# Patient Record
Sex: Male | Born: 1935
Health system: Southern US, Community
[De-identification: ages and names within clinical notes are randomized; demographics above are authoritative.]

## PROBLEM LIST (undated history)

## (undated) DIAGNOSIS — I639 Cerebral infarction, unspecified: Secondary | ICD-10-CM

## (undated) DIAGNOSIS — C9110 Chronic lymphocytic leukemia of B-cell type not having achieved remission: Secondary | ICD-10-CM

## (undated) DIAGNOSIS — C61 Malignant neoplasm of prostate: Secondary | ICD-10-CM

## (undated) DIAGNOSIS — R0602 Shortness of breath: Secondary | ICD-10-CM

## (undated) DIAGNOSIS — M1990 Unspecified osteoarthritis, unspecified site: Secondary | ICD-10-CM

## (undated) DIAGNOSIS — C439 Malignant melanoma of skin, unspecified: Secondary | ICD-10-CM

## (undated) DIAGNOSIS — I10 Essential (primary) hypertension: Secondary | ICD-10-CM

## (undated) DIAGNOSIS — E119 Type 2 diabetes mellitus without complications: Secondary | ICD-10-CM

## (undated) DIAGNOSIS — E7800 Pure hypercholesterolemia, unspecified: Secondary | ICD-10-CM

## (undated) HISTORY — DX: Type 2 diabetes mellitus without complications: E11.9

## (undated) HISTORY — PX: PROSTATE SURGERY: SHX751

## (undated) HISTORY — DX: Malignant melanoma of skin, unspecified: C43.9

## (undated) HISTORY — DX: Pure hypercholesterolemia, unspecified: E78.00

## (undated) HISTORY — DX: Shortness of breath: R06.02

## (undated) HISTORY — PX: BYPASS GRAFT: SHX909

## (undated) HISTORY — DX: Unspecified osteoarthritis, unspecified site: M19.90

## (undated) HISTORY — DX: Essential (primary) hypertension: I10

## (undated) HISTORY — DX: Malignant neoplasm of prostate: C61

## (undated) HISTORY — PX: CATARACT EXTRACTION: SUR2

## (undated) HISTORY — DX: Cerebral infarction, unspecified: I63.9

---

## 1999-05-19 ENCOUNTER — Encounter (INDEPENDENT_AMBULATORY_CARE_PROVIDER_SITE_OTHER): Payer: Self-pay | Admitting: *Deleted

## 1999-05-19 ENCOUNTER — Ambulatory Visit (HOSPITAL_BASED_OUTPATIENT_CLINIC_OR_DEPARTMENT_OTHER): Admission: RE | Admit: 1999-05-19 | Discharge: 1999-05-19 | Payer: Self-pay | Admitting: *Deleted

## 1999-05-25 ENCOUNTER — Encounter: Payer: Self-pay | Admitting: *Deleted

## 1999-05-25 ENCOUNTER — Inpatient Hospital Stay (HOSPITAL_COMMUNITY): Admission: RE | Admit: 1999-05-25 | Discharge: 1999-05-28 | Payer: Self-pay | Admitting: *Deleted

## 1999-06-23 ENCOUNTER — Encounter (INDEPENDENT_AMBULATORY_CARE_PROVIDER_SITE_OTHER): Payer: Self-pay | Admitting: *Deleted

## 1999-06-23 ENCOUNTER — Ambulatory Visit (HOSPITAL_BASED_OUTPATIENT_CLINIC_OR_DEPARTMENT_OTHER): Admission: RE | Admit: 1999-06-23 | Discharge: 1999-06-24 | Payer: Self-pay | Admitting: *Deleted

## 2006-11-21 ENCOUNTER — Ambulatory Visit: Payer: Self-pay | Admitting: Cardiovascular Disease

## 2006-12-06 ENCOUNTER — Encounter: Payer: Self-pay | Admitting: Cardiovascular Disease

## 2006-12-06 ENCOUNTER — Ambulatory Visit: Payer: Self-pay

## 2006-12-06 ENCOUNTER — Encounter: Payer: Self-pay | Admitting: Cardiology

## 2008-01-16 ENCOUNTER — Ambulatory Visit: Payer: Self-pay | Admitting: Vascular Surgery

## 2008-01-21 ENCOUNTER — Ambulatory Visit: Payer: Self-pay | Admitting: Vascular Surgery

## 2008-01-21 ENCOUNTER — Ambulatory Visit (HOSPITAL_COMMUNITY): Admission: RE | Admit: 2008-01-21 | Discharge: 2008-01-21 | Payer: Self-pay | Admitting: Vascular Surgery

## 2008-01-22 ENCOUNTER — Inpatient Hospital Stay (HOSPITAL_COMMUNITY): Admission: RE | Admit: 2008-01-22 | Discharge: 2008-01-25 | Payer: Self-pay | Admitting: Vascular Surgery

## 2008-01-23 ENCOUNTER — Ambulatory Visit: Payer: Self-pay | Admitting: Thoracic Surgery

## 2008-01-23 ENCOUNTER — Encounter: Payer: Self-pay | Admitting: Vascular Surgery

## 2008-02-22 ENCOUNTER — Ambulatory Visit: Payer: Self-pay | Admitting: Vascular Surgery

## 2008-04-16 ENCOUNTER — Ambulatory Visit: Payer: Self-pay | Admitting: Thoracic Surgery

## 2008-04-16 ENCOUNTER — Encounter: Admission: RE | Admit: 2008-04-16 | Discharge: 2008-04-16 | Payer: Self-pay | Admitting: Thoracic Surgery

## 2008-04-25 ENCOUNTER — Ambulatory Visit: Payer: Self-pay | Admitting: Vascular Surgery

## 2009-11-20 IMAGING — CT CT CHEST W/ CM
2 of 4 series · 15 of 36 positions shown, 18 images · IV contrast (APPLIED)
Comparison: No priors CT.  01/22/2008.

CLINICAL DATA: Lung nodule on preop chest x-ray

CT CHEST WITH CONTRAST
TECHNIQUE: Multidetector CT imaging of the chest was performed
following the standard protocol during bolus administration of
intravenous contrast.
Contrast: 80 ml [id number]

[Series 2: routine chest 5.0 st · axial · 0.68mm/px · z∈[-282,-42]mm · 12 of 57 slices shown, 15 images]
[im 5/57  mediastinal]
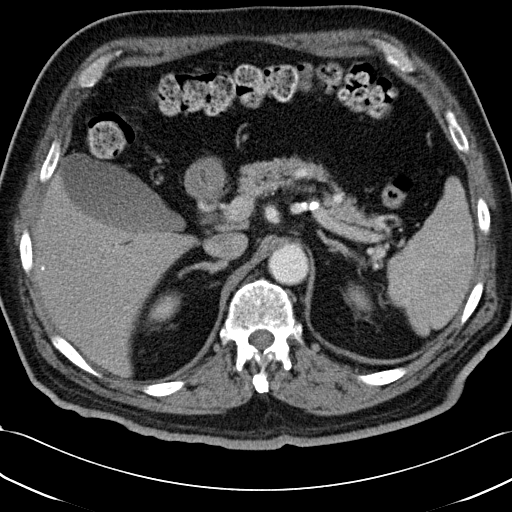
[im 5/57  lung]
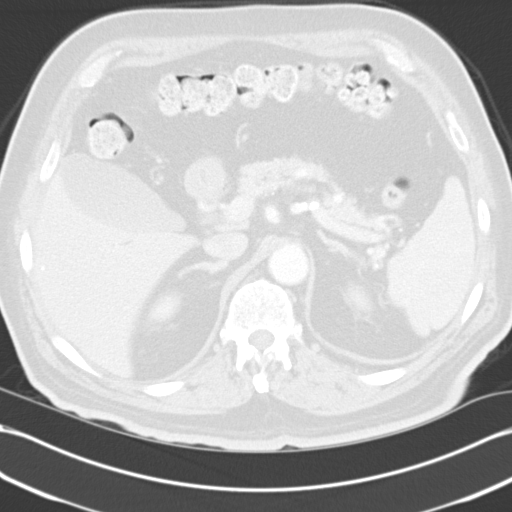
[im 9/57  lung]
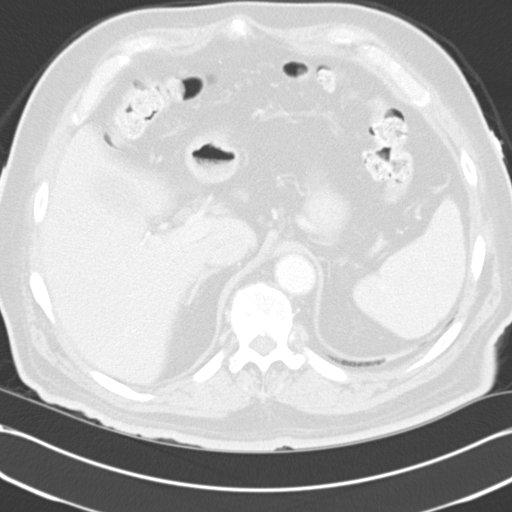
[im 13/57  lung]
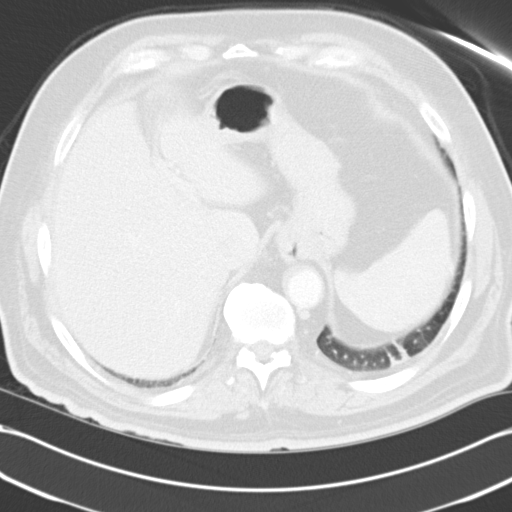
[im 17/57  lung]
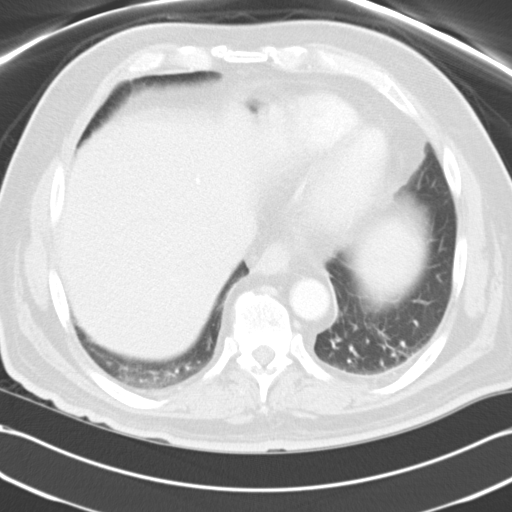
[im 21/57  mediastinal]
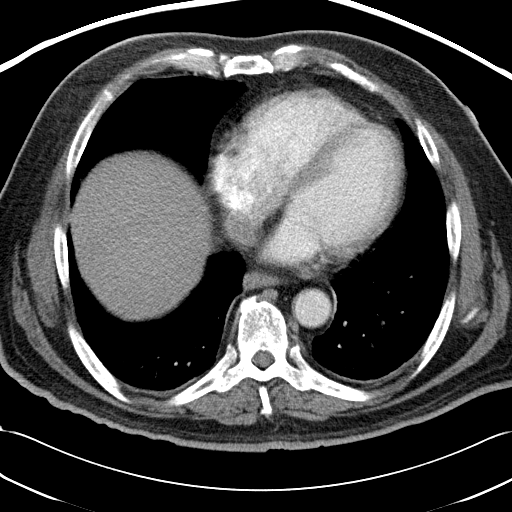
[im 21/57  lung]
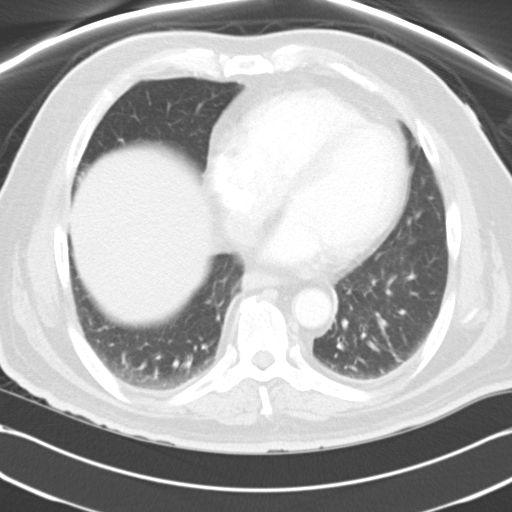
[im 25/57  lung]
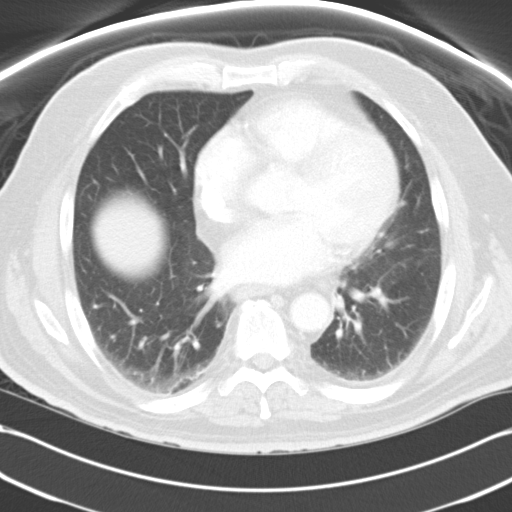
[im 33/57  lung]
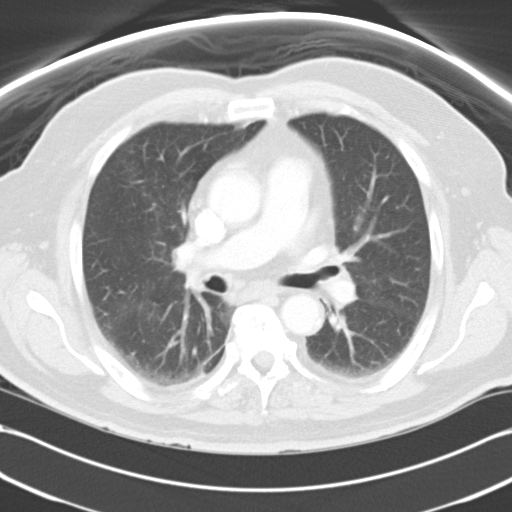
[im 37/57  lung]
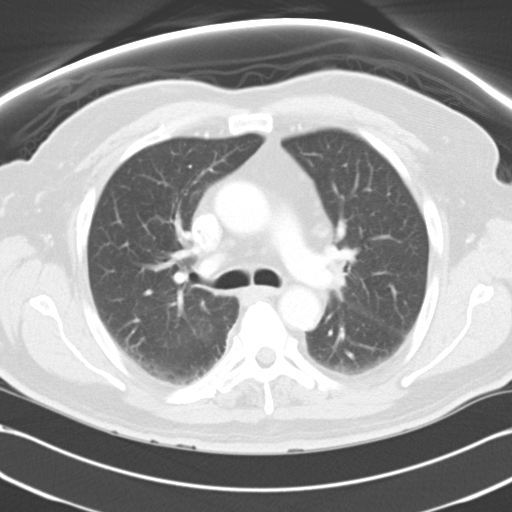
[im 41/57  mediastinal]
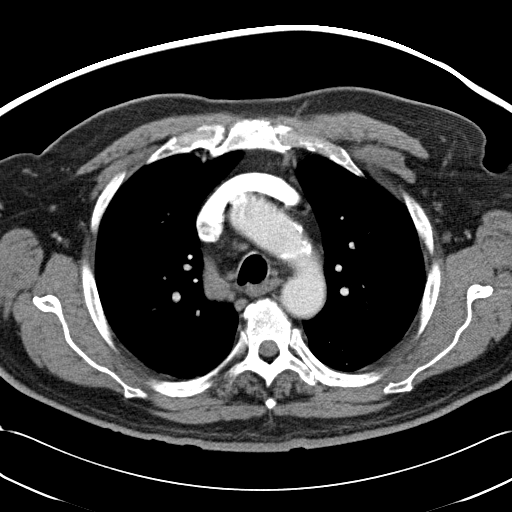
[im 41/57  lung]
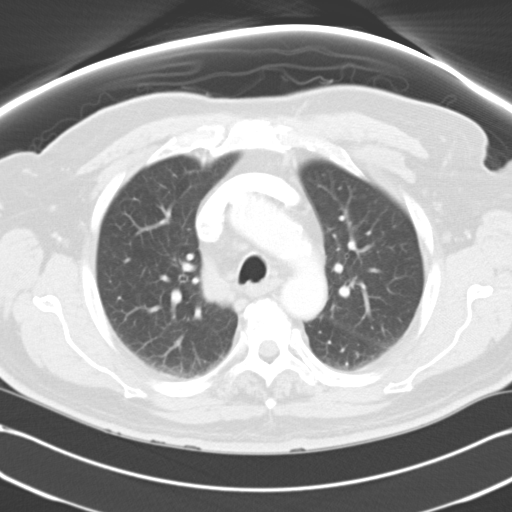
[im 45/57  lung]
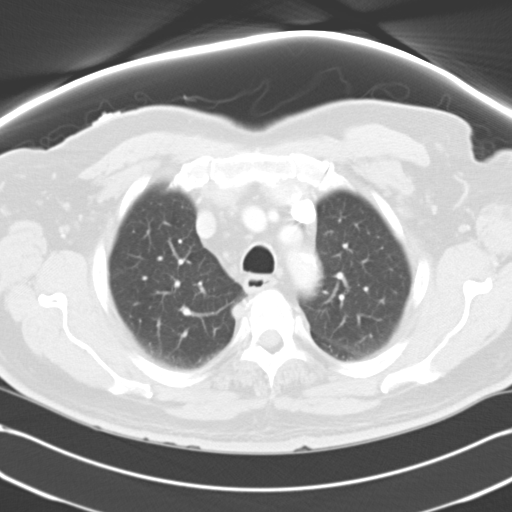
[im 49/57  lung]
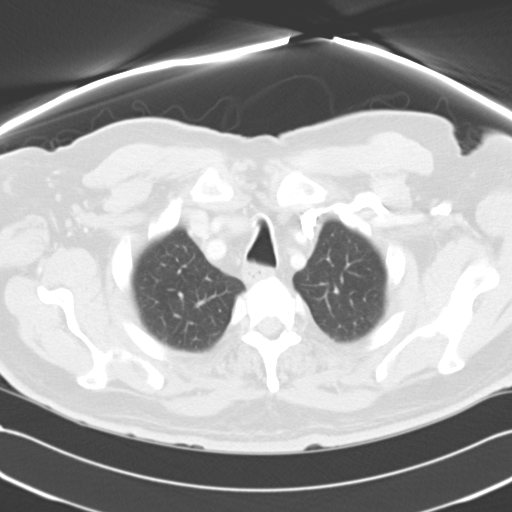
[im 53/57  lung]
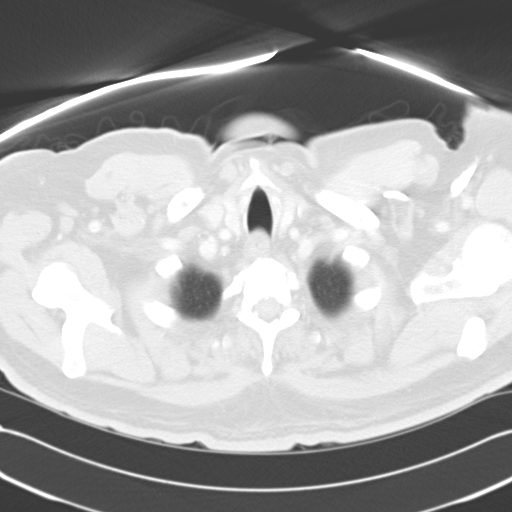

[Series 5: routine chest 2.0 st · coronal · 0.66mm/px · 3 of 106 slices shown]
[im 22/106  lung]
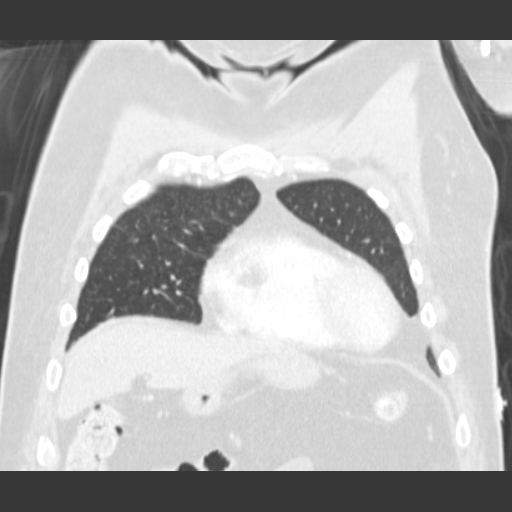
[im 43/106  lung]
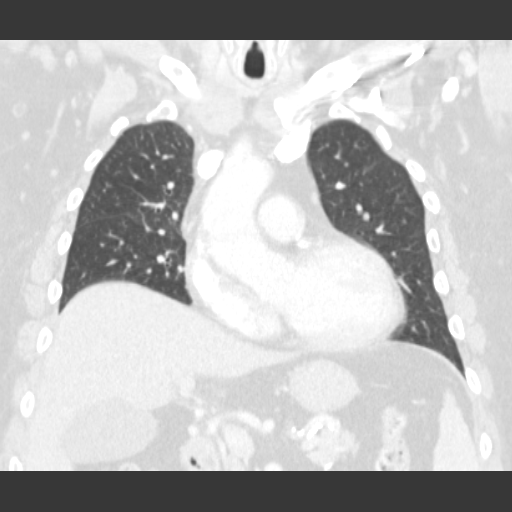
[im 64/106  lung]
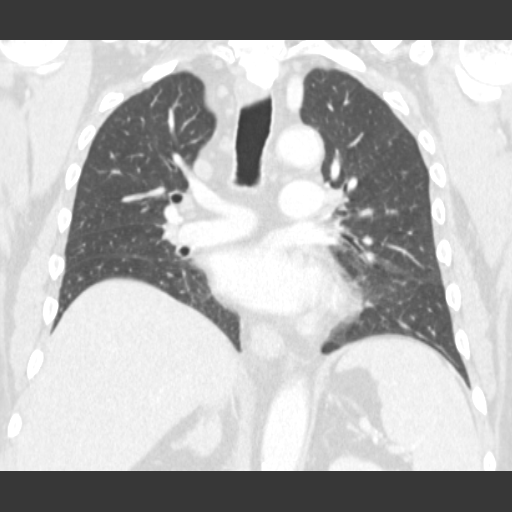

[15 of 36 positions shown; findings below may reference images not displayed]

FINDINGS: There is no 10 mm nodule correlating with the chest x-ray
findings.  There is, however, and 4 mm nodule in the superior
segment of the right lower lobe, seen on image number 27 of series
3.  In the sagittal plane, this is seen abutting the major fissure.
This is lower in position than the abnormality of the chest x-ray.
There are no other lung nodules.  There are some scattered sub
centimeter mediastinal nodes.  None are significantly enlarged.  No
pleural or pericardial fluid.  There are calcifications in the
coronary arteries and aorta.  There are degenerative changes of the
spine without skeletal lesions.

Presence of  the 4 mm lung nodule in the right lower lobe does
require follow-up.  If the patient is low risk for malignancy,
follow-up CT is recommended in 1 year.  If the patient is higher
risk for malignancy, follow-up is recommended at 6 months. The
follow-up study can be done without contrast.
IMPRESSION: 1.  There is no evidence for a 10 mm lung nodule as was questioned
on the recent chest x-ray.
2.  There is a 4 mm lung nodule in the superior segment of the
right lower lobe that does require follow-up, as outlined above.
3.  No other significant findings.

## 2010-03-08 ENCOUNTER — Encounter: Payer: Self-pay | Admitting: Thoracic Surgery

## 2010-06-29 NOTE — Assessment & Plan Note (Signed)
[facility]                            CARDIOLOGY OFFICE NOTE   MAGNUM, LUNDE                    MRN:          [id number]  DATE:11/21/2006                            DOB:          1928-05-10    [name] is a delightful 77 year old patient referred by Dr. Jacalyn Lefevre for an abnormal EKG and episode of chest pain or dyspnea and  hypercholesterolemia.   The patient is a longstanding diabetic for since at least 1993 or 1994.  He is a previous cigar smoker with hypercholesterolemia and  hypertension.  He has not had a previous cardiac workup.  The patient  had an episode about a month ago while he was sitting down watching TV  of pressure in his chest and shortness of breath.  The symptoms lasted  for approximately an hour. They resolved spontaneously.  He has not had  a recurrence.  The patient is fairly sedentary, but in general does not  get exertional dyspnea or chest pain.  He has not had any significant  palpitations or syncope.  He has also noted to have an abnormal EKG.  The patient is unaware of any previous cardiac problems, any previous  murmur or abnormal ECGs.   Despite being a previous cigar smoker, he does not carry a diagnosis of  COPD.  I do not have recent PFTs on him.  His diabetes has been under  good control.  His hemoglobin A1cs have been in the 6 range.  He has  been compliant with his blood pressure pills.  His primary symptom a  month ago was shortness of breath, although he did have a little  pressure in the center part of his chest.  There was no associated  diaphoresis or radiation.  It did not recur.  It has not been  progressive.  He does have some arthralgias that tend to occur in his  knee and ankle joints and this was distinctly different.   The patient's review of systems is otherwise negative.   His past medical history is remarkable for being on chronic Coumadin and  apparently he has had 2 previous  strokes and sees Dr. Sandria Manly on a regular  basis.  It is not clear to me why he is on long-term Coumadin; the  strokes do not necessarily sound ischemic or embolic.  His initial  episode 10-14 years ago involved an episode while he was drinking  alcohol, where he apparently lost his vision for a short period of time.  I told him I would follow up with Dr. Sandria Manly in regards to the followup of  this and need for chronic Coumadin.  He is also a diabetic, on insulin.  He has hypertension, has arthritis and takes Celebrex.   His past medical history is otherwise benign.   FAMILY HISTORY:  Remarkable for his father dying at age 75 of a blood  clot and mother dying at age 75 of a stroke.   SOCIAL HISTORY:  The patient is happily married.  He has 4 children that  are grown.  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  He is fairly sedentary.  He does not  smoke cigars since 1977.  He does not drink any more either.   CURRENT MEDICATIONS:  1. Lantus 10 units subcu every morning.  2. NovoLog 10 units in the morning as well.  3. Coumadin as directed, followed by Dr. Sandria Manly.  4. Glucotrol 10 mg a day.  5. Metformin 500 mg a day.  6. Lisinopril/hydrochlorothiazide 20/25.  7. Celebrex.   ALLERGIES:  CODEINE and TAPE.   PREVIOUS SURGICAL HISTORY:  1. Prostatectomy in 1994.  2. Finger removal for skin cancer on the right hand in 1998.   PHYSICAL EXAMINATION:  GENERAL:  His exam is remarkable for an elderly  white male in no distress.  His speech is somewhat halting.  Affect is  appropriate.  His blood pressure is 140/80.  His pulse is 68 and regular.  Respiratory  rate is 14.  He is afebrile.  His weight is 220.  HEENT:  Unremarkable.  NECK:  Supple.  There are no carotid bruits, no lymphadenopathy, no  thyromegaly and JVP elevation.  LUNGS:  Clear with diaphragmatic motion and no wheezing.  HEART:  Sounds are normal with an S1 and S2.  PMI is  normal.  ABDOMEN:  Benign.  Bowel sounds are positive.  No AAA.  No tenderness.  No hepatosplenomegaly.  No hepatojugular reflux.  Femorals are +3  bilaterally without bruit.  PTs are +2.  There is no lower extremity  edema.  He has had an amputation of the 4th digit on the right hand.  NEUROLOGIC:  Nonfocal.  There is no muscular weakness.  SKIN:  Warm and dry.   EKG:  Abnormal, sinus rhythm, left axis deviation, right bundle branch  block and a fairly pronounced first-degree block with a P-R interval of  324.   I have reviewed his records from [facility] including lab  work.  As indicated, his hemoglobin A1c has been in the 6 range.  He has  normal kidney function and no evidence of anemia.   IMPRESSION:  1. Episode of shortness of breath, unlikely to be anginal equivalent;      however, he has longstanding insulin-dependent diabetes and an      abnormal EKG.  He will be referred for stress Myoview.  I did      ambulate him in the halls today and I think he can walk, despite      his arthralgias.  2. Abnormal EKG showing trifascicular block.  He is not on any      atrioventricular nodal blocking drugs.  He has not had palpitations      or syncope.  All atrioventricular nodal blocking drugs including      calcium blockers, beta blockers and digoxin should be avoided.      This will have to be followed long-term.  I suspect it is a primary      electrical problem, but given his diabetes and episode of dyspnea,      we need to rule out an occult anterior wall myocardial infarction.      In light of this, he will also have a 2-D echocardiogram to assess      for previous silent myocardial infarction and to assess his      dyspnea.  3. Hypercholesterolemia.  Continue TriCor.  The patient initially      thought that the TriCor was related to  his shortness of breath.  I      do not have his full laboratory work, but being a diabetic, he      certainly probably is  hypertriglyceridemic and can continue TriCor;      it is not related to his breathing problem.  4. Hypertension, currently well controlled.  Given his diabetes, I      would continue his ACE inhibitor and continue low-salt diet.  5. Previous cigar smoking.  Continue abstinence.  No evidence of oral      cancer.  Follow up with primary care medical doctor.   Further recommendations will be based on the results of his Myoview and  echocardiogram.  As long as these are low risk, I will see him back in a  year.  Again, I would continue to be very vigilant about his conduction  system, since he does have trifascicular block.     Noralyn Pick. Eden Emms, MD, [facility]  Electronically Signed    PCN/MedQ  DD: 11/21/2006  DT: 11/22/2006  Job #: [id number]   cc:   Bertram Millard. Hyacinth Meeker, M.D.

## 2010-06-29 NOTE — Discharge Summary (Signed)
NAMEKIRKE, BREACH NO.:  [id number]   MEDICAL RECORD NO.:  [id number]          PATIENT TYPE:  INP   LOCATION:  2011                         FACILITY:  [facility]   PHYSICIAN:  Larina Earthly, M.D.    DATE OF BIRTH:  [birth year]   DATE OF ADMISSION:  01/22/2008  DATE OF DISCHARGE:  01/25/2008                               DISCHARGE SUMMARY   ADDENDUM   Mr. Bartoszek was found to have a solitary pulmonary nodule.  He has been  referred to Dr. Edwyna Shell, and Dr. Edwyna Shell is working to assess at present.  He is to have a followup CT scan in 3 months and Dr. Edwyna Shell will make  arrangements for his appointment at his office.      Wilmon Arms, PA      Larina Earthly, M.D.  Electronically Signed    KEL/MEDQ  D:  01/25/2008  T:  01/25/2008  Job:  [id number]

## 2010-06-29 NOTE — Op Note (Signed)
NAMEZIYAN, SCHOON             ACCOUNT NO.:  [id number]   MEDICAL RECORD NO.:  [id number]          PATIENT TYPE:  AMB   LOCATION:  SDS                          FACILITY:  [facility]   PHYSICIAN:  Di Kindle. Edilia Bo, M.D.DATE OF BIRTH:  [birth year]   DATE OF PROCEDURE:  DATE OF DISCHARGE:  01/21/2008                               OPERATIVE REPORT   PREOPERATIVE DIAGNOSIS:  Nonhealing wound on the left third toe with  infrainguinal arterial occlusive disease.   POSTOPERATIVE DIAGNOSIS:  Nonhealing wound on the left third toe with  infrainguinal arterial occlusive disease.   PROCEDURES:  1. Aortogram.  2. Bilateral iliac arteriogram.  3. Bilateral lower extremity runoff.  4. Ultrasound-guided access to the right common femoral artery.  5. Selective catheterization of the left external iliac artery.   SURGEON:  Di Kindle. Edilia Bo, MD   ANESTHESIA:  Local.   TECHNIQUE:  The patient was taken to the PV Lab and the groins were  prepped and draped in the usual sterile fashion.  After the skin was  infiltrated with 1% lidocaine and under ultrasound guidance, the right  common femoral artery was cannulated and a guidewire introduced into the  infrarenal aorta under fluoroscopic control.  A 5-French sheath was  introduced over the wire.  Pigtail catheter was positioned at the L1  vertebral body and flush aortogram obtained.  The catheter was then  repositioned above the aortic bifurcation and the catheter was exchanged  for an IMA catheter, which was positioned into the left common iliac  artery.  A Wholey wire was advanced down into the left common femoral  artery and then the IMA catheter exchanged for an end-hole catheter.  Selective left external iliac arteriogram was obtained with left lower  extremity runoff.  Additional spot films obtained of the left leg.  The  end-hole catheter was then removed and additional right lower extremity  films obtained to the right femoral  sheath.   FINDINGS:  There are single renal arteries bilaterally with no  significant renal artery stenosis identified.  The infrarenal aorta is  widely patent.  The common iliac, external iliac, and hypogastric  arteries are patent bilaterally.   On the left side, common femoral, proximal superficial femoral, and deep  femoral arteries are patent.  There is a short focal 80% stenosis in the  proximal left superficial femoral artery at the junction of the proximal  and middle third of the thigh.  The superficial femoral artery below  this is patent as is the popliteal artery.  The popliteal artery below  the knee is then occluded with some moderate disease of the below-knee  popliteal artery.  The anterior tibial, tibioperoneal trunk, and  proximal peroneal and posterior tibial arteries were occluded.  The  entire anterior tibial artery is occluded.  The peroneal artery  reconstitutes in the proximal third of the leg and it is patent to the  ankle.  There is reconstitution of the posterior tibial artery and the  distal third of the leg with some mild disease in the distal posterior  tibial artery and the foot.   On  the right side, the common femoral, superficial femoral, and deep  femoral arteries are patent.  The popliteal artery is occluded below the  knee.  All three tibials were occluded except the peroneal reconstitutes  proximally and then there is collateral filling of the dorsalis pedis  artery on the right.   CONCLUSIONS:  1. No significant aortoiliac occlusive disease.  2. An 80% left superficial femoral artery stenosis, which is local.  3. Severe tibial occlusive disease as described above.      Di Kindle. Edilia Bo, M.D.  Electronically Signed     CSD/MEDQ  D:  01/21/2008  T:  01/21/2008  Job:  [id number]   cc:   Fanny Bien. Tuchman, D.P.M.  Bertram Millard. Hyacinth Meeker, M.D.  Larina Earthly, M.D.

## 2010-06-29 NOTE — Consult Note (Signed)
Brandon Chambers, Brandon Chambers NO.:  [id number]   MEDICAL RECORD NO.:  [id number]          PATIENT TYPE:  INP   LOCATION:  2011                         FACILITY:  [facility]   PHYSICIAN:  Ines Bloomer, M.D. DATE OF BIRTH:  1928/05/23   DATE OF CONSULTATION:  DATE OF DISCHARGE:                                 CONSULTATION   CHIEF COMPLAINT:  Lung nodule.   This 77 year old diabetic was having a nonhealing ulcer of his left  third toe, was seen by Dr. Leeanne Deed, and was referred to Dr. Arbie Cookey and  had surgery of left popliteal tibial bypass with debridement of the left  third toe, was placed on antibiotics.  A preop chest x-ray showed a  right upper lobe nodule apparently back in 2001 when Dr. Metro Kung  amputated a right fourth finger for some kind of a malignant tumor.  A  chest x-ray showed a questionable nodule in the right upper lobe with  calcifications.  He quit smoking in 1977.  He has had no hemoptysis,  fever, chills, excessive sputum.   His past medical history is significant for diabetes.   He has allergies to CODEINE.   His medications include lisinopril.  He was on Coumadin but that has  been stopped since he has been in the hospital, insulin Lantus, NovoLog.  He also has taken Augmentin, Celebrex, hydrochlorothiazide, Lovenox.  His Coumadin is being restarted.   Family history is noncontributory.   SOCIAL HISTORY:  He is married.  He has four children.  Retired.  Quit  smoking in 1977.  Does not drink alcohol.   REVIEW OF SYSTEMS:  He is 214 pounds, he is 6 feet 4 inches.  CARDIAC:  No angina or atrial fibrillation.  PULMONARY:  See history of present  illness.  No hemoptysis.  GI:  No nausea, vomiting, constipation, or  diarrhea.  GU:  No dysuria or frequent urination.  VASCULAR:  See  history of present illness.  No DVT or TIAs.  MUSCULOSKELETAL:  He has  got degenerative arthritis.  HEMATOLOGIC:  No problems with bleeding,  clotting disorders, or  anemia, although he is on Coumadin.  EYE/ENT:  No  changes in eyesight or hearing.  PSYCHIATRIC:  No problems with  nerves,  depression or other psychiatric illnesses.   PHYSICAL EXAMINATION:  GENERAL:  He is a well-developed Caucasian male  in no acute distress.  VITAL SIGNS:  Blood pressure is 120/70, pulse 70, respirations 18, sats  were 94%.  HEAD, EYES, EARS, NOSE, and THROAT:  Unremarkable.  NECK:  Supple without thyromegaly.  There is no supraclavicular or  axillary adenopathy.  CHEST:  Clear to auscultation and percussion.  HEART:  Regular sinus rhythm, no murmurs.  ABDOMEN:  Soft.  There is no hepatosplenomegaly.  Bowel sounds are  normal.  EXTREMITIES:  Pulses are 2+.  There is no clubbing or edema.  His left  leg has surgical incisions, and there is __________.  Also on the right,  fourth finger is surgically removed as well healed amputation.  NEUROLOGIC:  He is oriented x3.  Sensory and motor intact.  Cranial  nerves intact.   IMPRESSION:  1. Right upper lobe nodule probably granuloma.  2. Diabetes mellitus, insulin-dependent.  3. Hypertension.  4. Ulceration, left foot, left third toe with popliteal tibial bypass      and debridement.   PLAN:  CT scan and possible PET scan.      Ines Bloomer, M.D.  Electronically Signed     DPB/MEDQ  D:  01/23/2008  T:  01/23/2008  Job:  [id number]

## 2010-06-29 NOTE — Assessment & Plan Note (Signed)
OFFICE VISIT   Brandon Chambers, Brandon Chambers  DOB:  May 03, 1928                                       02/22/2008  ZOXWR#:[id number]   The patient presents today for follow-up of his left below-knee  popliteal-to-peroneal bypass with reversed great saphenous vein.  This  was on January 22, 2008.  He had infection in his third toe.  He did  well in the hospital and was discharged to home.  He has healed all of  his surgical incisions and his third toe looks quite good as well.  He  has biphasic perineal and anterior tibial and posterior tibial signals.  His foot is well-perfused.  He does have the usual amount of  postoperative swelling.  I discussed continued recovery with the  patient.  He will see [place] again in our vascular lab protocol.  He does  have a known focal superficial femoral artery stenosis as well and we  will continue to follow this as well.  This would be amenable to  angioplasty, but did not appear to be flow-limiting since he has a  normal popliteal pulse.   Larina Earthly, M.D.  Electronically Signed   TFE/MEDQ  D:  02/22/2008  T:  02/25/2008  Job:  [id number]   cc:   Bertram Millard. Hyacinth Meeker, M.D.  Richard C. Tuchman, D.P.M.

## 2010-06-29 NOTE — Discharge Summary (Signed)
Brandon Chambers, Brandon Chambers NO.:  [id number]   MEDICAL RECORD NO.:  [id number]          PATIENT TYPE:  INP   LOCATION:  2011                         FACILITY:  [facility]   PHYSICIAN:  Larina Earthly, M.D.    DATE OF BIRTH:  October 01, 1928   DATE OF ADMISSION:  01/22/2008  DATE OF DISCHARGE:  01/25/2008                               DISCHARGE SUMMARY   DISCHARGE DIAGNOSES:  1. Left foot ischemia with poorly healing left third toe.  2. Peripheral vascular disease.  3. Diabetes mellitus.  4. High blood pressure.  5. Dyslipidemia.   PROCEDURE PERFORMED:  Left below-knee popliteal to peroneal bypass using  reversed greater saphenous vein by Dr. Arbie Cookey on January 22, 2008.   COMPLICATIONS:  None.   CONDITION ON DISCHARGE:  Stable and improving.   DISCHARGE MEDICATIONS:  1. NovoLog 12 units subcu q.a.m.  2. Lantus insulin 32 units subcu q.p.m.  3. Lisinopril and hydrochlorothiazide 20/25 p.o. daily.  4. Amoxicillin 875/125 mg p.o. b.i.d.  5. Coumadin 5 mg p.o. daily.  6. Celebrex 200 mg p.o. daily.  7. He is given a prescription for Percocet 5/325 one p.o. q.4 h.      p.r.n. pain.   DISPOSITION:  He is being discharged home in stable condition with his  wounds healing well.  He is given careful instructions regarding care of  his wounds and his activity level.  He is instructed to have his blood  checked on Monday and his Coumadin will be adjusted at that time.  He is  to see Dr. Arbie Cookey in 2 weeks with ABI's and for followup.   BRIEF IDENTIFYING STATEMENT:  For complete details, please refer to the  typed history and physical.  Briefly, this very pleasant 77 year old  gentleman was referred to Dr. Arbie Cookey with a nonhealing left-third toe  ulceration with slight erythema.  Dr. Arbie Cookey evaluated him and found him  to have high-grade stenosis throughout his iliac system and less at the  superficial femoral artery.  There was good occlusion of all tibial  vessels with distal  reconstitution of the peroneal artery at mid calf,  which rendered collateral flow to the posterior tibial artery.  Dr.  Arbie Cookey recommended a popliteal to below-knee peroneal bypass.  [name] was informed of the risks and benefits of the procedure and  after careful consideration, he elected to proceed with surgery.   HOSPITAL COURSE:  Preoperative workup was completed as an outpatient.  He was brought in through same-day surgery and underwent the  aforementioned revascularization procedure.  For complete details,  please refer the typed operative report.  The procedure was without  complications.  He was returned to the Post Anesthesia Care Unit  extubated.  Following stabilization, he was transferred to a bed on a  surgical convalescent floor.  His diet and activity level were advanced  as tolerated.  On January 25, 2008, he was evaluated and was desirous  of discharge.  He was found to be stable and was subsequently discharged  home.       Wilmon Arms, PA  Larina Earthly, M.D.  Electronically Signed    KEL/MEDQ  D:  01/25/2008  T:  01/25/2008  Job:  [id number]   cc:   Larina Earthly, M.D.

## 2010-06-29 NOTE — Letter (Signed)
April 16, 2008   Larina Earthly, MD  [address]  [place], [place] [address]   Re:  Brandon Chambers, Brandon Chambers             DOB:  08-17-1928   Dear Tawanna Cooler,   The patient came for followup today for this 4-mm right lower lobe  nodule.  We got a CT scan on him and it was unchanged.  He is doing well  from the standpoint of his leg.  His blood pressure was 146/83, pulse  62, respirations 18, and sats were 95%.  I will see him back again in 4  weeks with a chest x-ray.   Ines Bloomer, M.D.  Electronically Signed   DPB/MEDQ  D:  04/16/2008  T:  04/16/2008  Job:  [id number]

## 2010-06-29 NOTE — Procedures (Signed)
BYPASS GRAFT EVALUATION   INDICATION:  Left popliteal-to-peroneal artery bypass graft.   HISTORY:  Diabetes:  Yes.  Cardiac:  No.  Hypertension:  Yes.  Smoking:  Former smoker.  Previous Surgery:  Left pop-to-peroneal artery bypass graft with reverse  greater saphenous vein by Dr. Arbie Cookey on January 22, 2008.   SINGLE LEVEL ARTERIAL EXAM                               RIGHT              LEFT  Brachial:                    158                158  Anterior tibial:             >300               160  Posterior tibial:                               160  Peroneal:                    >300  Ankle/brachial index:        Non-compressible   >1.0   PREVIOUS ABI:  Date: Preop  RIGHT:  Non-compressible  LEFT:  >1.0   LOWER EXTREMITY BYPASS GRAFT DUPLEX EXAM:   DUPLEX:  1. Toe pressure right 76 mmHg, left 50 mmHg.  2. Doppler arterial waveforms are biphasic proximal to the bypass      graft and monophasic within and distal to the bypass graft.   IMPRESSION:  1. Right ABI was not calculated secondary to non-compressible vessels.  2. Left ABI was stable to previous study, however may be falsely      elevated secondary to medial calcification.  3. Patent left popliteal to peroneal artery bypass graft.   ___________________________________________  Larina Earthly, M.D.   MC/MEDQ  D:  04/25/2008  T:  04/25/2008  Job:  [id number]

## 2010-06-29 NOTE — Consult Note (Signed)
NEW PATIENT CONSULTATION   Brandon Chambers, Brandon Chambers  DOB:  1928-06-25                                       01/16/2008  HYQMV#:[id number]   The patient presents today for evaluation of left third toe ulceration.  He is a very pleasant 77 year old diabetic gentleman with a several week  history of nonhealing ulcer of his left third toe.  He has seen Dr.  Leeanne Deed who has had appropriate local care and I am seeing him for  further evaluation with concern regarding arterial insufficiency.  He  does not have any prior history of claudication or tissue loss.  He did  have some nonhealing ulcerations over the plantar aspect of his foot  several years ago which eventually recovered.  He recalls having  noninvasive studies at that time revealing mild to moderate  insufficiency.  He does not have any specific pain related to the  ulceration currently.  He has been on antibiotics.   PAST MEDICAL HISTORY:  Significant for diabetes for approximately 15  years, does not have any cardiac history and does not have any history  of renal insufficiency.   SOCIAL HISTORY:  He is married with four children.  He is retired.  He  quit smoking 32 years ago.  He does not drink alcohol.   REVIEW OF SYSTEMS:  His weight is reported at 214 pounds.  He is 6 feet  tall.  He has no history of cardiac, pulmonary, GU or GI difficulties.  He does have a history of arthritis.   ALLERGIES:  Codeine.   MEDICATION:  Lisinopril, Coumadin, insulin, Lantus, NovoLog.  He is also  on an oral antibiotic for the last several days.  His Coumadin is due to  prior posterior circulation stroke and this is regulated by Dr. Sandria Manly and  he reports it is typically between 2 and 3 INR.   PHYSICAL EXAMINATION:  General:  A well-developed, well-nourished white  male appearing stated age of 9.  Vital signs:  Blood pressure 127/67,  pulse 87, respirations 18, temperature is 98.4.  He is grossly intact  neurologically.   His radial pulses are 2+.  He has 2+ femoral, 2+  popliteal pulses.  On the right he has a 1+ dorsalis pedis pulse and no  posterior tibial pulse.  On the left he does not have a palpable  posterior tibial pulse or anterior tibial pulse.  He does have erythema  extending up onto the dorsum of his foot and has ulceration over the tip  of his third toe and also at the distal joints.   He underwent noninvasive vascular laboratory studies in our office today  and this reveals a monophasic flow in his left tibial vessels and  biphasic flow in his right tibial vessels.  He does have calcified  vessels making his ankle arm index unreliable.  I discussed this at  length with the patient.  I feel that he does have limb threatening  level of ischemia.  He is on Coumadin and therefore he will stop this  after today's dose and will undergo arteriography on Monday.  He  understands that he in all likelihood will require revascularization for  improvement of flow to be able to heel toe amputation.  We will make  further recommendations pending his arteriogram on Monday.   Larina Earthly, M.D.  Electronically Signed   TFE/MEDQ  D:  01/16/2008  T:  01/17/2008  Job:  [id number]   cc:   Fanny Bien. Tuchman, D.P.M.  Bertram Millard. Hyacinth Meeker, M.D.

## 2010-06-29 NOTE — Op Note (Signed)
NAMEDAVIAN, HANSHAW NO.:  [id number]   MEDICAL RECORD NO.:  [id number]          PATIENT TYPE:  INP   LOCATION:  2011                         FACILITY:  [facility]   PHYSICIAN:  Larina Earthly, M.D.    DATE OF BIRTH:  Apr 03, 1928   DATE OF PROCEDURE:  01/22/2008  DATE OF DISCHARGE:                               OPERATIVE REPORT   PREOPERATIVE DIAGNOSIS:  Left foot ischemia with poorly healing left  third toe.   POSTOPERATIVE DIAGNOSIS:  Left foot ischemia with poorly healing left  third toe.   PROCEDURE:  Left below-knee popliteal-to-peroneal bypass with reversed  great saphenous vein.   SURGEON:  Larina Earthly, MD   ASSISTANT:  Wilmon Arms, PA-C   ANESTHESIA:  General endotracheal.   COMPLICATIONS:  None.   DISPOSITION:  To recovery room, stable.   INDICATIONS FOR PROCEDURE:  The patient is a 77 year old gentleman who  presented last week to my office with nonhealing left third toe  ulcerations and erythema of his foot.  He had been placed on antibiotics  and Coumadin was discontinued at that time and he underwent  arteriography the day prior to this procedure.  He did have 2 to 3+  popliteal pulse on the left and no pedal pulses.  His arteriogram  revealed patency throughout his iliac system.  His left superficial  femoral artery and popliteal artery were patent.  He did have a high-  grade focal stenosis in the mid superficial femoral artery.  There was  complete occlusion of all tibial vessels with reconstitution of the  peroneal artery and mid calf, which then gave collateral flow into the  posterior tibial artery.  I discussed this at length with Mr. Sweeney  and recommended below-knee popliteal-to-peroneal bypass.  I did explain  that he did have a stenosis of the superficial femoral artery with a  palpable below-knee popliteal pulse, so that he had adequate inflow for  pop-peroneal bypass.  I did explain the option for angioplasty but  concerned about early recurrence threatening his bypass.  He understands  and wished to proceed.  Also of note, the preoperative arteriogram was  found to have a right mid lung 1-cm nodule.  I did discuss this with Mr.  Rolfson and his family in the holding area since his preoperative x-ray  was the morning of the procedure.  I explained that we would work this  up further after his limb-threatening ischemia was resolved.   PROCEDURE IN DETAIL:  The patient was taken to the operating room and  placed in supine position.  The area of the left groin and left leg  prepped and draped in the usual sterile fashion.  Using ultrasound, the  level of the saphenous vein was identified and was of good caliber.  Incision was made over the mid calf over the saphenous vein and the  incision was continued from mid-to-distal calf to the above-knee  position.  The vein was of excellent caliber.  Tributary branches were  ligated with 3-0 and 4-0 silk ties and divided.  The fascia was opened  at the  medial aspect of the calf through the same incision, carried down  to isolate the popliteal artery in the below-knee position.  Dissection  was carried up under the knee and the artery had mild-to-moderate  atherosclerotic changes but had normal pulse.  The gastrocnemius muscle  was reflected posteriorly to give exposure of the posterior tibial and  peroneal arteries.  The peroneal artery was very calcified but did have  a flow lumen.  The peroneal vein was dissected away from the peroneal  artery.  The saphenous vein was harvested by ligating it proximally,  distally, and dividing it.  The vein was gently dilated and was of  excellent caliber.  The patient was given 9000 units of intravenous  heparin.  After adequate circulation time, the popliteal artery was  occluded proximally, distally, and was opened with 11 blade extended  longitudinally using Potts scissors.  The vein was reversed, spatulated,  and sewn  end-to-side with popliteal artery with a running 6-0 Prolene  suture.  The anastomosis was tested and found to be adequate.  There was  excellent flow through the vein graft.  The vein was then brought into  approximation of the peroneal artery.  The Webril pneumatic tourniquet  were placed in the mid thigh on the left.  The leg was elevated and  exsanguinated with Esmarch tourniquet, and the pneumatic tourniquet was  inflated to 250 mmHg.  The peroneal artery was opened, and there  continued to be some flow in the peroneal artery despite the tourniquet.  For this reason, the peroneal artery was occluded proximally and  distally with serrefine clamps.  The peroneal artery was opened  longitudinally and did have a 2-mm flow channel that was moderately  atherosclerotic.  The saphenous vein graft was cut to the appropriate  length, was spatulated, sewn end-to-side with the peroneal artery with a  running 6-0 Prolene suture.  Prior to completion, a dilator passed  easily through the distal anastomosis.  The anastomosis was then  completed.  After the usual flushing maneuvers, the pneumatic tourniquet  was deflated and excellent graft-dependent flow was noted at the foot at  the level of the posterior tibial artery.  The patient was given 50 mg  of protamine to reverse heparin.  It was irrigated with saline,  electrocautery wounds were closed with 2-0 Vicryl and the fascia and the  skin were closed with 3-0 subcuticular Vicryl stitch.  Benzoin and Steri-  Strips were applied.  The patient was taken to the recovery room in  stable condition.      Larina Earthly, M.D.  Electronically Signed     TFE/MEDQ  D:  01/22/2008  T:  01/23/2008  Job:  [id number]   cc:   Fanny Bien. Tuchman, D.P.M.  Bertram Millard. Hyacinth Meeker, M.D.

## 2010-07-02 NOTE — Op Note (Signed)
[place]. [facility]  Patient:    Brandon Chambers, Brandon Chambers                    MRN: [id number] Proc. Date: 06/23/99 Adm. Date:  [id number] Disc. Date: [id number] Attending:  Kendell Bane CC:         Genene Churn. Love, M.D.                           Operative Report  PREOPERATIVE DIAGNOSIS:  Squamous cell carcinoma, right ring finger, with inadequate margins, status post distal interphalangeal disarticulation, complicated by flexor tendon sheath infection.  POSTOPERATIVE DIAGNOSIS:  Squamous cell carcinoma, right ring finger, with inadequate margins, status post distal interphalangeal disarticulation, complicated by flexor tendon sheath infection.  PROCEDURE:  Ray resection, right ring finger.  SURGEON:  Lowell Bouton, M.D.  ANESTHESIA:  General.  OPERATIVE FINDINGS:  The patient had very thick edematous soft tissues secondary to his previous flexor sheath infection.  The area of inadequate margin was not disturbed.  Cultures were obtained in the palm during the procedure.  DESCRIPTION OF PROCEDURE:  Under general anesthesia, with a tourniquet on the right arm, the right hand was prepped and draped with Betadine scrub and paint and after elevating the limb, the tourniquet was inflated to 250 mmHg.  A V-shaped incision was made longitudinally on the dorsum of the fourth metacarpal back to the base.  The skin incision was then carried distally around the side of the ring finger into the palm in a V-shaped fashion back to the A1 pulley area.  Blunt dissection was carried through the subcutaneous tissues dorsally and dorsal veins were tied off with 4-0 chromic suture.  The extensor tendon of the ring finger was transected and the periosteum was incised longitudinally.  A Freer elevator was passed radially and ulnarly around the base of the metacarpal and a saw was used to amputate the metacarpal.  The metacarpal was then elevated distally and  a Therapist, nutritional was used to release the muscle from the bone.  The skin incisions were then carried down to bone on the radial and ulnar side of the ring finger and also down through the volar plate.  The ray was completely excised.  The specimen was sent to the lab.  Blunt dissection was then carried palmarly to identify the neurovascular bundles and the digital arteries were tied off with 4-0 chromic suture.  The digital veins were buried in the muscle more proximally using a 4-0 Vicryl suture.  The wound was then irrigated copiously with antibiotic solution.  The remainder of the flexor tendons were pulled out distally and transected, allowing the proximal ends to retract.  Cultures were taken in the palm prior to the ray resection, after opening the soft tissues. The periosteum dorsally was then closed with 4-0 Vicryl suture and a Vesseloop drain was left in for drainage.  The muscles were reapproximated dorsally with 4-0 Vicryl and the intermetacarpal ligament was repaired with 4-0 Vicryl, drawing the small to the middle finger.  The skin was then closed with 4-0 nylon suture after controlling bleeding with electrocautery in the subcutaneous tissues.  Sterile dressings were applied, followed by a dorsal protective splint.  After releasing the tourniquet, there was good circulation to the adjacent digits.  The patient went to the recovery room, awake and stable and in good condition.DD:  06/23/99 TD:  06/25/99 Job: [id number]

## 2010-07-02 NOTE — Op Note (Signed)
[place]. [facility]  Patient:    Brandon Chambers, Brandon Chambers                    MRN: [id number] Proc. Date: 05/25/99 Adm. Date:  [id number] Attending:  Kendell Bane                           Operative Report  PREOPERATIVE DIAGNOSIS:  Flexor sheath infection, right ring finger.  POSTOPERATIVE DIAGNOSIS:  Flexor sheath infection, right ring finger.  PROCEDURE:  Incision and drainage of flexor tendon sheath, right ring finger.  SURGEON:  Lowell Bouton, M.D.  ANESTHESIA:  General.  OPERATIVE FINDINGS:  The patient had gross purulent material in the flexor sheath extending just proximal to the A1 pulley out to the A3 pulley area.  The previous amputation level had some purulent material exuding from the tip after removing the sutures.  PROCEDURE:  Under general anesthesia with a tourniquet on the right arm, the right hand was prepped and draped in usual fashion and after elevating the limb, the tourniquet was inflated to 250 mmHg.  A transverse incision was made in the palm in line with the ring finger in the proximal mid distal palmar crease over the A1 pulley.  Blunt dissection was carried down to the flexor sheath and gross purulent material was obtained.  Cultures were taken and sent to the laboratory.  A second transverse incision was made at the PIP joint volarly and blunt dissection carried down to the flexor sheath.  Again, gross purulent material was obtained.  An 18-gauge angiocath was then placed in the flexor sheath distally and proximally. The sheath was irrigated out with normal saline, followed by antibiotic solution. After completely irrigating the sheath, the distal angiocath was hooked up to an IV using half normal saline for a continuous indwelling catheter drip.  A 4-0 nylon suture was used to tie that into the sheath.  Some of the sutures were removed rom the amputation site and some purulent material was  obtained.  The wounds were left open and the catheter was left in distally and proximally for continuous irrigation.  Sterile dressings were then applied, followed by dorsal splint. The tourniquet was released prior to applying the dressings and there appeared to be good circulation to the digit.  There was no significant bleeding noted.  The patient went to the recovery room awake and stable in good condition. DD:  05/25/99 TD:  05/26/99 Job: [id number]

## 2010-07-02 NOTE — Op Note (Signed)
[place]. [facility]  Patient:    Brandon Chambers, Brandon Chambers                    MRN: [id number] Proc. Date: 05/19/99 Adm. Date:  [id number] Attending:  Kendell Bane CC:         Garrison Columbus. Yetta Barre, M.D.                           Operative Report  PREOPERATIVE DIAGNOSIS:  Squamous cell carcinoma in situ, nail, right ring finger.  POSTOPERATIVE DIAGNOSIS:  Squamous cell carcinoma in situ, nail, right ring finger.  PROCEDURE:  Amputation of distal phalanx, right ring finger.  SURGEON:  Lowell Bouton, M.D.  ANESTHESIA:  Marcaine 0.5% local with sedation.  OPERATIVE FINDINGS:  The patient had a fungating lesion that involved the entire nailbed of the right ring finger.  Previous biopsy by Dr. Garrison Columbus. Jones revealed a squamous cell carcinoma in situ.  DESCRIPTION OF PROCEDURE:  Under 0.5% Marcaine local anesthesia, with the tourniquet on the right arm, the right hand was prepped and draped in the usual  fashion and after exsanguinating the limb, the tourniquet was inflated to 250 mmHg. A volar flap was created over the pulp of the right ring finger and the amputation was carried just proximal to the nail growth area dorsally.  Sharp dissection was carried down to bone dorsally through the extensor tendon.  The collateral ligaments were then released and the flexor profundus tendon was pulled out distally and was transected; the amputation was completed.  Blunt dissection was then done volarly to identify the neurovascular bundle radially and ulnarly. These were dissected out and the nerves were transected back proximally.  The arteries were tied off with 4-0 chromic suture.  The end of the middle phalanx was then rongeured back of the condyles to round out the tip.  The volar flap was then brought out dorsally, the tourniquet was released and bleeding was controlled with electrocautery.  Suture of 4-0 nylon was used to repair the  volar flap to the dorsal area and the dog ears were resected with the scissors.  After completely  closing the wound, sterile dressings were applied and the patient went to the recovery room, awake and stable, in good condition.DD:  05/19/99 TD:  05/19/99 Job: [id number]

## 2010-07-02 NOTE — Discharge Summary (Signed)
[place]. [facility]  Patient:    Brandon Chambers, Brandon Chambers                    MRN: [id number] Adm. Date:  [id number] Disc. Date: [id number] Attending:  Kendell Bane                           Discharge Summary  FINAL DIAGNOSIS:  Flexor tendon sheath infection, right ring finger.  ADDITIONAL DIAGNOSES: 1. Diabetes. 2. Squamous cell carcinoma of the finger. 3. History of a cerebrovascular accident.  HISTORY OF PRESENT ILLNESS:  The patient is a 77 year old male who underwent amputation of his right ring finger on April 4 for a squamous cell carcinoma of the nail.  Four days later, he developed redness and pain in his ring finger with an elevated glucose.  The patient was seen in the office that day and was admitted and taken to the operating room on April 10 where he underwent incision and drainage of his flexor sheath.  White blood count was 17,000 preoperatively.  HOSPITAL COURSE:  The patient was placed on Ancef IV postoperatively along with a continuous flexor sheath irrigation.  Postoperatively, his elevated glucose returned to normal and his white count went back to 11,500.  The indwelling catheter was removed on April 11 and the patient was sent to whirlpool on April 12.  He was discharged in improved condition on April 13 with a prescription for Keflex and Darvocet-N 100.  He was instructed on saline soaks three times a day to his right hand.  DISCHARGE MEDICATIONS: 1. Prinzide 20/25 mg one a day. 2. Glipizide 20 mg b.i.d. 3. Glucophage 1000 mg b.i.d. 4. Coumadin alternating 5 mg with 7.5 mg.  DISCHARGE INSTRUCTIONS:  He was instructed on a diabetic diet and was instructed to return to the office in four days. DD:  06/11/99 TD:  06/12/99 Job: [id number]

## 2010-11-19 LAB — TYPE AND SCREEN
ABO/RH(D): B NEG
Antibody Screen: NEGATIVE

## 2010-11-19 LAB — CBC
Hemoglobin: 15.4 g/dL (ref 13.0–17.0)
MCHC: 34.4 g/dL (ref 30.0–36.0)
MCV: 88.9 fL (ref 78.0–100.0)
Platelets: 141 10*3/uL — ABNORMAL LOW (ref 150–400)
RBC: 5.04 MIL/uL (ref 4.22–5.81)
RDW: 14.2 % (ref 11.5–15.5)
WBC: 12.2 10*3/uL — ABNORMAL HIGH (ref 4.0–10.5)

## 2010-11-19 LAB — URINALYSIS, ROUTINE W REFLEX MICROSCOPIC
Bilirubin Urine: NEGATIVE
Glucose, UA: NEGATIVE mg/dL
Hgb urine dipstick: NEGATIVE
Ketones, ur: NEGATIVE mg/dL
Nitrite: NEGATIVE
Protein, ur: NEGATIVE mg/dL
Specific Gravity, Urine: 1.023 (ref 1.005–1.030)
Urobilinogen, UA: 0.2 mg/dL (ref 0.0–1.0)
pH: 5.5 (ref 5.0–8.0)

## 2010-11-19 LAB — COMPREHENSIVE METABOLIC PANEL WITH GFR
ALT: 15 U/L (ref 0–53)
AST: 20 U/L (ref 0–37)
Albumin: 3.4 g/dL — ABNORMAL LOW (ref 3.5–5.2)
Alkaline Phosphatase: 56 U/L (ref 39–117)
BUN: 12 mg/dL (ref 6–23)
CO2: 25 meq/L (ref 19–32)
Calcium: 9 mg/dL (ref 8.4–10.5)
Chloride: 106 meq/L (ref 96–112)
Creatinine, Ser: 0.81 mg/dL (ref 0.4–1.5)
GFR calc non Af Amer: >60 mL/min
Glucose, Bld: 135 mg/dL — ABNORMAL HIGH (ref 70–99)
Potassium: 3.2 meq/L — ABNORMAL LOW (ref 3.5–5.1)
Sodium: 137 meq/L (ref 135–145)
Total Bilirubin: 1.2 mg/dL (ref 0.3–1.2)
Total Protein: 5.6 g/dL — ABNORMAL LOW (ref 6.0–8.3)

## 2010-11-19 LAB — GLUCOSE, CAPILLARY
Glucose-Capillary: 112 mg/dL — ABNORMAL HIGH (ref 70–99)
Glucose-Capillary: 113 mg/dL — ABNORMAL HIGH (ref 70–99)
Glucose-Capillary: 118 mg/dL — ABNORMAL HIGH (ref 70–99)
Glucose-Capillary: 119 mg/dL — ABNORMAL HIGH (ref 70–99)
Glucose-Capillary: 121 mg/dL — ABNORMAL HIGH (ref 70–99)
Glucose-Capillary: 126 mg/dL — ABNORMAL HIGH (ref 70–99)
Glucose-Capillary: 128 mg/dL — ABNORMAL HIGH (ref 70–99)
Glucose-Capillary: 133 mg/dL — ABNORMAL HIGH (ref 70–99)
Glucose-Capillary: 144 mg/dL — ABNORMAL HIGH (ref 70–99)
Glucose-Capillary: 146 mg/dL — ABNORMAL HIGH (ref 70–99)
Glucose-Capillary: 153 mg/dL — ABNORMAL HIGH (ref 70–99)
Glucose-Capillary: 183 mg/dL — ABNORMAL HIGH (ref 70–99)
Glucose-Capillary: 190 mg/dL — ABNORMAL HIGH (ref 70–99)
Glucose-Capillary: 96 mg/dL (ref 70–99)

## 2010-11-19 LAB — POCT I-STAT, CHEM 8
Calcium, Ion: 1.2 mmol/L (ref 1.12–1.32)
Glucose, Bld: 129 mg/dL — ABNORMAL HIGH (ref 70–99)
HCT: 43 % (ref 39.0–52.0)
Hemoglobin: 14.6 g/dL (ref 13.0–17.0)
TCO2: 25 mmol/L (ref 0–100)

## 2010-11-19 LAB — BASIC METABOLIC PANEL WITH GFR
BUN: 9 mg/dL (ref 6–23)
CO2: 25 meq/L (ref 19–32)
Calcium: 8.6 mg/dL (ref 8.4–10.5)
Chloride: 105 meq/L (ref 96–112)
Creatinine, Ser: 0.73 mg/dL (ref 0.4–1.5)
GFR calc non Af Amer: >60 mL/min
Glucose, Bld: 111 mg/dL — ABNORMAL HIGH (ref 70–99)
Potassium: 3.3 meq/L — ABNORMAL LOW (ref 3.5–5.1)
Sodium: 139 meq/L (ref 135–145)

## 2010-11-19 LAB — PROTIME-INR
INR: 1.2 (ref 0.00–1.49)
INR: 1.4 (ref 0.00–1.49)
INR: 1.5 (ref 0.00–1.49)
Prothrombin Time: 14.4 seconds (ref 11.6–15.2)
Prothrombin Time: 14.7 seconds (ref 11.6–15.2)
Prothrombin Time: 15.9 seconds — ABNORMAL HIGH (ref 11.6–15.2)
Prothrombin Time: 17.3 s — ABNORMAL HIGH (ref 11.6–15.2)
Prothrombin Time: 18.8 s — ABNORMAL HIGH (ref 11.6–15.2)

## 2010-11-19 LAB — APTT
aPTT: 26 s (ref 24–37)
aPTT: 27 seconds (ref 24–37)

## 2010-11-19 LAB — ABO/RH: ABO/RH(D): B NEG

## 2011-09-19 ENCOUNTER — Ambulatory Visit (INDEPENDENT_AMBULATORY_CARE_PROVIDER_SITE_OTHER): Payer: Medicare Other | Admitting: Emergency Medicine

## 2011-09-19 VITALS — BP 152/82 | HR 58 | Temp 97.6°F | Resp 18 | Ht 71.0 in | Wt 218.0 lb

## 2011-09-19 DIAGNOSIS — E119 Type 2 diabetes mellitus without complications: Secondary | ICD-10-CM

## 2011-09-19 DIAGNOSIS — M1990 Unspecified osteoarthritis, unspecified site: Secondary | ICD-10-CM

## 2011-09-19 DIAGNOSIS — D729 Disorder of white blood cells, unspecified: Secondary | ICD-10-CM

## 2011-09-19 DIAGNOSIS — I639 Cerebral infarction, unspecified: Secondary | ICD-10-CM

## 2011-09-19 DIAGNOSIS — M129 Arthropathy, unspecified: Secondary | ICD-10-CM

## 2011-09-19 DIAGNOSIS — I459 Conduction disorder, unspecified: Secondary | ICD-10-CM

## 2011-09-19 DIAGNOSIS — I10 Essential (primary) hypertension: Secondary | ICD-10-CM

## 2011-09-19 DIAGNOSIS — I6350 Cerebral infarction due to unspecified occlusion or stenosis of unspecified cerebral artery: Secondary | ICD-10-CM

## 2011-09-19 DIAGNOSIS — E785 Hyperlipidemia, unspecified: Secondary | ICD-10-CM

## 2011-09-19 LAB — LIPID PANEL
Cholesterol: 197 mg/dL (ref 0–200)
HDL: 34 mg/dL — ABNORMAL LOW (ref >39)
Total CHOL/HDL Ratio: 5.8 Ratio
Triglycerides: 208 mg/dL — ABNORMAL HIGH (ref <150)

## 2011-09-19 LAB — COMPREHENSIVE METABOLIC PANEL
AST: 15 U/L (ref 0–37)
Albumin: 4.4 g/dL (ref 3.5–5.2)
BUN: 18 mg/dL (ref 6–23)
CO2: 28 mEq/L (ref 19–32)
Calcium: 10 mg/dL (ref 8.4–10.5)
Chloride: 102 mEq/L (ref 96–112)
Creat: 0.9 mg/dL (ref 0.50–1.35)
Glucose, Bld: 106 mg/dL — ABNORMAL HIGH (ref 70–99)
Potassium: 3.9 mEq/L (ref 3.5–5.3)

## 2011-09-19 LAB — POCT CBC
HCT, POC: 53.6 % (ref 43.5–53.7)
Hemoglobin: 17.6 g/dL (ref 14.1–18.1)
Lymph, poc: 19.8 — AB (ref 0.6–3.4)
MCH, POC: 31 pg (ref 27–31.2)
MCHC: 32.8 g/dL (ref 31.8–35.4)
POC Granulocyte: 6.8 (ref 2–6.9)
POC LYMPH PERCENT: 7.8 %L — AB (ref 10–50)
RDW, POC: 13.4 %
WBC: 28 10*3/uL — AB (ref 4.6–10.2)

## 2011-09-19 LAB — GLUCOSE, POCT (MANUAL RESULT ENTRY): POC Glucose: 96 mg/dl (ref 70–99)

## 2011-09-19 MED ORDER — INSULIN GLARGINE 100 UNIT/ML ~~LOC~~ SOLN: 42.0000 [IU] | Freq: Every day | SUBCUTANEOUS | Status: DC

## 2011-09-19 MED ORDER — LISINOPRIL-HYDROCHLOROTHIAZIDE 20-25 MG PO TABS: 1.0000 | ORAL_TABLET | Freq: Every day | ORAL | Status: DC

## 2011-09-19 MED ORDER — INSULIN ASPART PROT & ASPART (70-30 MIX) 100 UNIT/ML ~~LOC~~ SUSP: 15.0000 [IU] | Freq: Every day | SUBCUTANEOUS | Status: DC

## 2011-09-19 MED ORDER — TRAMADOL HCL 50 MG PO TABS: 50.0000 mg | ORAL_TABLET | Freq: Three times a day (TID) | ORAL | Status: AC | PRN

## 2011-09-19 NOTE — Progress Notes (Signed)
  Subjective:    Patient ID: Brandon Chambers, male    DOB: 11-Mar-1928, 78 y.o.   MRN: [id number]  HPINo complaints. Here because his meds are running out.He is a hpertensive diabetic with no followup.He is on insulin and BP meds    Review of Systems  Constitutional: Negative.   Respiratory: Negative.   Cardiovascular: Negative.   Gastrointestinal: Negative.        Objective:   Physical Exam  Constitutional: He appears well-developed and well-nourished.  HENT:  Head: Normocephalic.  Eyes: Pupils are equal, round, and reactive to light.  Neck: No thyromegaly present.  Cardiovascular: Normal rate, regular rhythm and normal heart sounds.  Exam reveals no gallop and no friction rub.   No murmur heard. Pulmonary/Chest: Effort normal and breath sounds normal. No respiratory distress. He has no rales.  Abdominal: He exhibits no distension. There is no tenderness. There is no rebound.   EKG LAH, First degree block, RBBB   Results for orders placed in visit on 09/19/11  POCT CBC      Component Value Range   WBC 28.0 (*) 4.6 - 10.2 K/uL   Lymph, poc 19.8 (*) 0.6 - 3.4   POC LYMPH PERCENT 7.8 (*) 10 - 50 %L   MID (cbc) 1.4 (*) 0 - 0.9   POC MID % 5.0  0 - 12 %M   POC Granulocyte 6.8  2 - 6.9   Granulocyte percent 24.2 (*) 37 - 80 %G   RBC 5.68  4.69 - 6.13 M/uL   Hemoglobin 17.6  14.1 - 18.1 g/dL   HCT, POC 91.4  78.2 - 53.7 %   MCV 94.3  80 - 97 fL   MCH, POC 31.0  27 - 31.2 pg   MCHC 32.8  31.8 - 35.4 g/dL   RDW, POC 95.6     Platelet Count, POC 172  142 - 424 K/uL   MPV 13.9  0 - 99.8 fL  GLUCOSE, POCT (MANUAL RESULT ENTRY)      Component Value Range   POC Glucose 96  70 - 99 mg/dl  POCT GLYCOSYLATED HEMOGLOBIN (HGB A1C)      Component Value Range   Hemoglobin A1C 7.7        Assessment & Plan:  Meds refilled. Referral to hematology and cardiology.Recheck one month

## 2011-09-21 ENCOUNTER — Telehealth: Payer: Self-pay | Admitting: Hematology & Oncology

## 2011-09-21 NOTE — Telephone Encounter (Signed)
Confirmed 10/31/11 appointment time and date with patient.

## 2011-10-12 ENCOUNTER — Other Ambulatory Visit: Payer: Self-pay | Admitting: Lab

## 2011-10-12 ENCOUNTER — Ambulatory Visit: Payer: Self-pay

## 2011-10-12 ENCOUNTER — Ambulatory Visit: Payer: Self-pay | Admitting: Hematology & Oncology

## 2011-10-20 ENCOUNTER — Encounter: Payer: Self-pay | Admitting: *Deleted

## 2011-10-20 ENCOUNTER — Telehealth: Payer: Self-pay

## 2011-10-20 ENCOUNTER — Encounter: Payer: Self-pay | Admitting: Cardiovascular Disease

## 2011-10-21 ENCOUNTER — Ambulatory Visit (INDEPENDENT_AMBULATORY_CARE_PROVIDER_SITE_OTHER): Payer: Self-pay | Admitting: Cardiovascular Disease

## 2011-10-21 ENCOUNTER — Encounter: Payer: Self-pay | Admitting: Cardiovascular Disease

## 2011-10-21 VITALS — BP 157/89 | HR 80 | Wt 214.0 lb

## 2011-10-21 DIAGNOSIS — I10 Essential (primary) hypertension: Secondary | ICD-10-CM

## 2011-10-21 DIAGNOSIS — I739 Peripheral vascular disease, unspecified: Secondary | ICD-10-CM

## 2011-10-21 DIAGNOSIS — I4430 Unspecified atrioventricular block: Secondary | ICD-10-CM | POA: Insufficient documentation

## 2011-10-21 DIAGNOSIS — E119 Type 2 diabetes mellitus without complications: Secondary | ICD-10-CM | POA: Insufficient documentation

## 2011-10-21 DIAGNOSIS — I442 Atrioventricular block, complete: Secondary | ICD-10-CM

## 2011-10-21 DIAGNOSIS — R0602 Shortness of breath: Secondary | ICD-10-CM

## 2011-10-21 DIAGNOSIS — E7800 Pure hypercholesterolemia, unspecified: Secondary | ICD-10-CM

## 2011-10-21 NOTE — Assessment & Plan Note (Signed)
Discussed low carb diet.  Target hemoglobin A1c is 6.5 or less.  Continue current medications.  

## 2011-10-21 NOTE — Assessment & Plan Note (Signed)
Well controlled.  Continue current medications and low sodium Dash type diet.   Avoid AV nodal blocking drugs 

## 2011-10-21 NOTE — Assessment & Plan Note (Signed)
Cholesterol is at goal.  Continue current dose of statin and diet Rx.  No myalgias or side effects.  F/U  LFT's in 6 months. Lab Results  Component Value Date   LDLCALC 121* 09/19/2011             

## 2011-10-21 NOTE — Progress Notes (Signed)
Patient ID: Brandon Chambers, male   DOB: 08-08-1928, 78 y.o.   MRN: [id number] 78 yo with PVD S/O left perineal bypass.  Last seen here in 2008 with normal myovue.  Known trifasicular block. Poor medical F/U  Wife has dementia and he cares for her Seen at urgent care and referred for heart block.  Review of ECG from 8/5 shows trifasicular block with wenkebach.  He denies syncope, chest pain.  Does have some exertional dyspnea.  Compliant with meds.  Labs reviewed and HDL 34 with LDL 121  A1C 7.7 K 3.9 Cr .9 normal LFTls.     ROS: Denies fever, malais, weight loss, blurry vision, decreased visual acuity, cough, sputum, SOB, hemoptysis, pleuritic pain, palpitaitons, heartburn, abdominal pain, melena, lower extremity edema, claudication, or rash.  All other systems reviewed and negative   General: Affect appropriate Healthy:  appears stated age HEENT: normal Neck supple with no adenopathy JVP normal no bruits no thyromegaly Lungs clear with no wheezing and good diaphragmatic motion Heart:  S1/S2 no murmur,rub, gallop or click PMI normal Abdomen: benighn, BS positve, no tenderness, no AAA no bruit.  No HSM or HJR Distal pulses intact with no bruits left pop to perineal bypass  No edema Neuro non-focal Skin warm and dry No muscular weakness  Medications Current Outpatient Prescriptions  Medication Sig Dispense Refill  . insulin aspart protamine-insulin aspart (NOVOLOG 70/30) (70-30) 100 UNIT/ML injection Inject 15-20 Units into the skin daily with breakfast. 15-20 units depending on what glucose level is  10 mL  3  . insulin glargine (LANTUS) 100 UNIT/ML injection Inject 42 Units into the skin at bedtime.  10 mL  3  . lisinopril-hydrochlorothiazide (PRINZIDE,ZESTORETIC) 20-25 MG per tablet Take 1 tablet by mouth daily.  30 tablet  3  . traMADol (ULTRAM) 50 MG tablet Take 50 mg by mouth as needed.      . warfarin (COUMADIN) 5 MG tablet Take 5 mg by mouth daily. Taking 5 mg every day except  Friday and Monday taking 2.5 mg on Friday and Monday followed by Dr Sandria Manly        Allergies Adhesive and Codeine  Family History: No family history on file.  Social History: History   Social History  . Marital Status: Married    Spouse Name: N/A    Number of Children: N/A  . Years of Education: N/A   Occupational History  . Not on file.   Social History Main Topics  . Smoking status: Former Smoker -- 0.5 packs/day for 20 years    Types: Cigarettes    Quit date: 02/15/1975  . Smokeless tobacco: Not on file  . Alcohol Use: Not on file  . Drug Use: Not on file  . Sexually Active: Not on file   Other Topics Concern  . Not on file   Social History Narrative   He is married with four children.  He is retired.  Hequit smoking 32 years ago.  He does not drink alcohol.    Electrocardiogram:  8/5 Trifasicular block with Wenkebach Compared to 2008 wenkebach is new  Assessment and Plan

## 2011-10-21 NOTE — Patient Instructions (Addendum)
Your physician wants you to follow-up in:  6 MONTHS WITH DR Haywood Filler will receive a reminder letter in the mail two months in advance. If you don't receive a letter, please call our office to schedule the follow-up appointment. Your physician recommends that you continue on your current medications as directed. Please refer to the Current Medication list given to you today. Your physician has requested that you have en exercise stress myoview. For further information please visit [email or web address] Please follow instruction sheet, as given. DX HEART BLOCK DIABETES Your physician has requested that you have a lower or upper extremity arterial duplex. This test is an ultrasound of the arteries in the legs or arms. It looks at arterial blood flow in the legs and arms. Allow one hour for Lower and Upper Arterial scans. There are no restrictions or special instructions LOWER  DX PVD

## 2011-10-21 NOTE — Assessment & Plan Note (Signed)
No indication for pacer.  ETT to assess HR response  R/O ischemia.  Avoid AV nodal drugs.  Known trifasicular block as far back as 2008.  May need pacer in future but asymptomatic now

## 2011-10-21 NOTE — Assessment & Plan Note (Signed)
Given DM and conduction disease F/U exercise stress myovue.  See if AV block worsens with walking and R/O ischemia check EF

## 2011-10-25 ENCOUNTER — Other Ambulatory Visit: Payer: Self-pay | Admitting: Cardiology

## 2011-10-25 ENCOUNTER — Encounter (INDEPENDENT_AMBULATORY_CARE_PROVIDER_SITE_OTHER): Payer: Medicare Other

## 2011-10-25 DIAGNOSIS — E1159 Type 2 diabetes mellitus with other circulatory complications: Secondary | ICD-10-CM

## 2011-10-25 DIAGNOSIS — I739 Peripheral vascular disease, unspecified: Secondary | ICD-10-CM

## 2011-10-27 ENCOUNTER — Ambulatory Visit (HOSPITAL_COMMUNITY): Payer: Medicare Other | Attending: Internal Medicine | Admitting: Radiology

## 2011-10-27 VITALS — BP 164/67 | Ht 72.0 in | Wt 208.0 lb

## 2011-10-27 DIAGNOSIS — R0609 Other forms of dyspnea: Secondary | ICD-10-CM | POA: Insufficient documentation

## 2011-10-27 DIAGNOSIS — E119 Type 2 diabetes mellitus without complications: Secondary | ICD-10-CM | POA: Insufficient documentation

## 2011-10-27 DIAGNOSIS — R002 Palpitations: Secondary | ICD-10-CM | POA: Insufficient documentation

## 2011-10-27 DIAGNOSIS — Z87891 Personal history of nicotine dependence: Secondary | ICD-10-CM | POA: Insufficient documentation

## 2011-10-27 DIAGNOSIS — I739 Peripheral vascular disease, unspecified: Secondary | ICD-10-CM | POA: Insufficient documentation

## 2011-10-27 DIAGNOSIS — I10 Essential (primary) hypertension: Secondary | ICD-10-CM | POA: Insufficient documentation

## 2011-10-27 DIAGNOSIS — I442 Atrioventricular block, complete: Secondary | ICD-10-CM

## 2011-10-27 DIAGNOSIS — R0989 Other specified symptoms and signs involving the circulatory and respiratory systems: Secondary | ICD-10-CM | POA: Insufficient documentation

## 2011-10-27 DIAGNOSIS — R9431 Abnormal electrocardiogram [ECG] [EKG]: Secondary | ICD-10-CM

## 2011-10-27 MED ORDER — TECHNETIUM TC 99M SESTAMIBI GENERIC - CARDIOLITE
10.0000 | Freq: Once | INTRAVENOUS | Status: AC | PRN
  Administered 2011-10-27: 10 via INTRAVENOUS

## 2011-10-27 MED ORDER — TECHNETIUM TC 99M SESTAMIBI GENERIC - CARDIOLITE
30.0000 | Freq: Once | INTRAVENOUS | Status: AC | PRN
  Administered 2011-10-27: 30 via INTRAVENOUS

## 2011-10-27 NOTE — Progress Notes (Signed)
[facility] SITE 3 NUCLEAR MED [address] [place] [address] [phone]  Cardiology Nuclear Med Study  DAMAN STEFFENHAGEN is a 78 y.o. male     MRN : [id number]     DOB: [birth year]  Procedure Date: 10/27/2011  Nuclear Med Background Indication for Stress Test:  Evaluation for Ischemia History:  2008 MPS: NL EF: 57%, ECHO: EF: 55% known AVB  Cardiac Risk Factors: CVA, History of Smoking, Hypertension, IDDM Type 2, Lipids and PVD  Symptoms:  DOE and Palpitations   Nuclear Pre-Procedure Caffeine/Decaff Intake:  None NPO After: 6:00am   Lungs:  clear O2 Sat: 95% on room air. IV 0.9% NS with Angio Cath:  20g  IV Site: R Hand  IV Started by:  Bonnita Levan, RN  Chest Size (in):  46 Cup Size: n/a  Height: 6' (1.829 m)  Weight:  208 lb (94.348 kg)  BMI:  Body mass index is 28.21 kg/(m^2). Tech Comments:  BS 170 @ 530AM. This patient went into 2nd degree AVB in recovery.    Nuclear Med Study 1 or 2 day study: 1 day  Stress Test Type:  Stress  Reading MD: Willa Rough, MD  Order Authorizing Provider: Charlton Haws, MD  Resting Radionuclide: Technetium 4m Sestamibi  Resting Radionuclide Dose: 11.0 mCi   Stress Radionuclide:  Technetium 7m Sestamibi  Stress Radionuclide Dose: 33.0 mCi           Stress Protocol Rest HR: 54 Stress HR: 137  Rest BP: 164/67 Stress BP: 196/91  Exercise Time (min): 3:45 METS: 5.50   Predicted Max HR: 140 bpm % Max HR: 97.86 bpm Rate Pressure Product: 28413   Dose of Adenosine (mg):  n/a Dose of Lexiscan: n/a mg  Dose of Atropine (mg): n/a Dose of Dobutamine: n/a mcg/kg/min (at max HR)  Stress Test Technologist: Milana Na, EMT-P  Nuclear Technologist:  Domenic Polite, CNMT     Rest Procedure:  Myocardial perfusion imaging was performed at rest 45 minutes following the intravenous administration of Technetium 108m Tetrofosmin. Rest ECG: Sinus Bradycardia with 1st degree AVB RBBB.  Stress Procedure:  The patient  performed treadmill exercise using a Bruce  Protocol for 3:45 minutes. The patient stopped due to sob, fatigue and denied any chest pain.  There were no significant ST-T wave changes and occ pvcs.  Technetium 46m Tetrofosmin was injected at peak exercise and myocardial perfusion imaging was performed after a brief delay. Stress ECG: No significant change from baseline ECG  QPS Raw Data Images:  Images were motion corrected.  Soft tissue (diaphragm, bowel) underlie heart. Stress Images:  Thinning with decreased tracer activity in the inferior wall (base, mid, distal) and apex.  Otherwise normal perfusion. Rest Images:  No significant changed from the stress images. Subtraction (SDS):  No significant ischemia Transient Ischemic Dilatation (Normal <1.22):  0.87 Lung/Heart Ratio (Normal <0.45):  0.38  Quantitative Gated Spect Images QGS EDV:  104 ml QGS ESV:  41 ml  Impression Exercise Capacity:  Poor exercise capacity. BP Response:  Normal blood pressure response. Clinical Symptoms:  No chest pain. ECG Impression:  No significant ST segment change suggestive of ischemia. Comparison with Prior Nuclear Study: Previous scan with normal perfusion.  Overall Impression:  Probable normal perfusion with soft tissue attenuation (diaphragm,bowel); cannot exclude subendocardial scar.  No evidence of ischemia.    LV Ejection Fraction: 61%.  LV Wall Motion:  Normal thickening.  Dietrich Pates

## 2011-10-31 ENCOUNTER — Ambulatory Visit (HOSPITAL_BASED_OUTPATIENT_CLINIC_OR_DEPARTMENT_OTHER): Payer: Medicare Other | Admitting: Hematology & Oncology

## 2011-10-31 ENCOUNTER — Other Ambulatory Visit (HOSPITAL_COMMUNITY)
Admission: RE | Admit: 2011-10-31 | Discharge: 2011-10-31 | Disposition: A | Discharge status: Home / Self Care | Payer: Medicare Other | Source: Ambulatory Visit | Attending: Hematology & Oncology | Admitting: Hematology & Oncology

## 2011-10-31 ENCOUNTER — Ambulatory Visit: Payer: Medicare Other

## 2011-10-31 ENCOUNTER — Other Ambulatory Visit (HOSPITAL_BASED_OUTPATIENT_CLINIC_OR_DEPARTMENT_OTHER): Payer: Medicare Other | Admitting: Lab

## 2011-10-31 VITALS — BP 157/66 | HR 60 | Temp 97.7°F | Resp 20 | Ht 72.0 in | Wt 212.0 lb

## 2011-10-31 DIAGNOSIS — D72829 Elevated white blood cell count, unspecified: Secondary | ICD-10-CM

## 2011-10-31 DIAGNOSIS — D509 Iron deficiency anemia, unspecified: Secondary | ICD-10-CM

## 2011-10-31 DIAGNOSIS — C9110 Chronic lymphocytic leukemia of B-cell type not having achieved remission: Secondary | ICD-10-CM

## 2011-10-31 DIAGNOSIS — D72820 Lymphocytosis (symptomatic): Secondary | ICD-10-CM | POA: Insufficient documentation

## 2011-10-31 DIAGNOSIS — D45 Polycythemia vera: Secondary | ICD-10-CM

## 2011-10-31 LAB — CBC WITH DIFFERENTIAL (CANCER CENTER ONLY)
BASO%: 0.3 % (ref 0.0–2.0)
HCT: 46.2 % (ref 38.7–49.9)
LYMPH%: 72 % — ABNORMAL HIGH (ref 14.0–48.0)
MCH: 31 pg (ref 28.0–33.4)
MCHC: 35.7 g/dL (ref 32.0–35.9)
MCV: 87 fL (ref 82–98)
MONO#: 0.9 10*3/uL (ref 0.1–0.9)
MONO%: 3.9 % (ref 0.0–13.0)
NEUT%: 23.5 % — ABNORMAL LOW (ref 40.0–80.0)
Platelets: 177 10*3/uL (ref 145–400)
RDW: 13.2 % (ref 11.1–15.7)

## 2011-10-31 LAB — CHCC SATELLITE - SMEAR

## 2011-10-31 NOTE — Patient Instructions (Signed)
Call if swollen nodes, bleeding, weakness

## 2011-10-31 NOTE — Progress Notes (Signed)
This office note has been dictated.

## 2011-11-01 LAB — FERRITIN: Ferritin: 154 ng/mL (ref 22–322)

## 2011-11-01 NOTE — Progress Notes (Signed)
CC:   Stan Head. Cleta Alberts, M.D. Noralyn Pick. Eden Emms, MD, [facility] Bertram Millard. Dahlstedt, M.D.  DIAGNOSIS:  Lymphocytosis, likely chronic lymphocytic leukemia.  HISTORY OF PRESENT ILLNESS:  Mr. Bloodgood is a really nice 78 year old white gentleman.  He is followed by Dr. Cleta Alberts. He has had a history of prostate cancer.  This was removed, I think, 17 years ago.  He does have diabetes. He has hypertension.  He does have cardiac issues with heart block.  Dr. Cleta Alberts noted that he has been having some change in his blood counts.  Back in 2009, his white count was 11.9.  In August of 2013, his white count was 28,000.  His hemoglobin was 17.6, hematocrit 53.6 and platelet count 172.  There was really no white cell differential that was done, although it appears that most of the white cells were lymphocytes.  This was noted by Dr. Cleta Alberts.  He then went ahead and referred Mr. Tilmon to the [employer] for evaluation.  Mr. Kakar has not noted any new issues, health-wise.  He has had no problem with infections.  He has had no recurrent fevers.  He has had no bleeding or bruising.  He has not noticed any palpable lymph glands.  He has had no weight loss or weight gain.  He has had no rashes.  There has been no change in bowel or bladder habits.  He does have the conduction abnormalities with his heart.  He is seeing Dr. Eden Emms.  Dr. Eden Emms has not felt that he has needed any pacemaker.  PAST MEDICAL HISTORY: 1. Insulin-dependent diabetes. 2. Hypertension. 3. Hyperlipidemia. 4. Peripheral vascular disease. 5. Prostate cancer. 6. Heart block.  ALLERGIES:  To adhesive tape and codeine.  MEDICATIONS:  Lantus insulin 42 units subcu q.h.s., Zestoretic (20/25) 1 p.o. daily, Coumadin 5 mg p.o. daily except Monday and Friday, when he 2.5 mg, Ultram 50 mg p.o. q.8 hours p.r.n.,  NovoLog insulin as needed.  SOCIAL HISTORY:  Remarkable for remote tobacco use.  He stopped in 1977. He has  about a 10 pack year history of tobacco use.  There is no alcohol use.  He has no obvious occupational exposures.  He was in the [employer].  He is in [place] for 3 years.  He then got out.  He has been in the [place] area I think since 1979.  FAMILY HISTORY:  Relatively noncontributory.  REVIEW OF SYSTEMS:  As stated in history present illness.  No additional findings are noted on a 12-system review.  PHYSICAL EXAMINATION:  This is a elderly but well-nourished white gentleman in no obvious distress.  Vital signs:  Temperature of 97.7, pulse 60, respiratory rate 20, blood pressure 157/66.  Weight is 212. Head and neck:  Normocephalic, atraumatic skull.  He has no ocular or oral lesions.  There is no scleral icterus.  There is no adenopathy in the cervical or supraclavicular nodes.  Lungs: Clear bilaterally. Cardiac:  Regular rate and rhythm with an occasional extra beat.  He does occasional skip a beat. He has a 1/6 systolic ejection murmur. Abdomen:  Soft with good bowel sounds.  There is no fluid wave.  There is no palpable abdominal mass.  There is no palpable hepatosplenomegaly. Axillary exam shows no bilateral axillary adenopathy.  Inguinal exam shows no inguinal adenopathy bilaterally.  Extremities:  No clubbing, cyanosis or edema.  He has good range motion of his joints.  Skin:  Some scattered ecchymoses.  Neurological:  No focal neurological  deficits.  LABORATORY STUDIES:  White cell count is 24, hemoglobin 16.5, hematocrit 46.2, platelet count 177.  White cell differential shows 24 segs, 70 lymphocytes, 4 monos.  Peripheral smear shows a normochromic, normocytic population of red blood cells.  There are no nucleated red blood cells.  I see no teardrop cells.  He has no schistocytes.  There are no spherocytes.  White cells appear with an increase in lymphocytes.  He has numerous smudge cells. He has some atypical lymphocytes.  I see no blasts.  I see no immature myeloid  cells.  Platelets are adequate in number and size.  He has several large platelets.  Platelets are well-granulated.  IMPRESSION:  Mr. Osment is a nice 78 year old gentleman with leukocytosis.  He has a lymphocytosis.  I have to believe that he has a chronic lymphocytic leukemia (CLL).  His blood smear is very consistent with this.  I see smudge cells. The lymphocytes do appear to be somewhat immature and abnormal in morphology.  We did send off his blood for flow cytometry.  I do not see that we have to do a bone marrow test on him.  I think even if he does have CLL, he does not need any therapy for this. I would just follow him along for right now as he is asymptomatic.  Hopefully, if we do find that Mr. Luckett has CLL, that we will not have to treat him.  I spent a good hour or so with Mr. Fini. I showed him his lab work. I reviewed my recommendations to him.  I do want to see him back in 2 months and then we will reassess the situation and see how his blood counts are.  Mr. Illescas is a real nice guy.  I certainly enjoyed seeing him and having good fellowship with him.    ______________________________ Josph Macho, M.D. PRE/MEDQ  D:  10/31/2011  T:  11/01/2011  Job:  [id number]

## 2011-11-02 ENCOUNTER — Institutional Professional Consult (permissible substitution): Payer: Medicare Other | Admitting: Cardiovascular Disease

## 2011-11-02 LAB — FLOW CYTOMETRY - CHCC SATELLITE

## 2011-12-30 ENCOUNTER — Ambulatory Visit (HOSPITAL_BASED_OUTPATIENT_CLINIC_OR_DEPARTMENT_OTHER): Payer: Medicare Other | Admitting: Hematology & Oncology

## 2011-12-30 ENCOUNTER — Other Ambulatory Visit (HOSPITAL_BASED_OUTPATIENT_CLINIC_OR_DEPARTMENT_OTHER): Payer: Medicare Other | Admitting: Lab

## 2011-12-30 VITALS — BP 125/76 | HR 76 | Temp 97.3°F | Resp 20 | Ht 70.0 in | Wt 210.0 lb

## 2011-12-30 DIAGNOSIS — E119 Type 2 diabetes mellitus without complications: Secondary | ICD-10-CM

## 2011-12-30 DIAGNOSIS — C9110 Chronic lymphocytic leukemia of B-cell type not having achieved remission: Secondary | ICD-10-CM

## 2011-12-30 LAB — CBC WITH DIFFERENTIAL (CANCER CENTER ONLY)
BASO#: 0.1 10*3/uL (ref 0.0–0.2)
EOS%: 0.3 % (ref 0.0–7.0)
Eosinophils Absolute: 0.1 10*3/uL (ref 0.0–0.5)
HGB: 16.6 g/dL (ref 13.0–17.1)
LYMPH#: 16 10*3/uL — ABNORMAL HIGH (ref 0.9–3.3)
MCHC: 35.2 g/dL (ref 32.0–35.9)
MONO#: 1.2 10*3/uL — ABNORMAL HIGH (ref 0.1–0.9)
NEUT#: 6 10*3/uL (ref 1.5–6.5)
RBC: 5.39 10*6/uL (ref 4.20–5.70)
WBC: 23.4 10*3/uL — ABNORMAL HIGH (ref 4.0–10.0)

## 2011-12-30 LAB — CHCC SATELLITE - SMEAR

## 2011-12-30 NOTE — Progress Notes (Signed)
This office note has been dictated.

## 2011-12-31 NOTE — Progress Notes (Signed)
CC:   Brandon Brandon Chambers. Brandon Brandon Chambers, M.D. Brandon Millard. Brandon Chambers, M.D. Brandon Pick. Eden Emms, MD, [facility]  DIAGNOSIS:  Stage A chronic lymphocytic leukemia.  CURRENT THERAPY:  Observation.  INTERIM HISTORY:  Brandon Brandon Chambers comes in for followup.  He does have CLL. We did do flow cytometry on his blood back in September.  Flow cytology was positive for CLL.  He has been asymptomatic with this.  He has had no problems with fevers, sweats, or chills.  There have been no palpable lymph glands.  He has had no nausea or vomiting.  He has had no cough or shortness of breath.  He has had no change in his medications.  He does have diabetes, but this appears to be under fairly good control.  He is on Coumadin.  PHYSICAL EXAMINATION:  General:  This is an elderly white gentleman in no obvious distress.  Vital signs:  Temperature of 97.3, pulse 76, respiratory rate 18, blood pressure 125/76.  Weight is 210 pounds.  Brandon Chambers and Neck:  Normocephalic, atraumatic skull.  There are no ocular or oral lesions.  There are no palpable cervical or supraclavicular lymph nodes. Lungs:  Clear bilaterally.  Cardiac:  Regular rate and rhythm with a normal S1 and S2.  He does have an occasional extra beat.  He has a 1/6 systolic ejection murmur.  Abdomen:  No fluid wave.  There is no abdominal mass.  There is no palpable hepatosplenomegaly.  Extremities: No clubbing, cyanosis, or edema.  Axillae:  No bilateral axillary adenopathy.  LABORATORY STUDIES:  White cell count 23.4, hemoglobin 16.6, hematocrit 47.1, platelet count 170.  White cell differential shows 26 segs and 68 lymphs.  IMPRESSION:  Brandon Brandon Chambers is an 78 year old gentleman with stage A chronic lymphocytic leukemia.  He is doing well with this.  He does not need any intervention for this.  For now, we will plan to get him back in March.  I think we can get him through the wintertime without having to come back to see [place].  Of note, the FDA just approved the new targeted  therapy for CLL.  This is ibrutinib.  This will be a revolution in the treatment of CLL.   ______________________________ Josph Macho, M.D. PRE/MEDQ  D:  12/30/2011  T:  12/31/2011  Job:  [id number]

## 2012-01-03 LAB — IGG, IGA, IGM
IgA: 184 mg/dL (ref 68–379)
IgM, Serum: 27 mg/dL — ABNORMAL LOW (ref 41–251)

## 2012-01-03 LAB — PROTEIN ELECTROPHORESIS, SERUM, WITH REFLEX
Alpha-1-Globulin: 4.4 % (ref 2.9–4.9)
Gamma Globulin: 8.2 % — ABNORMAL LOW (ref 11.1–18.8)

## 2012-01-06 ENCOUNTER — Other Ambulatory Visit: Payer: Self-pay | Admitting: Emergency Medicine

## 2012-02-16 ENCOUNTER — Other Ambulatory Visit: Payer: Self-pay | Admitting: Emergency Medicine

## 2012-02-16 NOTE — Telephone Encounter (Signed)
Chart pulled at nurses station pa pool [phone]

## 2012-02-28 ENCOUNTER — Other Ambulatory Visit: Payer: Self-pay | Admitting: *Deleted

## 2012-02-28 MED ORDER — INSULIN GLARGINE 100 UNIT/ML ~~LOC~~ SOLN: 42.0000 [IU] | Freq: Every day | SUBCUTANEOUS | Status: DC

## 2012-02-28 NOTE — Telephone Encounter (Signed)
Advised pt that he does need an office visit. Pt stated he will try and come in

## 2012-03-05 ENCOUNTER — Encounter: Payer: Self-pay | Admitting: Emergency Medicine

## 2012-03-05 ENCOUNTER — Ambulatory Visit (INDEPENDENT_AMBULATORY_CARE_PROVIDER_SITE_OTHER): Payer: Medicare Other | Admitting: Emergency Medicine

## 2012-03-05 VITALS — BP 124/76 | HR 69 | Temp 98.0°F | Resp 16 | Ht 72.0 in | Wt 209.2 lb

## 2012-03-05 DIAGNOSIS — Z7901 Long term (current) use of anticoagulants: Secondary | ICD-10-CM

## 2012-03-05 DIAGNOSIS — I10 Essential (primary) hypertension: Secondary | ICD-10-CM

## 2012-03-05 DIAGNOSIS — I639 Cerebral infarction, unspecified: Secondary | ICD-10-CM | POA: Insufficient documentation

## 2012-03-05 DIAGNOSIS — C9110 Chronic lymphocytic leukemia of B-cell type not having achieved remission: Secondary | ICD-10-CM | POA: Insufficient documentation

## 2012-03-05 DIAGNOSIS — E119 Type 2 diabetes mellitus without complications: Secondary | ICD-10-CM

## 2012-03-05 LAB — BASIC METABOLIC PANEL
Chloride: 101 mEq/L (ref 96–112)
Potassium: 4 mEq/L (ref 3.5–5.3)

## 2012-03-05 NOTE — Progress Notes (Signed)
  Subjective:    Patient ID: Brandon Chambers, male    DOB: November 30, 1928, 79 y.o.   MRN: [id number]  HPI patient states he has been doing well. He is seen today because he needs monitoring of his Coumadin therapy. He is a long-time patient of Dr. love. On Coumadin since the mid 1990s for his CVA he suffered at that time. Complicating features reveal he also has an AV block. He also has diabetes controlled with insulin. He has a history of chronic lymphocytic leukemia not currently under treatment and followed by Dr. Myna Hidalgo    Review of Systems     Objective:   Physical Exam H. EENT exam is unremarkable. His neck is supple. His chest is clear to auscultation and percussion. His cardiac exam reveals a regular rate without murmurs rubs or gallops. Examination of the feet reveal pulses 2+ sensation is normal. There are no ulcerations or areas of skin breakdown Results for orders placed in visit on 03/05/12  POCT GLYCOSYLATED HEMOGLOBIN (HGB A1C)      Component Value Range   Hemoglobin A1C 7.5           Assessment & Plan:  His diabetes is acceptable but not perfect. I've referred him to the Coumadin clinic at [facility]. Patient has absolutely no interest in having colonoscopy done. He states if  he started to have abdominal pain or bleeding he would consider it but not at the present time.

## 2012-03-06 LAB — PROTIME-INR
INR: 1.43 (ref <1.50)
Prothrombin Time: 17.2 seconds — ABNORMAL HIGH (ref 11.6–15.2)

## 2012-03-16 ENCOUNTER — Encounter: Payer: Self-pay | Admitting: Cardiovascular Disease

## 2012-03-16 ENCOUNTER — Ambulatory Visit (INDEPENDENT_AMBULATORY_CARE_PROVIDER_SITE_OTHER): Payer: Medicare Other

## 2012-03-16 ENCOUNTER — Ambulatory Visit (INDEPENDENT_AMBULATORY_CARE_PROVIDER_SITE_OTHER): Payer: Medicare Other | Admitting: Cardiovascular Disease

## 2012-03-16 VITALS — BP 163/73 | HR 55 | Ht 71.0 in | Wt 210.0 lb

## 2012-03-16 DIAGNOSIS — Z5181 Encounter for therapeutic drug level monitoring: Secondary | ICD-10-CM

## 2012-03-16 DIAGNOSIS — I639 Cerebral infarction, unspecified: Secondary | ICD-10-CM

## 2012-03-16 DIAGNOSIS — I10 Essential (primary) hypertension: Secondary | ICD-10-CM

## 2012-03-16 DIAGNOSIS — Z7901 Long term (current) use of anticoagulants: Secondary | ICD-10-CM

## 2012-03-16 DIAGNOSIS — E7800 Pure hypercholesterolemia, unspecified: Secondary | ICD-10-CM

## 2012-03-16 DIAGNOSIS — I4430 Unspecified atrioventricular block: Secondary | ICD-10-CM

## 2012-03-16 DIAGNOSIS — I6350 Cerebral infarction due to unspecified occlusion or stenosis of unspecified cerebral artery: Secondary | ICD-10-CM

## 2012-03-16 DIAGNOSIS — Z79899 Other long term (current) drug therapy: Secondary | ICD-10-CM

## 2012-03-16 LAB — POCT INR: INR: 2.6

## 2012-03-16 NOTE — Assessment & Plan Note (Signed)
Well controlled.  Continue current medications and low sodium Dash type diet.    

## 2012-03-16 NOTE — Patient Instructions (Addendum)
Your physician wants you to follow-up in:  6 MONTHS WITH DR NISHAN  You will receive a reminder letter in the mail two months in advance. If you don't receive a letter, please call our office to schedule the follow-up appointment. Your physician recommends that you continue on your current medications as directed. Please refer to the Current Medication list given to you today. 

## 2012-03-16 NOTE — Assessment & Plan Note (Signed)
Cholesterol is at goal.  Continue current dose of statin and diet Rx.  No myalgias or side effects.  F/U  LFT's in 6 months. Lab Results  Component Value Date   LDLCALC 121* 09/19/2011

## 2012-03-16 NOTE — Assessment & Plan Note (Signed)
Will discuss need for ongoing coumadin with Dr Sandria Manly  Distant CVA 17 years ago.  No afib.  Check INR today

## 2012-03-16 NOTE — Progress Notes (Signed)
Patient ID: Brandon Chambers, male   DOB: [birth year], 79 y.o.   MRN: [id number] 79 yo with PVD S/O left perineal bypass. Last seen here in 2008 with normal myovue. Known trifasicular block. Poor medical F/U Wife has dementia and he cares for her  Seen at urgent care and referred for heart block. Review of ECG from 8/5 shows trifasicular block with wenkebach. He denies syncope, chest pain. Does have some exertional dyspnea. Compliant with meds. Labs reviewed and HDL 34 with LDL 121 A1C 7.7 K 3.9 Cr .9 normal LFTls.   myovue done 9/13 normal   Wants his coumadin checked  He has been on it for 17 years since he had a CVA Normally followed by Dr Sandria Manly It is not clear to me why he is on coumadin for this long.  Or what etiology of original stroke was.  Will discuss with Dr Sandria Manly but check INR today  ROS: Denies fever, malais, weight loss, blurry vision, decreased visual acuity, cough, sputum, SOB, hemoptysis, pleuritic pain, palpitaitons, heartburn, abdominal pain, melena, lower extremity edema, claudication, or rash.  All other systems reviewed and negative  General: Affect appropriate Healthy:  appears stated age HEENT: normal Neck supple with no adenopathy JVP normal no bruits no thyromegaly Lungs clear with no wheezing and good diaphragmatic motion Heart:  S1/S2 no murmur, no rub, gallop or click PMI normal Abdomen: benighn, BS positve, no tenderness, no AAA no bruit.  No HSM or HJR Distal pulses intact with no bruits No edema Neuro non-focal Skin warm and dry No muscular weakness   Current Outpatient Prescriptions  Medication Sig Dispense Refill  . insulin glargine (LANTUS) 100 UNIT/ML injection Inject 42 Units into the skin at bedtime. Needs office visit  20 mL  0  . lisinopril-hydrochlorothiazide (PRINZIDE,ZESTORETIC) 20-25 MG per tablet Take 1 tablet by mouth daily. Needs office visit  30 tablet  0  . warfarin (COUMADIN) 5 MG tablet Take 5 mg by mouth daily. Taking 5 mg every day  except Friday and Monday taking 2.5 mg on Friday and Monday followed by Dr Sandria Manly        Allergies  Adhesive and Codeine  Electrocardiogram:  Assessment and Plan

## 2012-03-16 NOTE — Assessment & Plan Note (Signed)
Stable no symptoms f/u ECG in 6 months

## 2012-03-19 ENCOUNTER — Telehealth: Payer: Self-pay | Admitting: Family Medicine

## 2012-03-19 NOTE — Telephone Encounter (Signed)
Patient called saying his Lisinopril got denied because he needs OV. He said he came in 03/05/12 so he doesn't understand. Spoke with Rhoderick Moody PA-C and she gave verbal to refill with 6 refills. Called in refill to [facility]. Pt notified and voiced understanding.

## 2012-04-13 ENCOUNTER — Ambulatory Visit (INDEPENDENT_AMBULATORY_CARE_PROVIDER_SITE_OTHER): Payer: Medicare Other | Admitting: *Deleted

## 2012-04-13 DIAGNOSIS — I639 Cerebral infarction, unspecified: Secondary | ICD-10-CM

## 2012-04-13 DIAGNOSIS — Z5181 Encounter for therapeutic drug level monitoring: Secondary | ICD-10-CM

## 2012-04-13 DIAGNOSIS — I6350 Cerebral infarction due to unspecified occlusion or stenosis of unspecified cerebral artery: Secondary | ICD-10-CM

## 2012-04-13 DIAGNOSIS — Z7901 Long term (current) use of anticoagulants: Secondary | ICD-10-CM

## 2012-04-13 LAB — POCT INR: INR: 2.8

## 2012-04-16 ENCOUNTER — Telehealth: Payer: Self-pay

## 2012-04-16 MED ORDER — INSULIN GLARGINE 100 UNIT/ML ~~LOC~~ SOLN: 42.0000 [IU] | Freq: Every day | SUBCUTANEOUS | Status: DC

## 2012-04-16 NOTE — Telephone Encounter (Signed)
Called him he was here in Jan, he has plans to come in July again uses lantus 42 units at bedtime. Please advise. Pended.

## 2012-04-16 NOTE — Telephone Encounter (Signed)
PT STATES HE HAD TRIED TO GET HIS LANTUS REFILLED AND WAS TOLD BY THE PHARMACY HE NO LONGER COMES HERE, BUT HE DOES HE HAVE AN APPT WITH DR Cleta Alberts IN July. PLEASE CALL [phone]    [facility] ON [address]

## 2012-04-16 NOTE — Telephone Encounter (Signed)
Sent to pharmacy 

## 2012-04-18 ENCOUNTER — Telehealth: Payer: Self-pay

## 2012-04-18 NOTE — Telephone Encounter (Signed)
PT STATES HE NEED TO HAVE SOME TEST SCRIPTS CALLED IN FOR HIM. PLEASE CALL PT AT [phone]    [facility] ON [address]

## 2012-04-19 NOTE — Telephone Encounter (Signed)
Spoke with pt, he has reli-on ultima meter. Can we send in test strips for him?

## 2012-04-20 ENCOUNTER — Ambulatory Visit: Payer: Medicare Other | Admitting: Hematology & Oncology

## 2012-04-20 ENCOUNTER — Other Ambulatory Visit: Payer: Medicare Other | Admitting: Lab

## 2012-04-21 MED ORDER — GLUCOSE BLOOD VI STRP: ORAL_STRIP | Status: DC

## 2012-04-21 NOTE — Telephone Encounter (Signed)
Please send in test strips for whichever glucometer he has.

## 2012-04-21 NOTE — Telephone Encounter (Signed)
Printed, will fax to [facility]

## 2012-04-24 ENCOUNTER — Other Ambulatory Visit: Payer: Self-pay | Admitting: *Deleted

## 2012-04-24 MED ORDER — GLUCOSE BLOOD VI STRP: ORAL_STRIP | Status: DC

## 2012-05-17 ENCOUNTER — Other Ambulatory Visit (HOSPITAL_BASED_OUTPATIENT_CLINIC_OR_DEPARTMENT_OTHER): Payer: Medicare Other | Admitting: Lab

## 2012-05-17 ENCOUNTER — Ambulatory Visit (HOSPITAL_BASED_OUTPATIENT_CLINIC_OR_DEPARTMENT_OTHER): Payer: Medicare Other | Admitting: Medical

## 2012-05-17 VITALS — BP 130/55 | HR 56 | Temp 97.5°F | Resp 16 | Ht 71.0 in | Wt 205.0 lb

## 2012-05-17 DIAGNOSIS — C9110 Chronic lymphocytic leukemia of B-cell type not having achieved remission: Secondary | ICD-10-CM

## 2012-05-17 DIAGNOSIS — R21 Rash and other nonspecific skin eruption: Secondary | ICD-10-CM

## 2012-05-17 LAB — CBC WITH DIFFERENTIAL (CANCER CENTER ONLY)
BASO#: 0.1 10*3/uL (ref 0.0–0.2)
Eosinophils Absolute: 0.1 10*3/uL (ref 0.0–0.5)
HGB: 16.1 g/dL (ref 13.0–17.1)
LYMPH%: 74.6 % — ABNORMAL HIGH (ref 14.0–48.0)
MCH: 30.8 pg (ref 28.0–33.4)
MCV: 87 fL (ref 82–98)
MONO#: 1.2 10*3/uL — ABNORMAL HIGH (ref 0.1–0.9)
MONO%: 4.7 % (ref 0.0–13.0)
NEUT#: 5.2 10*3/uL (ref 1.5–6.5)
RBC: 5.23 10*6/uL (ref 4.20–5.70)

## 2012-05-17 LAB — TECHNOLOGIST REVIEW CHCC SATELLITE

## 2012-05-17 NOTE — Progress Notes (Signed)
DIAGNOSIS:  Stage A chronic lymphocytic leukemia.  CURRENT THERAPY:  Observation.  INTERIM HISTORY: [name] presents today for an office followup visit.  Overall, he, reports, that he's doing relatively well.  He states he still continues to have a rash on the top of his scalp.  He states, that it has been there for at least 6 months.  He, reports, that he was out, in the yard, and was dealing with some tree limbs, and such.  He states, that one of the limbs, brushed against the top of his head.  He does report some scabbing.  He does not report any open lesions.  He does report some pruritis with it. I did advise him to followup with the dermatologist.  We can go ahead and make that referral.  His white count is holding steady at 26.0.  He still remains asymptomatic.  In regards to the, CLL.  He's not had any problems with fevers, sweats or chills.  There's been no palpable lymph glands.  He's not reported any recurrent infections.  He's has a good, appetite.  He's not having any nausea, vomiting, diarrhea, constipation, chest pain, shortness of breath, or cough.  He denies any lower leg swelling, any obvious, or abnormal bleeding he denies any headaches or visual changes.  He does have diabetes, however, this seems to be under fairly good control.  He remains on Coumadin.   Review of Systems: Constitutional:Negative for malaise/fatigue, fever, chills, weight loss, diaphoresis, activity change, appetite change, and unexpected weight change.  HEENT: Negative for double vision, blurred vision, visual loss, ear pain, tinnitus, congestion, rhinorrhea, epistaxis sore throat or sinus disease, oral pain/lesion, tongue soreness Respiratory: Negative for cough, chest tightness, shortness of breath, wheezing and stridor.  Cardiovascular: Negative for chest pain, palpitations, leg swelling, orthopnea, PND, DOE or claudication Gastrointestinal: Negative for nausea, vomiting, abdominal pain, diarrhea,  constipation, blood in stool, melena, hematochezia, abdominal distention, anal bleeding, rectal pain, anorexia and hematemesis.  Genitourinary: Negative for dysuria, frequency, hematuria,  Musculoskeletal: Negative for myalgias, back pain, joint swelling, arthralgias and gait problem.  Skin: Negative for rash, color change, pallor and wound.  Neurological:. Negative for dizziness/light-headedness, tremors, seizures, syncope, facial asymmetry, speech difficulty, weakness, numbness, headaches and paresthesias.  Hematological: Negative for adenopathy. Does not bruise/bleed easily.  Psychiatric/Behavioral:  Negative for depression, no loss of interest in normal activity or change in sleep pattern.   Physical Exam: This is an elderly, 79 year old, white gentleman, in no obvious distress Vitals: Temperature 97.5 degrees, pulse 56, respirations 18, blood pressure 130/55.  Weight 205 pounds HEENT reveals a normocephalic, atraumatic skull, no scleral icterus, no oral lesions  Neck is supple without any cervical or supraclavicular adenopathy.  Lungs are clear to auscultation bilaterally. There are no wheezes, rales or rhonci Cardiac is regular rate and rhythm with a normal S1 and S2. There are no murmurs, rubs, or bruits.  Abdomen is soft with good bowel sounds, there is no palpable mass. There is no palpable hepatosplenomegaly. There is no palpable fluid wave.  Musculoskeletal no tenderness of the spine, ribs, or hips.  Extremities there are no clubbing, cyanosis, or edema.  Skin no petechia, purpura or ecchymosis.  He does have a diffuse erythematous rash on the top of his scalp.  There are no open lesions. Neurologic is nonfocal.  Laboratory Data: White count 26.0, platelets 16.1, hematocrit 45.5.  Platelets 158,000  Current Outpatient Prescriptions on File Prior to Visit  Medication Sig Dispense Refill  . glucose blood (  RELION GLUCOSE TEST STRIPS) test strip Test blood glucose 3 times a day. Dx  Code 250.00  300 each  4  . insulin glargine (LANTUS) 100 UNIT/ML injection Inject 42 Units into the skin at bedtime. Needs office visit  20 mL  0  . lisinopril-hydrochlorothiazide (PRINZIDE,ZESTORETIC) 20-25 MG per tablet Take 1 tablet by mouth daily. Needs office visit  30 tablet  0  . warfarin (COUMADIN) 5 MG tablet Take 5 mg by mouth daily. Taking 5 mg every day except Friday and Monday taking 2.5 mg on Friday and Monday followed by Dr Sandria Manly       No current facility-administered medications on file prior to visit.   Assessment/Plan: This is a pleasant, 79 year old, white gentleman, with the following issues:  #1.  Stage a chronic lymphocytic leukemia.  Overall, he is doing quite well with this.  He is not symptomatic.  At this time, he does not need any intervention for this.  #2.  Rash.  This has been ongoing for 6 months. This is on the top of his scalp.  We will go ahead and make a referral to dermatology.  #3.  Followup.  [name] will follow back up with [place] in 4 months, but before then should there be questions or concerns.

## 2012-05-18 ENCOUNTER — Telehealth: Payer: Self-pay | Admitting: Hematology & Oncology

## 2012-05-18 NOTE — Telephone Encounter (Signed)
Pt does not want to see dermatologist, appointment cancelled PA aware

## 2012-06-08 ENCOUNTER — Other Ambulatory Visit: Payer: Self-pay | Admitting: Physician Assistant

## 2012-09-04 ENCOUNTER — Encounter: Payer: Self-pay | Admitting: Emergency Medicine

## 2012-09-04 ENCOUNTER — Ambulatory Visit (INDEPENDENT_AMBULATORY_CARE_PROVIDER_SITE_OTHER): Payer: Medicare Other | Admitting: Emergency Medicine

## 2012-09-04 VITALS — BP 140/62 | HR 49 | Temp 97.4°F | Resp 16 | Ht 70.5 in | Wt 198.0 lb

## 2012-09-04 DIAGNOSIS — C9590 Leukemia, unspecified not having achieved remission: Secondary | ICD-10-CM

## 2012-09-04 DIAGNOSIS — E119 Type 2 diabetes mellitus without complications: Secondary | ICD-10-CM

## 2012-09-04 DIAGNOSIS — C61 Malignant neoplasm of prostate: Secondary | ICD-10-CM | POA: Insufficient documentation

## 2012-09-04 LAB — GLUCOSE, POCT (MANUAL RESULT ENTRY): POC Glucose: 57 mg/dl — AB (ref 70–99)

## 2012-09-04 LAB — POCT GLYCOSYLATED HEMOGLOBIN (HGB A1C): Hemoglobin A1C: 6.3

## 2012-09-04 NOTE — Progress Notes (Signed)
  Subjective:    Patient ID: Brandon Chambers, male    DOB: Jul 03, 1928, 79 y.o.   MRN: [id number]  HPI patient in for followup. He has diabetes and chronic leukocytic leukemia and is status post cerebrovascular accident. He initially was on Coumadin but is currently on Plavix for this. His sugar ran low in the morning and he decreased his Lantus at night. He continues on NovoLog meds sliding scale in the morning to take anywhere from 10-20 units. He has numbness in both feet and is known to have peripheral arterial disease for which he sees CVTS.Marland Kitchen    Review of Systems     Objective:   Physical Exam patient has extremely poor dentition. His neck is supple without carotid bruits. His chest is clear to auscultation and percussion heart is regular rate no murmurs abdomen soft nontender extremities reveal diminished pulses bilaterally with decreased sensation in both feet consistent with neuropathy. Results for orders placed in visit on 09/04/12  GLUCOSE, POCT (MANUAL RESULT ENTRY)      Result Value Range   POC Glucose 57 (*) 70 - 99 mg/dl  POCT GLYCOSYLATED HEMOGLOBIN (HGB A1C)      Result Value Range   Hemoglobin A1C 6.3           Assessment & Plan:  No change in occasions at present he R. he decreased his Lantus to 30 units at night. I told him if he continued to have low sugars in the morning he could decrease this even more. I did give him some crackers and juice prior to him leaving. Patient refuses referral for colonoscopy. He will follow up with Dr. Myna Hidalgo. he will followup with his urologist and he will followup with his cardiologist.

## 2012-09-05 LAB — COMPREHENSIVE METABOLIC PANEL
ALT: 9 U/L (ref 0–53)
AST: 14 U/L (ref 0–37)
Albumin: 4.3 g/dL (ref 3.5–5.2)
Alkaline Phosphatase: 59 U/L (ref 39–117)
Glucose, Bld: 78 mg/dL (ref 70–99)
Potassium: 4.1 mEq/L (ref 3.5–5.3)
Sodium: 141 mEq/L (ref 135–145)
Total Protein: 6.3 g/dL (ref 6.0–8.3)

## 2012-09-17 ENCOUNTER — Other Ambulatory Visit (HOSPITAL_BASED_OUTPATIENT_CLINIC_OR_DEPARTMENT_OTHER): Payer: Medicare Other | Admitting: Lab

## 2012-09-17 ENCOUNTER — Ambulatory Visit (HOSPITAL_BASED_OUTPATIENT_CLINIC_OR_DEPARTMENT_OTHER): Payer: Medicare Other | Admitting: Hematology & Oncology

## 2012-09-17 VITALS — BP 124/51 | HR 57 | Temp 97.7°F | Resp 20 | Wt 200.0 lb

## 2012-09-17 DIAGNOSIS — C9110 Chronic lymphocytic leukemia of B-cell type not having achieved remission: Secondary | ICD-10-CM

## 2012-09-17 LAB — CBC WITH DIFFERENTIAL (CANCER CENTER ONLY)
EOS%: 0.5 % (ref 0.0–7.0)
LYMPH%: 69.6 % — ABNORMAL HIGH (ref 14.0–48.0)
MCH: 31.4 pg (ref 28.0–33.4)
MCHC: 34.5 g/dL (ref 32.0–35.9)
MCV: 91 fL (ref 82–98)
MONO%: 4.1 % (ref 0.0–13.0)
NEUT#: 5.4 10*3/uL (ref 1.5–6.5)
Platelets: 155 10*3/uL (ref 145–400)
RDW: 13.2 % (ref 11.1–15.7)

## 2012-09-17 LAB — CHCC SATELLITE - SMEAR

## 2012-09-17 NOTE — Progress Notes (Signed)
This office note has been dictated.

## 2012-09-18 NOTE — Progress Notes (Signed)
CC:   Stan Head. Cleta Alberts, M.D. Bertram Millard. Dahlstedt, M.D. Noralyn Pick. Eden Emms, MD, [facility]  DIAGNOSIS:  Stage A chronic lymphocytic leukemia.  CURRENT THERAPY:  Observation.  INTERIM HISTORY:  Mr. Cocuzza comes in for followup.  He is doing quite well.  He has had no problems with bleeding or bruising.  He has had no fevers, sweats, or chills.  He has had no weight loss or weight gain. He is taking care of his wife who has bad dementia.  As such, they really cannot go anywhere.  PHYSICAL EXAMINATION:  General:  This is a well-developed, well- nourished white gentleman in no obvious distress.  Vital signs: Temperature of 97.7, pulse 57, respiratory 20, blood pressure 124/51. Weight is 200 pounds.  Head and neck:  Normocephalic, atraumatic skull. There are no ocular or oral lesions.  There are no palpable cervical or supraclavicular lymph nodes.  Lungs:  Clear bilaterally.  Cardiac: Regular rate and rhythm with a normal S1 and S2.  There are no murmurs, rubs or bruits.  Abdomen:  Soft.  He has good bowel sounds.  There is no fluid wave.  There is no palpable hepatosplenomegaly:  No tenderness over the spine, ribs, or hips.  Extremities:  Show missing 4th finger on the right hand.  He has bad arthritis in his extremities.  Skin:  No rash, ecchymosis, or petechia.  LABORATORY STUDIES:  White cell count is 20.9, hemoglobin 15.8, hematocrit 45.8, platelet count 155.  White cell differential shows 26 segs, 70 lymphocytes.  IMPRESSION:  Mr. Bonner is an 79 year old gentleman with stage A chronic lymphocytic leukemia.  So far, there has been no evidence of progression of disease.  Given the limitations that he has with coming here, particularly with his wife, I think we can probably get him back in 6 months' time.  He certainly can come back sooner if we do find any issues.    ______________________________ Josph Macho, M.D. PRE/MEDQ  D:  09/17/2012  T:  09/18/2012  Job:  [id number]

## 2012-10-11 ENCOUNTER — Ambulatory Visit: Payer: Self-pay | Admitting: Internal Medicine

## 2012-10-11 DIAGNOSIS — Z7901 Long term (current) use of anticoagulants: Secondary | ICD-10-CM

## 2012-10-11 DIAGNOSIS — I639 Cerebral infarction, unspecified: Secondary | ICD-10-CM

## 2012-10-16 ENCOUNTER — Other Ambulatory Visit: Payer: Self-pay | Admitting: Emergency Medicine

## 2012-11-04 ENCOUNTER — Other Ambulatory Visit: Payer: Self-pay | Admitting: Emergency Medicine

## 2012-12-07 ENCOUNTER — Other Ambulatory Visit: Payer: Self-pay

## 2012-12-07 MED ORDER — INSULIN GLARGINE 100 UNIT/ML ~~LOC~~ SOLN: SUBCUTANEOUS | Status: DC

## 2013-02-15 ENCOUNTER — Other Ambulatory Visit: Payer: Self-pay

## 2013-02-15 NOTE — Telephone Encounter (Signed)
Dr Everlene Farrier, received req from [place] mail order for 90 day RFs of Lantus, Novolog 70/30 and lisin-HCTZ. Pt is due for f/up, but has appt sch for 03/12/13. Can we get him a 3 mos supply? I have pended them

## 2013-02-16 MED ORDER — INSULIN ASPART PROT & ASPART (70-30 MIX) 100 UNIT/ML ~~LOC~~ SUSP: SUBCUTANEOUS | Status: DC

## 2013-02-16 MED ORDER — INSULIN GLARGINE 100 UNIT/ML ~~LOC~~ SOLN: SUBCUTANEOUS | Status: DC

## 2013-02-16 MED ORDER — LISINOPRIL-HYDROCHLOROTHIAZIDE 20-25 MG PO TABS: ORAL_TABLET | ORAL | Status: DC

## 2013-02-19 ENCOUNTER — Telehealth: Payer: Self-pay

## 2013-02-19 NOTE — Telephone Encounter (Signed)
[employer] req'd clarification whether pt uses vials or pens of Lantus and Novolog. Called pt to verify and faxed [employer] that vials are used.

## 2013-02-21 ENCOUNTER — Telehealth: Payer: Self-pay

## 2013-02-21 MED ORDER — INSULIN GLARGINE 100 UNIT/ML ~~LOC~~ SOLN: SUBCUTANEOUS | Status: DC

## 2013-02-21 MED ORDER — INSULIN ASPART PROT & ASPART (70-30 MIX) 100 UNIT/ML ~~LOC~~ SUSP: SUBCUTANEOUS | Status: DC

## 2013-02-21 NOTE — Telephone Encounter (Signed)
Pt reported [employer] could not read the Rxs for lantus and novolog that were sent and asked that we resend them. Done.

## 2013-03-12 ENCOUNTER — Ambulatory Visit (INDEPENDENT_AMBULATORY_CARE_PROVIDER_SITE_OTHER): Payer: Medicare HMO | Admitting: Emergency Medicine

## 2013-03-12 ENCOUNTER — Ambulatory Visit: Payer: Medicare Other | Admitting: Emergency Medicine

## 2013-03-12 ENCOUNTER — Encounter: Payer: Self-pay | Admitting: Emergency Medicine

## 2013-03-12 VITALS — BP 138/82 | HR 46 | Temp 97.5°F | Resp 18 | Ht 70.0 in | Wt 185.0 lb

## 2013-03-12 DIAGNOSIS — E119 Type 2 diabetes mellitus without complications: Secondary | ICD-10-CM

## 2013-03-12 DIAGNOSIS — L989 Disorder of the skin and subcutaneous tissue, unspecified: Secondary | ICD-10-CM

## 2013-03-12 DIAGNOSIS — I498 Other specified cardiac arrhythmias: Secondary | ICD-10-CM

## 2013-03-12 DIAGNOSIS — C9110 Chronic lymphocytic leukemia of B-cell type not having achieved remission: Secondary | ICD-10-CM

## 2013-03-12 DIAGNOSIS — R001 Bradycardia, unspecified: Secondary | ICD-10-CM

## 2013-03-12 LAB — GLUCOSE, POCT (MANUAL RESULT ENTRY): POC Glucose: 123 mg/dl — AB (ref 70–99)

## 2013-03-12 LAB — CBC
HEMATOCRIT: 46.6 % (ref 39.0–52.0)
Hemoglobin: 16.3 g/dL (ref 13.0–17.0)
MCH: 31.7 pg (ref 26.0–34.0)
MCHC: 35 g/dL (ref 30.0–36.0)
MCV: 90.5 fL (ref 78.0–100.0)
Platelets: 148 10*3/uL — ABNORMAL LOW (ref 150–400)
RBC: 5.15 MIL/uL (ref 4.22–5.81)
RDW: 13.6 % (ref 11.5–15.5)
WBC: 23.2 10*3/uL — AB (ref 4.0–10.5)

## 2013-03-12 LAB — POCT GLYCOSYLATED HEMOGLOBIN (HGB A1C): Hemoglobin A1C: 6.3

## 2013-03-12 NOTE — Progress Notes (Addendum)
Subjective:  This chart was scribed for Arlyss Queen, MD by Roxan Diesel, ED scribe.  This patient was seen in [place] [facility] and the patient's care was started at 11:42 AM.   Patient ID: Brandon Chambers, male    DOB: June 01, 1928, 80 y.o.   MRN: [id number]  Chief Complaint  Patient presents with  . six month check up    HPI  HPI Comments: Brandon Chambers is a 80 y.o. male who presents to [facility] for a 12-month checkup.    Pt was last seen here on 09/04/12.  He states he has been feeling well.  He has no specific complaints at this time.  He states he has some pain in his legs occasionally but he attributes this to normal pains associated with his age.  He denies CP, abdominal pain, weakness, or dizzeness.  He does have spots on his head which he has never seen a dermatologist for.  He has been applying topical ointment to the spots.  He did receive a his flu shot this year.  Oncologist is Dr. Marin Olp Urologist is Dr. Diona Fanti Cardiologist is at [facility]   Patient Active Problem List   Diagnosis Date Noted  . Prostate cancer 09/04/2012  . CLL (chronic lymphocytic leukemia) 03/05/2012  . Anticoagulated on Coumadin 03/05/2012  . CVA (cerebral vascular accident) 03/05/2012  . AV block 10/21/2011  . Diabetes mellitus   . Hypercholesteremia   . Hypertension   . SOB (shortness of breath)     Past Medical History  Diagnosis Date  . Diabetes mellitus   . Hypercholesteremia   . Hypertension   . SOB (shortness of breath)     No past surgical history on file.  Prior to Admission medications   Medication Sig Start Date End Date Taking? Authorizing Provider  clopidogrel (PLAVIX) 75 MG tablet Take 75 mg by mouth daily.  04/17/12  Yes Historical Provider, MD  glucose blood (RELION GLUCOSE TEST STRIPS) test strip Test blood glucose 3 times a day. Dx Code 250.00 04/24/12  Yes Heather Elnora Morrison, PA-C  insulin aspart protamine- aspart (NOVOLOG MIX 70/30) (70-30) 100 UNIT/ML injection  INJECT 15-20 UNITS SUBCUTANEOUSLY WITH BREAKFAST DEPENDING ON WHAT GLUCOSE LEVEL IS 02/21/13  Yes Mancel Bale, PA-C  insulin glargine (LANTUS) 100 UNIT/ML injection Inject 42 units subcutaneously at bedtime. 02/21/13  Yes Sarah Alleen Borne, PA-C  lisinopril-hydrochlorothiazide (PRINZIDE,ZESTORETIC) 20-25 MG per tablet TAKE ONE TABLET BY MOUTH EVERY DAY 02/15/13  Yes Darlyne Russian, MD     Review of Systems  Cardiovascular: Negative for chest pain.  Gastrointestinal: Negative for abdominal pain.  Musculoskeletal: Positive for arthralgias.  Skin:       Spots on head  Neurological: Negative for dizziness and weakness.  All other systems reviewed and are negative.        Objective:   Physical Exam CONSTITUTIONAL: Well developed/well nourished HEAD: Normocephalic/atraumatic EYES: EOMI/PERRL ENMT: Mucous membranes moist NECK: supple no meningeal signs SPINE:entire spine nontender CV: S1/S2 noted, no murmurs/rubs/gallops noted LUNGS: Lungs are clear to auscultation bilaterally, no apparent distress ABDOMEN: soft, nontender, no rebound or guarding GU:no cva tenderness NEURO: Pt is awake/alert, moves all extremitiesx4 he does have some mild disarticulation with speech EXTREMITIES: pulses normal, full ROM SKIN: warm, color normal. There are multiple areas on the scalp of skin damage with 1 lesion on the frontal area about a centimeter and a half very suspicious for skin cancer. PSYCH: no abnormalities of mood noted   BP 138/82  Pulse 46  Temp(Src) 97.5 F (36.4 C) (Oral)  Resp 18  Ht 5\' 10"  (1.778 m)  Wt 185 lb (83.915 kg)  BMI 26.54 kg/m2  SpO2 96%    Results for orders placed in visit on 03/12/13  POCT GLYCOSYLATED HEMOGLOBIN (HGB A1C)      Result Value Range   Hemoglobin A1C 6.3    GLUCOSE, POCT (MANUAL RESULT ENTRY)      Result Value Range   POC Glucose 123 (*) 70 - 99 mg/dl      Assessment & Plan:  Patient doing well no changes in medication. EKG is stable. He does have a  trifascicular block. His A1c is excellent. His biggest problem much personal timeis caring for his wife with Alzheimer's which does not allow him much personal time.     I personally performed the services described in this documentation, which was scribed in my presence. The recorded information has been reviewed and is accurate.

## 2013-03-13 LAB — COMPLETE METABOLIC PANEL WITH GFR
ALT: 10 U/L (ref 0–53)
AST: 13 U/L (ref 0–37)
Albumin: 4.3 g/dL (ref 3.5–5.2)
Alkaline Phosphatase: 61 U/L (ref 39–117)
BILIRUBIN TOTAL: 1 mg/dL (ref 0.3–1.2)
BUN: 18 mg/dL (ref 6–23)
CHLORIDE: 103 meq/L (ref 96–112)
CO2: 31 mEq/L (ref 19–32)
CREATININE: 1.06 mg/dL (ref 0.50–1.35)
Calcium: 10 mg/dL (ref 8.4–10.5)
GFR, Est African American: 75 mL/min
GFR, Est Non African American: 65 mL/min
Glucose, Bld: 132 mg/dL — ABNORMAL HIGH (ref 70–99)
Potassium: 4.2 mEq/L (ref 3.5–5.3)
Sodium: 140 mEq/L (ref 135–145)
Total Protein: 6.1 g/dL (ref 6.0–8.3)

## 2013-03-13 LAB — TSH: TSH: 2.984 u[IU]/mL (ref 0.350–4.500)

## 2013-03-18 ENCOUNTER — Other Ambulatory Visit: Payer: Medicare Other | Admitting: Lab

## 2013-03-18 ENCOUNTER — Ambulatory Visit: Payer: Medicare Other | Admitting: Hematology & Oncology

## 2013-04-08 ENCOUNTER — Telehealth: Payer: Self-pay | Admitting: Hematology & Oncology

## 2013-04-08 NOTE — Telephone Encounter (Signed)
Pt called made 3-16 appointment. He cx 2-2 he said because he didn't want to see the PA

## 2013-04-15 ENCOUNTER — Other Ambulatory Visit: Payer: Self-pay | Admitting: Physician Assistant

## 2013-04-16 ENCOUNTER — Other Ambulatory Visit: Payer: Self-pay | Admitting: Emergency Medicine

## 2013-04-29 ENCOUNTER — Other Ambulatory Visit (HOSPITAL_BASED_OUTPATIENT_CLINIC_OR_DEPARTMENT_OTHER): Payer: Commercial Managed Care - HMO | Admitting: Lab

## 2013-04-29 ENCOUNTER — Ambulatory Visit (HOSPITAL_BASED_OUTPATIENT_CLINIC_OR_DEPARTMENT_OTHER): Payer: Commercial Managed Care - HMO | Admitting: Hematology & Oncology

## 2013-04-29 VITALS — BP 96/76 | HR 79 | Temp 97.4°F | Resp 18 | Wt 183.0 lb

## 2013-04-29 DIAGNOSIS — C9110 Chronic lymphocytic leukemia of B-cell type not having achieved remission: Secondary | ICD-10-CM

## 2013-04-29 LAB — CBC WITH DIFFERENTIAL (CANCER CENTER ONLY)
BASO#: 0.1 10*3/uL (ref 0.0–0.2)
BASO%: 0.6 % (ref 0.0–2.0)
EOS%: 0.3 % (ref 0.0–7.0)
Eosinophils Absolute: 0.1 10*3/uL (ref 0.0–0.5)
HEMATOCRIT: 44.9 % (ref 38.7–49.9)
HGB: 15.2 g/dL (ref 13.0–17.1)
LYMPH#: 14.4 10*3/uL — AB (ref 0.9–3.3)
LYMPH%: 69.2 % — ABNORMAL HIGH (ref 14.0–48.0)
MCH: 30.8 pg (ref 28.0–33.4)
MCHC: 33.9 g/dL (ref 32.0–35.9)
MCV: 91 fL (ref 82–98)
MONO#: 0.8 10*3/uL (ref 0.1–0.9)
MONO%: 3.6 % (ref 0.0–13.0)
NEUT#: 5.5 10*3/uL (ref 1.5–6.5)
NEUT%: 26.3 % — ABNORMAL LOW (ref 40.0–80.0)
PLATELETS: 150 10*3/uL (ref 145–400)
RBC: 4.93 10*6/uL (ref 4.20–5.70)
RDW: 13.4 % (ref 11.1–15.7)
WBC: 20.8 10*3/uL — ABNORMAL HIGH (ref 4.0–10.0)

## 2013-04-29 NOTE — Progress Notes (Signed)
Hematology and Oncology Follow Up Visit  Brandon Chambers [id number] 1928/07/05 80 y.o. 04/29/2013   Principle Diagnosis:   Stage A CLL  Current Therapy:    Observation     Interim History:  Mr.  Chambers is him back for followup. Last him back in August. Since then, he has found to have a squamous cell carcinoma of the scalp. Is going to be seen by dermatologist in a month I did have this removed. It sounds like they may do Mohs surgery.  Otherwise he's done well. He's not having any issues with infections. His not noted any swollen lymph glands. He's had no weight loss or weight gain. He's had no bleeding or bruising. There's been no change in bowel or bladder habits.  He is a diabetic. This, will be his most likely source of long-term issues.  Medications: Current outpatient prescriptions:clopidogrel (PLAVIX) 75 MG tablet, Take 75 mg by mouth daily. , Disp: , Rfl: ;  glucose blood (RELION GLUCOSE TEST STRIPS) test strip, Test blood glucose 3 times a day. Dx Code 250.00, Disp: 300 each, Rfl: 4;  insulin aspart protamine- aspart (NOVOLOG MIX 70/30) (70-30) 100 UNIT/ML injection, INJECT 15-20 UNITS SUBCUTANEOUSLY WITH BREAKFAST DEPENDING ON WHAT GLUCOSE LEVEL IS, Disp: 20 mL, Rfl: 0 insulin glargine (LANTUS) 100 UNIT/ML injection, Inject 42 units subcutaneously at bedtime., Disp: 40 mL, Rfl: 0;  lisinopril-hydrochlorothiazide (PRINZIDE,ZESTORETIC) 20-25 MG per tablet, TAKE 1 TABLET EVERY DAY, Disp: 90 tablet, Rfl: 1  Allergies:  Allergies  Allergen Reactions  . Adhesive [Tape] Rash  . Codeine Rash    Past Medical History, Surgical history, Social history, and Family History were reviewed and updated.  Review of Systems: As above  Physical Exam:  weight is 183 lb (83.008 kg). His temperature is 97.4 F (36.3 C). His blood pressure is 96/76 and his pulse is 79. His respiration is 18.   We'll go well-nourished gentleman. There is no lymphadenopathy. Lungs are clear. Cardiac exam  regular rate and rhythm with an occasional extra beat. He is a 1/6 systolic ejection murmur. Abdomen is soft. There is no palpable liver or spleen tip. Axillary exam shows no bilateral axillary adenopathy. Back exam no tenderness over the spine ribs or hips. Extremities shows some diabetic changes in the lower legs. He has a little bit of a rash in the lower legs. He has some slight muscle atrophy and upper and lower extremities. Skin exam shows no rashes, outside physician with his lower legs. Neurological exam no focal neurological deficits.  Lab Results  Component Value Date   WBC 20.8* 04/29/2013   HGB 15.2 04/29/2013   HCT 44.9 04/29/2013   MCV 91 04/29/2013   PLT 150 04/29/2013     Chemistry      Component Value Date/Time   NA 140 03/12/2013 1206   K 4.2 03/12/2013 1206   CL 103 03/12/2013 1206   CO2 31 03/12/2013 1206   BUN 18 03/12/2013 1206   CREATININE 1.06 03/12/2013 1206   CREATININE 0.73 01/23/2008 0400      Component Value Date/Time   CALCIUM 10.0 03/12/2013 1206   ALKPHOS 61 03/12/2013 1206   AST 13 03/12/2013 1206   ALT 10 03/12/2013 1206   BILITOT 1.0 03/12/2013 1206         Impression and Plan: Brandon Chambers is an 80 year-old gentleman. He has CLL. Review. We've been seeing him for about one year.Marland Kitchen His blood counts clearly have not progressed.  His wife is not doing well.  She has Alzheimer's. He has a tough time coming out..  I think we can probably get him back in one year.   Volanda Napoleon, MD 3/16/20153:39 PM

## 2013-05-02 ENCOUNTER — Telehealth: Payer: Self-pay | Admitting: Neurology

## 2013-05-02 NOTE — Telephone Encounter (Signed)
I reassigned to Dr. Leonie Man.  Hx stroke.  Last ofv note 02-13-12, Dr. Erling Cruz spoke to Dr. Leonie Man about med rec 04-13-12.  Pt taking plavix.

## 2013-05-02 NOTE — Telephone Encounter (Signed)
Pt requesting refill. Please advise °

## 2013-05-02 NOTE — Telephone Encounter (Signed)
Former [facility] patient that needs new MD assigned, will forward to [address] for assignment.

## 2013-05-02 NOTE — Telephone Encounter (Signed)
Patient is former [facility] patient, needs to be reassigned. Patient also requesting refill of Clopidogrel.

## 2013-05-03 ENCOUNTER — Telehealth: Payer: Self-pay | Admitting: Neurology

## 2013-05-03 MED ORDER — CLOPIDOGREL BISULFATE 75 MG PO TABS: 75.0000 mg | ORAL_TABLET | Freq: Every day | ORAL | Status: DC

## 2013-05-03 NOTE — Telephone Encounter (Signed)
Rx has been sent. Patient is aware. 

## 2013-05-03 NOTE — Telephone Encounter (Signed)
Pt called to set up an apt and see if he can get his medication clopidogrel (PLAVIX) 75 MG tablet. Please call pt when this has been called in. Thanks

## 2013-07-19 ENCOUNTER — Telehealth: Payer: Self-pay | Admitting: Neurology

## 2013-07-19 ENCOUNTER — Encounter: Payer: Self-pay | Admitting: Neurology

## 2013-07-19 NOTE — Telephone Encounter (Signed)
Spoke to patient about rescheduling 08/14/13 appointment per Dr. Clydene Fake schedule. Printed and mailed letter with new appointment time.

## 2013-07-30 ENCOUNTER — Telehealth: Payer: Self-pay

## 2013-07-30 NOTE — Telephone Encounter (Signed)
[place] from Dr. Theressa Stamps office called requesting twice that we do a [employer] referral on patient. There is no recent referral except for January 2015. Need to call there office to clarify what referral. Patient has an upcoming appointment. Just wanted to document to do.

## 2013-07-31 NOTE — Telephone Encounter (Signed)
Referrals have reqeusted a [employer] referral and information sent to brittany at dr stonesypher's office

## 2013-08-08 ENCOUNTER — Telehealth: Payer: Self-pay

## 2013-08-08 NOTE — Telephone Encounter (Signed)
[facility] is requesting a referral for patient to be seen for a three month follow-up - [employer]

## 2013-08-09 NOTE — Telephone Encounter (Signed)
Brandon Chambers states that they did not receive the prior auth information from the referral sent in January. Pt needs appt for 3  Month follow up. I looked in the referral and do not see the auth number. Please advise.

## 2013-08-09 NOTE — Telephone Encounter (Signed)
Brandon Herrlich857 [phone]

## 2013-08-09 NOTE — Telephone Encounter (Signed)
Contacted [facility] and gave them authoruzation that they needed for [facility]

## 2013-08-14 ENCOUNTER — Ambulatory Visit: Payer: Medicare HMO | Admitting: Neurology

## 2013-08-27 ENCOUNTER — Ambulatory Visit (INDEPENDENT_AMBULATORY_CARE_PROVIDER_SITE_OTHER): Payer: Medicare HMO | Admitting: Neurology

## 2013-08-27 ENCOUNTER — Encounter: Payer: Self-pay | Admitting: Neurology

## 2013-08-27 VITALS — BP 162/63 | HR 60 | Ht 69.5 in | Wt 192.0 lb

## 2013-08-27 DIAGNOSIS — I639 Cerebral infarction, unspecified: Secondary | ICD-10-CM

## 2013-08-27 DIAGNOSIS — I6350 Cerebral infarction due to unspecified occlusion or stenosis of unspecified cerebral artery: Secondary | ICD-10-CM

## 2013-08-27 MED ORDER — ATORVASTATIN CALCIUM 40 MG PO TABS: 40.0000 mg | ORAL_TABLET | Freq: Every day | ORAL | Status: DC

## 2013-08-27 NOTE — Patient Instructions (Addendum)
1. Follow up with PCP Dr. Everlene Farrier in about one month 2. Control BP and glucose  3. Continue current medication but measure BP and glucose closely at home 4. Continue the plavix 5. Start lipitor 40mg  daily 6. Test A1C, lipid panel, TSH, B12 7. Carotid doppler to be scheduled 8. Follow up in 3 months.

## 2013-08-27 NOTE — Progress Notes (Signed)
STROKE NEUROLOGY FOLLOW UP NOTE  NAME: Brandon Chambers DOB: [birth year]  REASON FOR VISIT: stroke follow up  HISTORY FROM: patient and chart  Today we had the pleasure of seeing Brandon Chambers in follow-up at our Neurology Clinic. Pt was accompanied by no one.   History Summary Pt was last seen on 02/13/12 by Dr. Erling Cruz. As per the last note, "80 yo M first seen 09/20/95 for a hx of dizziness, associated with nausea but no vomiting, blurry vision and slurry speech. He had hx of HTN, DM and left brain stroke in 1992. MRi brain showed small bilateral chronic appearing thalamic lacunar strokes, a tiny left pontine lacunar infarction, and basilar artery with stenosis which was not flow limiting. Carotid doppler normal. He was put on Ticlid but continued to have symptoms and low dose coumadin started. He was [facility] to [facility] 3[facility] with MRI showing new pontine medullary infarct left to midline and MRA showed severe focal distal right VA or proximal BA stenosis. Therefore, full dose coumadin was instituted. He had no symptoms since 1998. He was last seen 01/2012 by Dr. Erling Cruz and at that time, he still on coumadin with therapeutic INR.  Interval History During the interval time, the patient has been doing well, did not have any recurrent stroke like symptoms. His coumadin has been switched to plavix for a year. He was diagnosed with CLL due to high WBC counts and high percentage of lymphatic cells. He follows with Dr. Jonette Eva for CLL and so far no chemo or radiation due to stable cell counts. He was also recently had squamous cell carcinoma removed on the top of forehead. Remotely, he had right 4th finger skin cancer removed with amputation of 4th finger. He has no complains. His wife had AD and pt has been taking care of her. She passed away about 2 weeks ago. Currently pt was living by her own but daughters and sons are frequently checking on him. Next month, he is going to have both eye  cataract surgery done. He stated that his DM and HTN was in good control at home.  The following represents the patient's updated allergies and side effects list: Allergies  Allergen Reactions  . Adhesive [Tape] Rash  . Codeine Rash    Labs since last visit of relevance include the following: Results for orders placed in visit on 04/29/13  CBC WITH DIFFERENTIAL ([facility])      Result Value Ref Range   WBC 20.8 (*) 4.0 - 10.0 10e3/uL   RBC 4.93  4.20 - 5.70 10e6/uL   HGB 15.2  13.0 - 17.1 g/dL   HCT 44.9  38.7 - 49.9 %   MCV 91  82 - 98 fL   MCH 30.8  28.0 - 33.4 pg   MCHC 33.9  32.0 - 35.9 g/dL   RDW 13.4  11.1 - 15.7 %   Platelets 150  145 - 400 10e3/uL   NEUT# 5.5  1.5 - 6.5 10e3/uL   LYMPH# 14.4 (*) 0.9 - 3.3 10e3/uL   MONO# 0.8  0.1 - 0.9 10e3/uL   Eosinophils Absolute 0.1  0.0 - 0.5 10e3/uL   BASO# 0.1  0.0 - 0.2 10e3/uL   NEUT% 26.3 (*) 40.0 - 80.0 %   LYMPH% 69.2 (*) 14.0 - 48.0 %   MONO% 3.6  0.0 - 13.0 %   EOS% 0.3  0.0 - 7.0 %   BASO% 0.6  0.0 - 2.0 %  The neurologically relevant items on the patient's problem list were reviewed on today's visit.  Neurologic Examination  A problem focused neurological exam (12 or more points of the single system neurologic examination, vital signs counts as 1 point, cranial nerves count for 8 points) was performed.  Blood pressure 162/63, pulse 60, height 5' 9.5" (1.765 m), weight 192 lb (87.091 kg).  General - Well nourished, well developed, in no apparent distress.  Ophthalmologic - not able to see through.  Cardiovascular - Regular rate and rhythm with no murmur.  Mental Status -  Level of arousal and orientation to time, place, and person were intact. Language including expression, naming, repetition, comprehension was assessed and found intact.  Cranial Nerves II - XII - II - Vision intact OU. III, IV, VI - Extraocular movements intact. V - Facial sensation intact bilaterally. VII - Facial movement intact  bilaterally. VIII - Hearing & vestibular intact bilaterally. X - Palate elevates symmetrically. XI - Chin turning & shoulder shrug intact bilaterally XII - Tongue protrusion intact.  Motor Strength - The patient's strength was normal in all extremities and pronator drift was absent.  Bulk was normal and fasciculations were absent.   Motor Tone - Muscle tone was assessed at the neck and appendages and was normal.  Reflexes - The patient's reflexes were decreased in all extremities and he had no pathological reflexes.  Sensory - Light touch, temperature/pinprick, vibration and proprioception, and Romberg testing were assessed and were normal.    Coordination - The patient had normal movements in the hands and feet with no ataxia or dysmetria.  Tremor was absent.  Gait and Station - small stride, but steady, en bloc turns.  Data reviewed: I personally reviewed the images and agree with the radiology interpretations.  LDL 2.98 (09/2011), A1C 6.3 (02/2013), TSH 2.98 (02/2013)  Assessment: As you may recall, he is a 80 y.o.  Caucasian male with PMH of HTN, HLD, and DM followed up with neurology for a diagnosis of prior stroke and BA stenosis. Was on coumadin but now on plavix. Last seen was 01/2012. No neurological deficit seen on exam and he stated that BP and glucose were under control at home. Will check stroke labs this time as well as carotid doppler. Also add statin for stroke prevention, continue plavix.   Diagnoses from this visit: CVA (cerebral vascular accident) - Plan: US Carotid Bilateral, atorvastatin (LIPITOR) 40 MG tablet, Hemoglobin A1C, Lipid panel, Hepatic function panel, TSH + free T4, Vitamin B12  Plan:  - stroke labs today - carotid doppler  - continue plavix and add lipitor for HDL - follow up with PCP for glucose and BP control - risk factor modification - follow up in clinic in 3 months.  Orders Placed This Encounter  Procedures  . US Carotid Bilateral  .  Hemoglobin A1C  . Lipid panel  . Hepatic function panel  . TSH + free T4  . Vitamin B12   Patient Instructions  1. Follow up with PCP Dr. Everlene Farrier in about one month 2. Control BP and glucose  3. Continue current medication but measure BP and glucose closely at home 4. Continue the plavix 5. Start lipitor 40mg  daily 6. Test A1C, lipid panel, TSH, B12 7. Carotid doppler to be scheduled 8. Follow up in 3 months.   Rosalin Hawking, MD PhD [facility] [address] [place],  [address] [phone]

## 2013-08-29 ENCOUNTER — Other Ambulatory Visit (INDEPENDENT_AMBULATORY_CARE_PROVIDER_SITE_OTHER): Payer: Self-pay

## 2013-08-29 DIAGNOSIS — Z0289 Encounter for other administrative examinations: Secondary | ICD-10-CM

## 2013-08-30 ENCOUNTER — Other Ambulatory Visit: Payer: Self-pay | Admitting: Neurology

## 2013-08-30 LAB — LIPID PANEL
CHOL/HDL RATIO: 3.8 ratio (ref 0.0–5.0)
Cholesterol, Total: 180 mg/dL (ref 100–199)
HDL: 47 mg/dL (ref >39)
LDL Calculated: 119 mg/dL — ABNORMAL HIGH (ref 0–99)
TRIGLYCERIDES: 69 mg/dL (ref 0–149)
VLDL Cholesterol Cal: 14 mg/dL (ref 5–40)

## 2013-08-30 LAB — HEPATIC FUNCTION PANEL
ALT: 8 IU/L (ref 0–44)
AST: 13 IU/L (ref 0–40)
Albumin: 4.3 g/dL (ref 3.5–4.7)
Alkaline Phosphatase: 69 IU/L (ref 39–117)
BILIRUBIN DIRECT: 0.18 mg/dL (ref 0.00–0.40)
BILIRUBIN TOTAL: 0.6 mg/dL (ref 0.0–1.2)
Total Protein: 5.8 g/dL — ABNORMAL LOW (ref 6.0–8.5)

## 2013-08-30 LAB — HEMOGLOBIN A1C
ESTIMATED AVERAGE GLUCOSE: 137 mg/dL
HEMOGLOBIN A1C: 6.4 % — AB (ref 4.8–5.6)

## 2013-08-30 LAB — TSH+FREE T4
Free T4: 1.25 ng/dL (ref 0.82–1.77)
TSH: 5.27 u[IU]/mL — ABNORMAL HIGH (ref 0.450–4.500)

## 2013-08-30 LAB — VITAMIN B12: Vitamin B-12: 240 pg/mL (ref 211–946)

## 2013-08-30 MED ORDER — VITAMIN B-12 1000 MCG PO TABS: 1000.0000 ug | ORAL_TABLET | Freq: Every day | ORAL | Status: AC

## 2013-08-30 NOTE — Progress Notes (Signed)
Quick Note:  I called pt and relayed the lab results. He understands about the B12 level and will start supplement (wlamart at [employer]) on Monday. ______

## 2013-08-30 NOTE — Progress Notes (Signed)
Quick Note:  [name] B12 level was low. Please tell him that I already ordered B12 supplement for him and he can pick up from pharmacy. Thanks. ______

## 2013-09-06 ENCOUNTER — Other Ambulatory Visit: Payer: Self-pay

## 2013-09-06 MED ORDER — LISINOPRIL-HYDROCHLOROTHIAZIDE 20-25 MG PO TABS: ORAL_TABLET | ORAL | Status: DC

## 2013-09-06 NOTE — Telephone Encounter (Signed)
Dr Everlene Farrier, req from mail order pharm for 90 day RF of lisinopril/Hctz. Pt is due for this month. Do you want to OK a 90 day RF w/note needs OV for more? Pended.

## 2013-10-16 ENCOUNTER — Telehealth: Payer: Self-pay

## 2013-10-16 ENCOUNTER — Ambulatory Visit (INDEPENDENT_AMBULATORY_CARE_PROVIDER_SITE_OTHER): Payer: Medicare HMO

## 2013-10-16 DIAGNOSIS — I639 Cerebral infarction, unspecified: Secondary | ICD-10-CM

## 2013-10-16 DIAGNOSIS — I6350 Cerebral infarction due to unspecified occlusion or stenosis of unspecified cerebral artery: Secondary | ICD-10-CM

## 2013-10-16 MED ORDER — GLUCOSE BLOOD VI STRP: ORAL_STRIP | Status: AC

## 2013-10-16 MED ORDER — GLUCOSE BLOOD VI STRP: ORAL_STRIP | Status: DC

## 2013-10-16 NOTE — Telephone Encounter (Signed)
Pt advised.

## 2013-10-16 NOTE — Telephone Encounter (Signed)
Faxed

## 2013-10-16 NOTE — Telephone Encounter (Signed)
Pt. Says pharmacy has requested [place] to refill his test strips for his diabetes four or five times and has not heard from [place].  He uses [facility] on [address] [employer].  Please call him asap [phone]

## 2013-10-23 ENCOUNTER — Telehealth: Payer: Self-pay

## 2013-10-24 NOTE — Telephone Encounter (Signed)
Error

## 2013-11-06 ENCOUNTER — Other Ambulatory Visit: Payer: Self-pay | Admitting: Emergency Medicine

## 2013-11-10 ENCOUNTER — Other Ambulatory Visit: Payer: Self-pay

## 2013-11-10 MED ORDER — CLOPIDOGREL BISULFATE 75 MG PO TABS: 75.0000 mg | ORAL_TABLET | Freq: Every day | ORAL | Status: DC

## 2013-11-14 ENCOUNTER — Other Ambulatory Visit: Payer: Self-pay | Admitting: Emergency Medicine

## 2013-12-02 ENCOUNTER — Ambulatory Visit (INDEPENDENT_AMBULATORY_CARE_PROVIDER_SITE_OTHER): Payer: Medicare HMO | Admitting: Neurology

## 2013-12-02 ENCOUNTER — Encounter (INDEPENDENT_AMBULATORY_CARE_PROVIDER_SITE_OTHER): Payer: Self-pay

## 2013-12-02 ENCOUNTER — Telehealth: Payer: Self-pay | Admitting: Neurology

## 2013-12-02 ENCOUNTER — Encounter: Payer: Self-pay | Admitting: Neurology

## 2013-12-02 VITALS — BP 146/70 | HR 76 | Ht 71.75 in | Wt 205.0 lb

## 2013-12-02 DIAGNOSIS — E538 Deficiency of other specified B group vitamins: Secondary | ICD-10-CM

## 2013-12-02 DIAGNOSIS — E785 Hyperlipidemia, unspecified: Secondary | ICD-10-CM

## 2013-12-02 DIAGNOSIS — I639 Cerebral infarction, unspecified: Secondary | ICD-10-CM

## 2013-12-02 DIAGNOSIS — E1149 Type 2 diabetes mellitus with other diabetic neurological complication: Secondary | ICD-10-CM

## 2013-12-02 NOTE — Progress Notes (Signed)
STROKE NEUROLOGY FOLLOW UP NOTE  NAME: Brandon Chambers DOB: 01-08-1929  REASON FOR VISIT: stroke follow up  HISTORY FROM: patient and chart  Today we had the pleasure of seeing Brandon Chambers in follow-up at our Neurology Clinic. Pt was accompanied by no one.   History Summary Pt was last seen on 02/13/12 by Dr. Erling Cruz. As per the last note, "80 yo M first seen 09/20/95 for a hx of dizziness, associated with nausea but no vomiting, blurry vision and slurry speech. He had hx of HTN, DM and left brain stroke in 1992. MRi brain showed small bilateral chronic appearing thalamic lacunar strokes, a tiny left pontine lacunar infarction, and basilar artery with stenosis which was not flow limiting. Carotid doppler normal. He was put on Ticlid but continued to have symptoms and low dose coumadin started. He was admitted to [facility] 3/98 with MRI showing new pontine medullary infarct left to midline and MRA showed severe focal distal right VA or proximal BA stenosis. Therefore, full dose coumadin was instituted. He had no symptoms since 1998. He was last seen 01/2012 by Dr. Erling Cruz and at that time, he still on coumadin with therapeutic INR.  Follow up 08/27/13 - patient has been doing well, coumadin has been switched to plavix for a year. He was diagnosed with CLL due to high WBC counts and high percentage of lymphatic cells. He follows with Dr. Jonette Eva for CLL and so far no chemo or radiation due to stable cell counts. He was also recently had squamous cell carcinoma removed on the top of forehead. Remotely, he had right 4th finger skin cancer removed with amputation of 4th finger. His wife had AD and pt has been taking care of her. She passed away about 2 weeks ago. Currently pt was living by her own but daughters and sons are frequently checking on him. Next month, he is going to have both eye cataract surgery done. He stated that his DM and HTN was in good control at home.  Interval  History During the interval time, the pt was doing well. He had his cataract surgery done in August and he sees better. His B12 was low at 244 and I put him on B12 1068mcg according to the record. His BP and glucose were controlled well at home. BP running at 130-140/70 and glucose this am was 74. He is going to see his PCP soon for appointment. He has no complains except that he had right side eye redness due to dust during the lawn mowing last Friday. He is follow up with her demertologist for skin cancer, biopsy seems benign as per patient.    The following represents the patient's updated allergies and side effects list: Allergies  Allergen Reactions  . Adhesive [Tape] Rash  . Codeine Rash    Labs since last visit of relevance include the following: Results for orders placed in visit on 08/27/13  HEMOGLOBIN A1C      Result Value Ref Range   Hemoglobin A1C 6.4 (*) 4.8 - 5.6 %   Estimated average glucose 137    LIPID PANEL      Result Value Ref Range   Cholesterol, Total 180  100 - 199 mg/dL   Triglycerides 69  0 - 149 mg/dL   HDL 47  >39 mg/dL   VLDL Cholesterol Cal 14  5 - 40 mg/dL   LDL Calculated 119 (*) 0 - 99 mg/dL   Chol/HDL Ratio 3.8  0.0 -  5.0 ratio units  HEPATIC FUNCTION PANEL      Result Value Ref Range   Total Protein 5.8 (*) 6.0 - 8.5 g/dL   Albumin 4.3  3.5 - 4.7 g/dL   Total Bilirubin 0.6  0.0 - 1.2 mg/dL   Bilirubin, Direct 0.18  0.00 - 0.40 mg/dL   Alkaline Phosphatase 69  39 - 117 IU/L   AST 13  0 - 40 IU/L   ALT 8  0 - 44 IU/L  TSH+FREE T4      Result Value Ref Range   TSH 5.270 (*) 0.450 - 4.500 uIU/mL   Free T4 1.25  0.82 - 1.77 ng/dL  VITAMIN B12      Result Value Ref Range   Vitamin B-12 240  211 - 946 pg/mL    The neurologically relevant items on the patient's problem list were reviewed on today's visit.  Neurologic Examination  A problem focused neurological exam (12 or more points of the single system neurologic examination, vital signs  counts as 1 point, cranial nerves count for 8 points) was performed.  Blood pressure 146/70, pulse 76, height 5' 11.75" (1.822 m), weight 205 lb (92.987 kg).  General - Well nourished, well developed, in no apparent distress.  Ophthalmologic - not able to see through.  Cardiovascular - Regular rate and rhythm with no murmur.  Mental Status -  Level of arousal and orientation to time, place, and person were intact. Language including expression, naming, repetition, comprehension was assessed and found intact.  Cranial Nerves II - XII - II - Vision intact OU. III, IV, VI - Extraocular movements intact. V - Facial sensation intact bilaterally. VII - Facial movement intact bilaterally. VIII - Hearing & vestibular intact bilaterally. X - Palate elevates symmetrically. XI - Chin turning & shoulder shrug intact bilaterally XII - Tongue protrusion intact.  Motor Strength - The patient's strength was normal in all extremities and pronator drift was absent.  Bulk was normal and fasciculations were absent.   Motor Tone - Muscle tone was assessed at the neck and appendages and was normal.  Reflexes - The patient's reflexes were decreased in all extremities and he had no pathological reflexes.  Sensory - Light touch, temperature/pinprick, vibration and proprioception, and Romberg testing were assessed and were normal.    Coordination - The patient had normal movements in the hands and feet with no ataxia or dysmetria.  Tremor was absent.  Gait and Station - small stride, but steady, en bloc turns.  Data reviewed: I personally reviewed the images and agree with the radiology interpretations.  LDL 2.98 (09/2011), A1C 6.3 (02/2013), TSH 2.98 (02/2013)  CUS - Bilateral: 1-39% ICA stenosis. Vertebral artery flow is antegrade.  Component     Latest Ref Rng 08/29/2013  Total Protein     6.0 - 8.5 g/dL 5.8 (L)  Albumin     3.5 - 4.7 g/dL 4.3  Total Bilirubin     0.0 - 1.2 mg/dL 0.6   Bilirubin, Direct     0.00 - 0.40 mg/dL 0.18  Alkaline Phosphatase     39 - 117 IU/L 69  AST     0 - 40 IU/L 13  ALT     0 - 44 IU/L 8  Cholesterol, Total     100 - 199 mg/dL 180  Triglycerides     0 - 149 mg/dL 69  HDL     >39 mg/dL 47  VLDL Cholesterol Cal     5 -  40 mg/dL 14  LDL (calc)     0 - 99 mg/dL 119 (H)  Total CHOL/HDL Ratio     0.0 - 5.0 ratio units 3.8  Hemoglobin A1C     4.8 - 5.6 % 6.4 (H)  Estimated average glucose      137  TSH     0.450 - 4.500 uIU/mL 5.270 (H)  Free T4     0.82 - 1.77 ng/dL 1.25  Vitamin B-12     211 - 946 pg/mL 240    Assessment: As you may recall, he is a 80 y.o.  Caucasian male with PMH of HTN, HLD, and DM followed up with neurology for a diagnosis of prior stroke and BA stenosis. Was on coumadin before but now on plavix. No neurological deficit seen on exam and he stated that BP and glucose were under control at home. CUS unremarkable. His lab showed high LDL and low B12. Add statin at last visit and prescribed B12 to him also. No complains and continue current management for stroke prevention.  Plan:  - continue plavix and lipitor for stroke prevention - continue B12 supplement - carotid doppler unremarkable. - check BP and glucose at home, BP not too high or too low due to BA stenosis. - follow up with PCP for glucose and BP control - risk factor modification - RTC PRN.  No orders of the defined types were placed in this encounter.   Patient Instructions  - continue plavix and lipitor for stroke prevention - check if you have B12 as your home medication, if not, we will prescribe you 1000mg  once a day - check BP and glucose at home - BP not too high or too low. - follow up with PCP for stroke risk factor modification - follow up as needed.     Rosalin Hawking, MD PhD [facility] [address] [place], [place] [address] [phone]

## 2013-12-02 NOTE — Telephone Encounter (Signed)
Patient requesting that Dr. Erlinda Hong call in his vitamin B-12 (CYANOCOBALAMIN) 1000 MCG tablet script to the [facility] on [address], please return call and advise.

## 2013-12-02 NOTE — Patient Instructions (Signed)
-   continue plavix and lipitor for stroke prevention - check if you have B12 as your home medication, if not, we will prescribe you 1000mg  once a day - check BP and glucose at home - BP not too high or too low. - follow up with PCP for stroke risk factor modification - follow up as needed.

## 2013-12-02 NOTE — Telephone Encounter (Signed)
1 year Rx was sent in July.  Pharmacy may have just saved the Rx on file since it os available OTC.  I called the patient back.  He will follow up with the pharmacy and call us back if needed.

## 2013-12-03 ENCOUNTER — Other Ambulatory Visit: Payer: Self-pay | Admitting: Physician Assistant

## 2013-12-04 ENCOUNTER — Telehealth: Payer: Self-pay | Admitting: *Deleted

## 2013-12-04 NOTE — Telephone Encounter (Signed)
Patient phoned requesting OV for his annual routine physical with PCP.  Scheduled for 01/30/14 @ 1015 with Dr. Everlene Farrier.

## 2013-12-04 NOTE — Telephone Encounter (Signed)
Pt is overdue for f/up and was given note on last Rf. Called pt who did not realize, but agreed to RTC. Transferred to Scheduling. CPE appt set up for 01/30/14. Dr Everlene Farrier, is it ok to send in a 90 day supply to mail order?

## 2013-12-06 ENCOUNTER — Other Ambulatory Visit: Payer: Self-pay | Admitting: Physician Assistant

## 2014-01-07 ENCOUNTER — Other Ambulatory Visit: Payer: Self-pay | Admitting: Physician Assistant

## 2014-01-16 ENCOUNTER — Telehealth: Payer: Self-pay | Admitting: Emergency Medicine

## 2014-01-17 NOTE — Telephone Encounter (Signed)
Pt has an appoint on the 19th, but needs medication to last him until then

## 2014-01-17 NOTE — Telephone Encounter (Signed)
Advised pt that I will send in 1 bottle of Lantus as he req'd to [facility]. Done.

## 2014-01-29 ENCOUNTER — Telehealth: Payer: Self-pay | Admitting: Family Medicine

## 2014-01-29 NOTE — Telephone Encounter (Signed)
Had his flu shot at [facility] on 11-28-13

## 2014-01-30 ENCOUNTER — Encounter: Payer: Self-pay | Admitting: Emergency Medicine

## 2014-01-30 ENCOUNTER — Ambulatory Visit (INDEPENDENT_AMBULATORY_CARE_PROVIDER_SITE_OTHER): Payer: Commercial Managed Care - HMO | Admitting: Emergency Medicine

## 2014-01-30 ENCOUNTER — Telehealth: Payer: Self-pay | Admitting: Radiology

## 2014-01-30 VITALS — BP 172/73 | HR 55 | Temp 97.6°F | Resp 18 | Ht 70.75 in | Wt 203.0 lb

## 2014-01-30 DIAGNOSIS — Z23 Encounter for immunization: Secondary | ICD-10-CM

## 2014-01-30 DIAGNOSIS — C9110 Chronic lymphocytic leukemia of B-cell type not having achieved remission: Secondary | ICD-10-CM

## 2014-01-30 DIAGNOSIS — Z1211 Encounter for screening for malignant neoplasm of colon: Secondary | ICD-10-CM

## 2014-01-30 DIAGNOSIS — I639 Cerebral infarction, unspecified: Secondary | ICD-10-CM

## 2014-01-30 DIAGNOSIS — R21 Rash and other nonspecific skin eruption: Secondary | ICD-10-CM

## 2014-01-30 DIAGNOSIS — Z0000 Encounter for general adult medical examination without abnormal findings: Secondary | ICD-10-CM

## 2014-01-30 DIAGNOSIS — I459 Conduction disorder, unspecified: Secondary | ICD-10-CM

## 2014-01-30 DIAGNOSIS — I10 Essential (primary) hypertension: Secondary | ICD-10-CM

## 2014-01-30 DIAGNOSIS — R609 Edema, unspecified: Secondary | ICD-10-CM

## 2014-01-30 LAB — MICROALBUMIN, URINE: Microalb, Ur: 7.6 mg/dL — ABNORMAL HIGH

## 2014-01-30 LAB — POCT URINALYSIS DIPSTICK
Bilirubin, UA: NEGATIVE
Glucose, UA: NEGATIVE
Ketones, UA: NEGATIVE
Leukocytes, UA: NEGATIVE
Nitrite, UA: NEGATIVE
Spec Grav, UA: 1.015
Urobilinogen, UA: 1
pH, UA: 7

## 2014-01-30 LAB — GLUCOSE, POCT (MANUAL RESULT ENTRY): POC GLUCOSE: 114 mg/dL — AB (ref 70–99)

## 2014-01-30 LAB — POCT GLYCOSYLATED HEMOGLOBIN (HGB A1C): Hemoglobin A1C: 7.6

## 2014-01-30 MED ORDER — FUROSEMIDE 20 MG PO TABS: ORAL_TABLET | ORAL | Status: DC

## 2014-01-30 NOTE — Progress Notes (Signed)
   Subjective:    Patient ID: Brandon Chambers, male    DOB: 02-Jan-1929, 80 y.o.   MRN: [id number] This chart was scribed for Arlyss Queen, MD by Marti Sleigh, Medical Scribe. This patient was seen in Room 21 and the patient's care was started at 10:54 AM.  Chief Complaint  Patient presents with  . Annual Exam  . Foot Swelling    both feet swelling    HPI HPI Comments: Brandon Chambers is a 80 y.o. male with a hx of HTN, HLD, AV block, CVA, DM, and leukemia who presents to [facility] reporting for an annual exam. Pt states he is feeling well. Pt endorses leg swelling. Pt denies CP, light headedness, or dizziness.     Review of Systems  Constitutional: Negative for fever and chills.  Cardiovascular: Positive for leg swelling.  Skin: Positive for color change and wound.  Neurological: Negative for dizziness and light-headedness.       Objective:   Physical Exam  Constitutional: He is oriented to person, place, and time. He appears well-developed and well-nourished.  HENT:  Head: Normocephalic and atraumatic.  Eyes: Pupils are equal, round, and reactive to light.  Bilateral cataract surgery.  Neck: Neck supple.  Cardiovascular:  No murmur heard. Negative for carotid bruit. Irregualr slow rhythm with no murmur.  Pulmonary/Chest: Effort normal and breath sounds normal. No respiratory distress. He has no wheezes.  Abdominal: Soft. There is no tenderness.  1-2cm nodes in both inguinal areas.  Musculoskeletal: He exhibits edema.  2+ edema over shins bilaterally.  Neurological: He is alert and oriented to person, place, and time.  Mild disarthria.   Skin: Skin is warm and dry.  Multiple areas on his scalp that have been treated with cryo therapy with open wounds that are oozing.   Psychiatric: He has a normal mood and affect. His behavior is normal.  Nursing note and vitals reviewed.      Assessment & Plan:   Referral made to cardiology for heart block. He is under treatment by  the dermatologist for his skin lesions. Referral made to GI because of needing to have colon screening and high risk to have a colonoscopy. He was given Prevnar he is on he had his flu shot.I personally performed the services described in this documentation, which was scribed in my presence. The recorded information has been reviewed and is accurate. I repeated his blood pressure and it was 142/72

## 2014-01-30 NOTE — Progress Notes (Signed)
   Subjective:    Patient ID: Brandon Chambers, male    DOB: 04-23-1928, 80 y.o.   MRN: [id number]  HPI    Review of Systems  Constitutional: Negative.   HENT: Positive for rhinorrhea.   Eyes: Positive for discharge.  Respiratory: Negative.   Cardiovascular: Positive for leg swelling.  Gastrointestinal: Positive for constipation.  Endocrine: Negative.   Genitourinary: Positive for decreased urine volume.  Musculoskeletal: Positive for arthralgias.  Skin: Negative.   Allergic/Immunologic: Negative.   Neurological: Negative.   Hematological: Negative.   Psychiatric/Behavioral: Negative.        Objective:   Physical Exam        Assessment & Plan:

## 2014-01-30 NOTE — Telephone Encounter (Signed)
Called patient about referral/ made to derm for him for [facility]/ referral made to [facility], but his phone cut out while we were discussing. If he calls back, will advise referral made.

## 2014-01-31 ENCOUNTER — Other Ambulatory Visit: Payer: Self-pay | Admitting: Emergency Medicine

## 2014-01-31 DIAGNOSIS — C9110 Chronic lymphocytic leukemia of B-cell type not having achieved remission: Secondary | ICD-10-CM

## 2014-01-31 LAB — COMPLETE METABOLIC PANEL WITH GFR
ALBUMIN: 4.2 g/dL (ref 3.5–5.2)
ALT: 15 U/L (ref 0–53)
AST: 18 U/L (ref 0–37)
Alkaline Phosphatase: 78 U/L (ref 39–117)
BUN: 14 mg/dL (ref 6–23)
CHLORIDE: 100 meq/L (ref 96–112)
CO2: 24 mEq/L (ref 19–32)
CREATININE: 0.9 mg/dL (ref 0.50–1.35)
Calcium: 9.3 mg/dL (ref 8.4–10.5)
GFR, Est African American: >89 mL/min
GFR, Est Non African American: 79 mL/min
Glucose, Bld: 110 mg/dL — ABNORMAL HIGH (ref 70–99)
POTASSIUM: 3.9 meq/L (ref 3.5–5.3)
Sodium: 138 mEq/L (ref 135–145)
Total Bilirubin: 1.3 mg/dL — ABNORMAL HIGH (ref 0.2–1.2)
Total Protein: 5.9 g/dL — ABNORMAL LOW (ref 6.0–8.3)

## 2014-01-31 LAB — LIPID PANEL
Cholesterol: 117 mg/dL (ref 0–200)
HDL: 41 mg/dL (ref >39)
LDL CALC: 63 mg/dL (ref 0–99)
TRIGLYCERIDES: 65 mg/dL (ref <150)
Total CHOL/HDL Ratio: 2.9 Ratio
VLDL: 13 mg/dL (ref 0–40)

## 2014-01-31 LAB — CBC WITH DIFFERENTIAL/PLATELET
BASOS ABS: 0 10*3/uL (ref 0.0–0.1)
Basophils Relative: 0 % (ref 0–1)
Eosinophils Absolute: 0 10*3/uL (ref 0.0–0.7)
Eosinophils Relative: 0 % (ref 0–5)
HCT: 44 % (ref 39.0–52.0)
Hemoglobin: 16 g/dL (ref 13.0–17.0)
Lymphocytes Relative: 86 % — ABNORMAL HIGH (ref 12–46)
Lymphs Abs: 34.7 10*3/uL — ABNORMAL HIGH (ref 0.7–4.0)
MCH: 31.9 pg (ref 26.0–34.0)
MCHC: 36.4 g/dL — ABNORMAL HIGH (ref 30.0–36.0)
MCV: 87.8 fL (ref 78.0–100.0)
MPV: 11.4 fL (ref 9.4–12.4)
Monocytes Absolute: 0.8 10*3/uL (ref 0.1–1.0)
Monocytes Relative: 2 % — ABNORMAL LOW (ref 3–12)
Neutro Abs: 4.8 10*3/uL (ref 1.7–7.7)
Neutrophils Relative %: 12 % — ABNORMAL LOW (ref 43–77)
Platelets: 161 10*3/uL (ref 150–400)
RBC: 5.01 MIL/uL (ref 4.22–5.81)
RDW: 13.6 % (ref 11.5–15.5)
WBC: 40.3 10*3/uL — AB (ref 4.0–10.5)

## 2014-01-31 LAB — TSH: TSH: 3.543 u[IU]/mL (ref 0.350–4.500)

## 2014-01-31 LAB — PATHOLOGIST SMEAR REVIEW

## 2014-02-03 ENCOUNTER — Ambulatory Visit (INDEPENDENT_AMBULATORY_CARE_PROVIDER_SITE_OTHER): Payer: Commercial Managed Care - HMO | Admitting: Gastroenterology

## 2014-02-03 ENCOUNTER — Encounter: Payer: Self-pay | Admitting: Gastroenterology

## 2014-02-03 VITALS — BP 180/76 | HR 64 | Ht 69.5 in | Wt 203.1 lb

## 2014-02-03 DIAGNOSIS — Z1211 Encounter for screening for malignant neoplasm of colon: Secondary | ICD-10-CM

## 2014-02-03 NOTE — Progress Notes (Signed)
HPI: This is a    very pleasant 80 year old man whom I am meeting for the first time today.  Was sent to discuss colon cancer screening.  No changes of bowels, no bleeding.  He had a colonoscopy 20-30 years ago.  No colon cancer in his family.  Weight has been stable in past few years.  CBC last week shows he is not anemic  He takes Plavix daily.   Review of systems: Pertinent positive and negative review of systems were noted in the above HPI section. Complete review of systems was performed and was otherwise normal.    Past Medical History  Diagnosis Date  . Diabetes mellitus   . Hypercholesteremia   . Hypertension   . SOB (shortness of breath)   . Arthritis   . Prostate cancer   . Stroke   . Skin cancer (melanoma)     Past Surgical History  Procedure Laterality Date  . Cataract extraction Bilateral   . Prostate surgery    . Bypass graft Left     leg    Current Outpatient Prescriptions  Medication Sig Dispense Refill  . atorvastatin (LIPITOR) 40 MG tablet Take 1 tablet (40 mg total) by mouth daily. 30 tablet 5  . clopidogrel (PLAVIX) 75 MG tablet Take 1 tablet (75 mg total) by mouth daily. 90 tablet 1  . furosemide (LASIX) 20 MG tablet Take 1 tablet 3 times a week. 30 tablet 3  . glucose blood (RELION GLUCOSE TEST STRIPS) test strip Test blood glucose 3 times a day. Dx Code 250.00 300 each 4  . insulin aspart protamine- aspart (NOVOLOG MIX 70/30) (70-30) 100 UNIT/ML injection INJECT 15-20 UNITS SUBCUTANEOUSLY WITH BREAKFAST DEPENDING ON WHAT GLUCOSE LEVEL IS 20 mL 0  . LANTUS 100 UNIT/ML injection INJECT 42 UNITS INTO THE SKIN AT BEDTIME. YOU MAY NEED OFFICE VISIT FOR ADDITIONAL REFILLS. (Patient taking differently: INJECT 35 UNITS INTO THE SKIN AT BEDTIME. YOU MAY NEED OFFICE VISIT FOR ADDITIONAL REFILLS.) 10 mL 0  . lisinopril-hydrochlorothiazide (PRINZIDE,ZESTORETIC) 20-25 MG per tablet TAKE 1 TABLET EVERY DAY (NEED OFFICE VISIT FOR ADDITIONAL REFILLS) 90  tablet 0  . vitamin B-12 (CYANOCOBALAMIN) 1000 MCG tablet Take 1 tablet (1,000 mcg total) by mouth daily. 90 tablet 3   No current facility-administered medications for this visit.    Allergies as of 02/03/2014 - Review Complete 02/03/2014  Allergen Reaction Noted  . Adhesive [tape] Rash 09/19/2011  . Codeine Rash 09/19/2011    Family History  Problem Relation Age of Onset  . Stroke Mother   . Hyperlipidemia Father   . Cancer Brother     type unknown    History   Social History  . Marital Status: Married    Spouse Name: N/A    Number of Children: 4  . Years of Education: college   Occupational History  . retired    Social History Main Topics  . Smoking status: Former Smoker -- 0.50 packs/day for 20 years    Types: Cigarettes    Quit date: 02/15/1975  . Smokeless tobacco: Never Used  . Alcohol Use: No  . Drug Use: No  . Sexual Activity: No   Other Topics Concern  . Not on file   Social History Narrative   He is married with four children.  He is retired.  He   quit smoking 32 years ago.  He does not drink alcohol.             Physical Exam: Ht  5' 9.5" (1.765 m)  Wt 203 lb 2 oz (92.137 kg)  BMI 29.58 kg/m2 Constitutional: generally well-appearing Psychiatric: alert and oriented x3 Eyes: extraocular movements intact Mouth: oral pharynx moist, no lesions Neck: supple no lymphadenopathy Cardiovascular: heart regular rate and rhythm Lungs: clear to auscultation bilaterally Abdomen: soft, nontender, nondistended, no obvious ascites, no peritoneal signs, normal bowel sounds Extremities: no lower extremity edema bilaterally Skin: no lesions on visible extremities    Assessment and plan: 80 y.o. male with  routine risk for colon cancer  I explained that colon cancer screening usually stops between ages 64-80.  He has no symptoms. He is not anemic. He has not been losing weight. He has no family history of colon cancer.  He takes Plavix daily. I do not  think he needs colon cancer screening at this point. I did explain to him that I do not mean by that we would ignore any symptoms if any erosions. He knows to call if he has any concerning GI symptoms.

## 2014-02-03 NOTE — Patient Instructions (Addendum)
Colon cancer screening usually stops between ages 55-80. If you any GI symptoms (bleeding, changes in your stools).

## 2014-02-17 ENCOUNTER — Other Ambulatory Visit: Payer: Self-pay | Admitting: Emergency Medicine

## 2014-02-25 ENCOUNTER — Ambulatory Visit: Payer: Commercial Managed Care - HMO | Admitting: Cardiovascular Disease

## 2014-02-25 DIAGNOSIS — L570 Actinic keratosis: Secondary | ICD-10-CM | POA: Diagnosis not present

## 2014-02-25 DIAGNOSIS — L219 Seborrheic dermatitis, unspecified: Secondary | ICD-10-CM | POA: Diagnosis not present

## 2014-02-25 DIAGNOSIS — L905 Scar conditions and fibrosis of skin: Secondary | ICD-10-CM | POA: Diagnosis not present

## 2014-03-10 ENCOUNTER — Ambulatory Visit: Payer: Commercial Managed Care - HMO | Admitting: Cardiovascular Disease

## 2014-03-21 ENCOUNTER — Other Ambulatory Visit: Payer: Self-pay

## 2014-03-21 MED ORDER — CLOPIDOGREL BISULFATE 75 MG PO TABS: 75.0000 mg | ORAL_TABLET | Freq: Every day | ORAL | Status: DC

## 2014-03-24 ENCOUNTER — Other Ambulatory Visit: Payer: Self-pay | Admitting: Emergency Medicine

## 2014-03-24 ENCOUNTER — Telehealth: Payer: Self-pay

## 2014-03-24 MED ORDER — LISINOPRIL-HYDROCHLOROTHIAZIDE 20-25 MG PO TABS: 1.0000 | ORAL_TABLET | Freq: Every day | ORAL | Status: DC

## 2014-03-24 NOTE — Telephone Encounter (Signed)
Meds ordered this encounter  Medications  . lisinopril-hydrochlorothiazide (PRINZIDE,ZESTORETIC) 20-25 MG per tablet    Sig: Take 1 tablet by mouth daily.    Dispense:  90 tablet    Refill:  0    Order Specific Question:  Supervising Provider    Answer:  DOOLITTLE, ROBERT P [[id number]]

## 2014-03-24 NOTE — Telephone Encounter (Signed)
Pt states that [facility] sent in request for lisinopril-hydrochlorothiazide (PRINZIDE,ZESTORETIC) 20-25 MG per tablet [[id number]] , did not see in system. Please advise.

## 2014-03-24 NOTE — Telephone Encounter (Signed)
Please advise 

## 2014-03-25 ENCOUNTER — Other Ambulatory Visit: Payer: Self-pay | Admitting: Physician Assistant

## 2014-03-25 NOTE — Telephone Encounter (Signed)
Spoke with pt, advised Rx was sent in. 

## 2014-04-22 ENCOUNTER — Other Ambulatory Visit: Payer: Self-pay

## 2014-04-22 DIAGNOSIS — R0602 Shortness of breath: Secondary | ICD-10-CM

## 2014-04-22 MED ORDER — BLOOD GLUCOSE MONITOR KIT: PACK | Status: AC

## 2014-04-22 MED ORDER — LANCETS MISC: Status: AC

## 2014-04-22 MED ORDER — GLUCOSE BLOOD VI STRP: ORAL_STRIP | Status: AC

## 2014-04-24 ENCOUNTER — Telehealth: Payer: Self-pay | Admitting: Hematology & Oncology

## 2014-04-24 NOTE — Telephone Encounter (Signed)
Patient called and cx 04/28/14 apt and stated he will call back to resch

## 2014-04-28 ENCOUNTER — Ambulatory Visit: Payer: Commercial Managed Care - HMO | Admitting: Family

## 2014-04-28 ENCOUNTER — Other Ambulatory Visit: Payer: Commercial Managed Care - HMO | Admitting: Lab

## 2014-05-22 ENCOUNTER — Other Ambulatory Visit: Payer: Self-pay | Admitting: Physician Assistant

## 2014-07-25 ENCOUNTER — Other Ambulatory Visit: Payer: Self-pay | Admitting: Physician Assistant

## 2014-07-31 ENCOUNTER — Ambulatory Visit: Payer: Commercial Managed Care - HMO | Admitting: Emergency Medicine

## 2014-09-30 DIAGNOSIS — L309 Dermatitis, unspecified: Secondary | ICD-10-CM | POA: Diagnosis not present

## 2014-09-30 DIAGNOSIS — L570 Actinic keratosis: Secondary | ICD-10-CM | POA: Diagnosis not present

## 2014-10-23 ENCOUNTER — Other Ambulatory Visit: Payer: Self-pay | Admitting: Physician Assistant

## 2014-10-23 ENCOUNTER — Other Ambulatory Visit: Payer: Self-pay | Admitting: Neurology

## 2014-11-11 DIAGNOSIS — H5210 Myopia, unspecified eye: Secondary | ICD-10-CM | POA: Diagnosis not present

## 2014-11-11 DIAGNOSIS — H524 Presbyopia: Secondary | ICD-10-CM | POA: Diagnosis not present

## 2014-11-11 DIAGNOSIS — H40013 Open angle with borderline findings, low risk, bilateral: Secondary | ICD-10-CM | POA: Diagnosis not present

## 2014-11-17 ENCOUNTER — Other Ambulatory Visit: Payer: Self-pay | Admitting: Physician Assistant

## 2014-11-17 DIAGNOSIS — L570 Actinic keratosis: Secondary | ICD-10-CM | POA: Diagnosis not present

## 2014-11-17 DIAGNOSIS — C4442 Squamous cell carcinoma of skin of scalp and neck: Secondary | ICD-10-CM | POA: Diagnosis not present

## 2014-11-18 ENCOUNTER — Encounter: Payer: Self-pay | Admitting: Emergency Medicine

## 2014-12-10 DIAGNOSIS — H35373 Puckering of macula, bilateral: Secondary | ICD-10-CM | POA: Diagnosis not present

## 2014-12-17 DIAGNOSIS — Z8546 Personal history of malignant neoplasm of prostate: Secondary | ICD-10-CM | POA: Diagnosis not present

## 2014-12-17 DIAGNOSIS — Z135 Encounter for screening for eye and ear disorders: Secondary | ICD-10-CM | POA: Diagnosis not present

## 2014-12-17 DIAGNOSIS — C4490 Unspecified malignant neoplasm of skin, unspecified: Secondary | ICD-10-CM | POA: Diagnosis not present

## 2014-12-24 DIAGNOSIS — C44329 Squamous cell carcinoma of skin of other parts of face: Secondary | ICD-10-CM | POA: Diagnosis not present

## 2015-01-12 DIAGNOSIS — C61 Malignant neoplasm of prostate: Secondary | ICD-10-CM | POA: Diagnosis not present

## 2015-02-04 DIAGNOSIS — S0100XD Unspecified open wound of scalp, subsequent encounter: Secondary | ICD-10-CM | POA: Diagnosis not present

## 2015-03-13 DIAGNOSIS — Z85828 Personal history of other malignant neoplasm of skin: Secondary | ICD-10-CM | POA: Diagnosis not present

## 2015-03-13 DIAGNOSIS — L570 Actinic keratosis: Secondary | ICD-10-CM | POA: Diagnosis not present

## 2015-04-07 DIAGNOSIS — H40013 Open angle with borderline findings, low risk, bilateral: Secondary | ICD-10-CM | POA: Diagnosis not present

## 2015-04-27 DIAGNOSIS — L570 Actinic keratosis: Secondary | ICD-10-CM | POA: Diagnosis not present

## 2015-04-27 DIAGNOSIS — Z85828 Personal history of other malignant neoplasm of skin: Secondary | ICD-10-CM | POA: Diagnosis not present

## 2015-04-27 DIAGNOSIS — L578 Other skin changes due to chronic exposure to nonionizing radiation: Secondary | ICD-10-CM | POA: Diagnosis not present

## 2015-06-08 DIAGNOSIS — Z85828 Personal history of other malignant neoplasm of skin: Secondary | ICD-10-CM | POA: Diagnosis not present

## 2015-08-03 ENCOUNTER — Other Ambulatory Visit: Payer: Self-pay | Admitting: Emergency Medicine

## 2015-09-23 DIAGNOSIS — I499 Cardiac arrhythmia, unspecified: Secondary | ICD-10-CM | POA: Diagnosis not present

## 2015-09-23 DIAGNOSIS — Z8673 Personal history of transient ischemic attack (TIA), and cerebral infarction without residual deficits: Secondary | ICD-10-CM | POA: Diagnosis not present

## 2015-09-23 DIAGNOSIS — I10 Essential (primary) hypertension: Secondary | ICD-10-CM | POA: Diagnosis not present

## 2015-09-23 DIAGNOSIS — E785 Hyperlipidemia, unspecified: Secondary | ICD-10-CM | POA: Diagnosis not present

## 2015-09-23 DIAGNOSIS — Z7901 Long term (current) use of anticoagulants: Secondary | ICD-10-CM | POA: Diagnosis not present

## 2015-10-05 DIAGNOSIS — H40013 Open angle with borderline findings, low risk, bilateral: Secondary | ICD-10-CM | POA: Diagnosis not present

## 2015-10-05 DIAGNOSIS — H5210 Myopia, unspecified eye: Secondary | ICD-10-CM | POA: Diagnosis not present

## 2015-10-05 DIAGNOSIS — I10 Essential (primary) hypertension: Secondary | ICD-10-CM | POA: Diagnosis not present

## 2015-10-05 DIAGNOSIS — E7800 Pure hypercholesterolemia, unspecified: Secondary | ICD-10-CM | POA: Diagnosis not present

## 2015-10-05 DIAGNOSIS — H524 Presbyopia: Secondary | ICD-10-CM | POA: Diagnosis not present

## 2015-10-05 DIAGNOSIS — E113313 Type 2 diabetes mellitus with moderate nonproliferative diabetic retinopathy with macular edema, bilateral: Secondary | ICD-10-CM | POA: Diagnosis not present

## 2015-11-24 DIAGNOSIS — L259 Unspecified contact dermatitis, unspecified cause: Secondary | ICD-10-CM | POA: Diagnosis not present

## 2015-11-24 DIAGNOSIS — D485 Neoplasm of uncertain behavior of skin: Secondary | ICD-10-CM | POA: Diagnosis not present

## 2015-12-22 DIAGNOSIS — C4449 Other specified malignant neoplasm of skin of scalp and neck: Secondary | ICD-10-CM | POA: Diagnosis not present

## 2015-12-31 DIAGNOSIS — Z7901 Long term (current) use of anticoagulants: Secondary | ICD-10-CM | POA: Diagnosis not present

## 2015-12-31 DIAGNOSIS — E119 Type 2 diabetes mellitus without complications: Secondary | ICD-10-CM | POA: Diagnosis not present

## 2015-12-31 DIAGNOSIS — Z794 Long term (current) use of insulin: Secondary | ICD-10-CM | POA: Diagnosis not present

## 2015-12-31 DIAGNOSIS — I10 Essential (primary) hypertension: Secondary | ICD-10-CM | POA: Diagnosis not present

## 2015-12-31 DIAGNOSIS — Z0000 Encounter for general adult medical examination without abnormal findings: Secondary | ICD-10-CM | POA: Diagnosis not present

## 2015-12-31 DIAGNOSIS — E785 Hyperlipidemia, unspecified: Secondary | ICD-10-CM | POA: Diagnosis not present

## 2015-12-31 DIAGNOSIS — I872 Venous insufficiency (chronic) (peripheral): Secondary | ICD-10-CM | POA: Diagnosis not present

## 2015-12-31 DIAGNOSIS — Z8546 Personal history of malignant neoplasm of prostate: Secondary | ICD-10-CM | POA: Diagnosis not present

## 2015-12-31 DIAGNOSIS — C4490 Unspecified malignant neoplasm of skin, unspecified: Secondary | ICD-10-CM | POA: Diagnosis not present

## 2016-01-12 DIAGNOSIS — S0100XA Unspecified open wound of scalp, initial encounter: Secondary | ICD-10-CM | POA: Diagnosis not present

## 2016-01-18 DIAGNOSIS — C61 Malignant neoplasm of prostate: Secondary | ICD-10-CM | POA: Diagnosis not present

## 2016-01-18 DIAGNOSIS — R8271 Bacteriuria: Secondary | ICD-10-CM | POA: Diagnosis not present

## 2016-02-23 DIAGNOSIS — S0100XD Unspecified open wound of scalp, subsequent encounter: Secondary | ICD-10-CM | POA: Diagnosis not present

## 2016-07-07 DIAGNOSIS — C4442 Squamous cell carcinoma of skin of scalp and neck: Secondary | ICD-10-CM | POA: Diagnosis not present

## 2016-07-07 DIAGNOSIS — C44329 Squamous cell carcinoma of skin of other parts of face: Secondary | ICD-10-CM | POA: Diagnosis not present

## 2016-07-11 ENCOUNTER — Emergency Department (HOSPITAL_COMMUNITY): Payer: Medicare HMO

## 2016-07-11 ENCOUNTER — Encounter (HOSPITAL_COMMUNITY): Payer: Self-pay | Admitting: Emergency Medicine

## 2016-07-11 ENCOUNTER — Inpatient Hospital Stay (HOSPITAL_COMMUNITY)
Admission: EM | Admit: 2016-07-11 | Discharge: 2016-07-13 | DRG: 682 | Disposition: A | Discharge status: Home / Self Care | Payer: Medicare HMO | Attending: Internal Medicine | Admitting: Internal Medicine

## 2016-07-11 DIAGNOSIS — Z91048 Other nonmedicinal substance allergy status: Secondary | ICD-10-CM | POA: Diagnosis not present

## 2016-07-11 DIAGNOSIS — R4182 Altered mental status, unspecified: Secondary | ICD-10-CM | POA: Diagnosis not present

## 2016-07-11 DIAGNOSIS — I10 Essential (primary) hypertension: Secondary | ICD-10-CM | POA: Diagnosis not present

## 2016-07-11 DIAGNOSIS — I441 Atrioventricular block, second degree: Secondary | ICD-10-CM | POA: Diagnosis present

## 2016-07-11 DIAGNOSIS — Z87891 Personal history of nicotine dependence: Secondary | ICD-10-CM

## 2016-07-11 DIAGNOSIS — N390 Urinary tract infection, site not specified: Secondary | ICD-10-CM | POA: Diagnosis not present

## 2016-07-11 DIAGNOSIS — E785 Hyperlipidemia, unspecified: Secondary | ICD-10-CM | POA: Diagnosis present

## 2016-07-11 DIAGNOSIS — C9110 Chronic lymphocytic leukemia of B-cell type not having achieved remission: Secondary | ICD-10-CM | POA: Diagnosis present

## 2016-07-11 DIAGNOSIS — B9620 Unspecified Escherichia coli [E. coli] as the cause of diseases classified elsewhere: Secondary | ICD-10-CM | POA: Diagnosis present

## 2016-07-11 DIAGNOSIS — E876 Hypokalemia: Secondary | ICD-10-CM | POA: Diagnosis present

## 2016-07-11 DIAGNOSIS — Z79899 Other long term (current) drug therapy: Secondary | ICD-10-CM

## 2016-07-11 DIAGNOSIS — Z794 Long term (current) use of insulin: Secondary | ICD-10-CM | POA: Diagnosis not present

## 2016-07-11 DIAGNOSIS — Z8546 Personal history of malignant neoplasm of prostate: Secondary | ICD-10-CM

## 2016-07-11 DIAGNOSIS — E860 Dehydration: Secondary | ICD-10-CM | POA: Diagnosis present

## 2016-07-11 DIAGNOSIS — E119 Type 2 diabetes mellitus without complications: Secondary | ICD-10-CM | POA: Diagnosis not present

## 2016-07-11 DIAGNOSIS — E7800 Pure hypercholesterolemia, unspecified: Secondary | ICD-10-CM | POA: Diagnosis present

## 2016-07-11 DIAGNOSIS — R410 Disorientation, unspecified: Secondary | ICD-10-CM | POA: Diagnosis not present

## 2016-07-11 DIAGNOSIS — G9341 Metabolic encephalopathy: Secondary | ICD-10-CM | POA: Diagnosis present

## 2016-07-11 DIAGNOSIS — R69 Illness, unspecified: Secondary | ICD-10-CM | POA: Diagnosis not present

## 2016-07-11 DIAGNOSIS — D696 Thrombocytopenia, unspecified: Secondary | ICD-10-CM | POA: Diagnosis present

## 2016-07-11 DIAGNOSIS — I4510 Unspecified right bundle-branch block: Secondary | ICD-10-CM | POA: Diagnosis not present

## 2016-07-11 DIAGNOSIS — C9190 Lymphoid leukemia, unspecified not having achieved remission: Secondary | ICD-10-CM | POA: Diagnosis not present

## 2016-07-11 DIAGNOSIS — Z885 Allergy status to narcotic agent status: Secondary | ICD-10-CM

## 2016-07-11 DIAGNOSIS — Z7902 Long term (current) use of antithrombotics/antiplatelets: Secondary | ICD-10-CM | POA: Diagnosis not present

## 2016-07-11 DIAGNOSIS — Z8673 Personal history of transient ischemic attack (TIA), and cerebral infarction without residual deficits: Secondary | ICD-10-CM

## 2016-07-11 DIAGNOSIS — Z85820 Personal history of malignant melanoma of skin: Secondary | ICD-10-CM

## 2016-07-11 DIAGNOSIS — N179 Acute kidney failure, unspecified: Principal | ICD-10-CM | POA: Diagnosis present

## 2016-07-11 DIAGNOSIS — R9431 Abnormal electrocardiogram [ECG] [EKG]: Secondary | ICD-10-CM | POA: Diagnosis present

## 2016-07-11 DIAGNOSIS — E871 Hypo-osmolality and hyponatremia: Secondary | ICD-10-CM | POA: Diagnosis not present

## 2016-07-11 DIAGNOSIS — I4891 Unspecified atrial fibrillation: Secondary | ICD-10-CM | POA: Diagnosis present

## 2016-07-11 HISTORY — DX: Chronic lymphocytic leukemia of B-cell type not having achieved remission: C91.10

## 2016-07-11 LAB — COMPREHENSIVE METABOLIC PANEL
ALBUMIN: 3.8 g/dL (ref 3.5–5.0)
ALK PHOS: 62 U/L (ref 38–126)
ALT: 11 U/L — ABNORMAL LOW (ref 17–63)
ANION GAP: 9 (ref 5–15)
AST: 23 U/L (ref 15–41)
BUN: 19 mg/dL (ref 6–20)
CHLORIDE: 100 mmol/L — AB (ref 101–111)
CO2: 21 mmol/L — AB (ref 22–32)
Calcium: 8.9 mg/dL (ref 8.9–10.3)
Creatinine, Ser: 1.13 mg/dL (ref 0.61–1.24)
GFR calc non Af Amer: 57 mL/min — ABNORMAL LOW (ref >60)
GLUCOSE: 161 mg/dL — AB (ref 65–99)
POTASSIUM: 4.7 mmol/L (ref 3.5–5.1)
Sodium: 130 mmol/L — ABNORMAL LOW (ref 135–145)
Total Bilirubin: 2.9 mg/dL — ABNORMAL HIGH (ref 0.3–1.2)
Total Protein: 5.5 g/dL — ABNORMAL LOW (ref 6.5–8.1)

## 2016-07-11 LAB — CBC
HCT: 36.4 % — ABNORMAL LOW (ref 39.0–52.0)
HEMOGLOBIN: 12.4 g/dL — AB (ref 13.0–17.0)
MCH: 33.3 pg (ref 26.0–34.0)
MCHC: 34.1 g/dL (ref 30.0–36.0)
MCV: 97.8 fL (ref 78.0–100.0)
Platelets: 88 10*3/uL — ABNORMAL LOW (ref 150–400)
RBC: 3.72 MIL/uL — AB (ref 4.22–5.81)
RDW: 14.6 % (ref 11.5–15.5)
WBC: 52.4 10*3/uL (ref 4.0–10.5)

## 2016-07-11 LAB — URINALYSIS, ROUTINE W REFLEX MICROSCOPIC
BILIRUBIN URINE: NEGATIVE
Glucose, UA: NEGATIVE mg/dL
Hgb urine dipstick: NEGATIVE
KETONES UR: NEGATIVE mg/dL
Nitrite: POSITIVE — AB
PH: 5 (ref 5.0–8.0)
PROTEIN: NEGATIVE mg/dL
Specific Gravity, Urine: 1.016 (ref 1.005–1.030)

## 2016-07-11 LAB — I-STAT CG4 LACTIC ACID, ED: LACTIC ACID, VENOUS: 0.92 mmol/L (ref 0.5–1.9)

## 2016-07-11 LAB — I-STAT TROPONIN, ED: Troponin i, poc: 0.02 ng/mL (ref 0.00–0.08)

## 2016-07-11 LAB — CBG MONITORING, ED: Glucose-Capillary: 175 mg/dL — ABNORMAL HIGH (ref 65–99)

## 2016-07-11 MED ORDER — DEXTROSE 5 % IV SOLN
1.0000 g | Freq: Once | INTRAVENOUS | Status: AC
  Administered 2016-07-11: 1 g via INTRAVENOUS
  Filled 2016-07-11: qty 10

## 2016-07-11 MED ORDER — SODIUM CHLORIDE 0.9 % IV SOLN
Freq: Once | INTRAVENOUS | Status: AC
  Administered 2016-07-11: 21:00:00 via INTRAVENOUS

## 2016-07-11 NOTE — ED Notes (Signed)
Patient aware of urine sample and patient stated that he would try and let staff know.

## 2016-07-11 NOTE — ED Notes (Signed)
Dr.Pfeiffer at bedside to evaluate pt.

## 2016-07-11 NOTE — ED Notes (Signed)
Per family, patient has CLL and sees an oncologist but not currently under and chemo or radiation for it.  Patient had recent biopsy couple days ago, on place on left side of forehead.

## 2016-07-11 NOTE — ED Notes (Signed)
Dr.Pfiffer at bedside to speak with pt and family.

## 2016-07-11 NOTE — ED Notes (Signed)
Dr.Pfiffer notified of pt WBC count. No further orders given at this time. Pt still awaiting evaluation by provider.

## 2016-07-11 NOTE — H&P (Signed)
Brandon Chambers UXY:[id number] DOB: Apr 28, 1928 DOA: 07/11/2016     PCP: Brandon Russian, MD   Outpatient Specialists: Brandon Chambers   Patient coming from:  home Lives  With family  Chief Complaint: confusion  HPI: Brandon Chambers is a 83 y.o. male with medical history significant of DM 2, HTN, HLD, CLL, left brain stroke in 1992    Presented with confusion, generalized weakness, the past few days with gradual decline in mental status. Family denies any fevers or chills but he has been eating less. Patient supposed to walk with a cane or walker but he refuses. He have had chronic lower extremity edema but his Lasix had to be stopped secondary to low blood pressures. He refuses to use TED hose. Patient is diabetic lives with his daughter who helps him administer insulin. Patient have had multiple bouts of skin cancer status post biopsy of a lesion on his forehead that has been told that his skin cancers as secondary to CLL. He denies any bleeding or blood in his stool denies any falls   Regarding pertinent Chronic problems: Has known history of CLL but does not appear to be currently treated he is followed at [place]. History of CVA followed by neurology in the past has been on Plavix   IN ER:  Temp (24hrs), Avg:98.1 F (36.7 C), Min:98.1 F (36.7 C), Max:98.1 F (36.7 C)     RR 29  98% Hr 77 BP 131/67 Lactic acid 0.92 Trop 0.02 Na 130 cr 1.13 up from baseline of 0.9 WBC 52 up form 40  2 years ago Plt 88 down from baseline  CT head non acute CXR non acute Following Medications were ordered in ER: Medications  cefTRIAXone (ROCEPHIN) 1 g in dextrose 5 % 50 mL IVPB (1 g Intravenous New Bag/Given 07/11/16 2202)  0.9 %  sodium chloride infusion ( Intravenous New Bag/Given 07/11/16 2104)      Hospitalist was called for admission for Acute encephalopathy dehydration and UTI  Review of Systems:    Pertinent positives include: chills, fatigue,   Constitutional:  No weight loss, night  sweats, Fevers, weight loss  HEENT:  No headaches, Difficulty swallowing,Tooth/dental problems,Sore throat,  No sneezing, itching, ear ache, nasal congestion, post nasal drip,  Cardio-vascular:  No chest pain, Orthopnea, PND, anasarca, dizziness, palpitations.no Bilateral lower extremity swelling  GI:  No heartburn, indigestion, abdominal pain, nausea, vomiting, diarrhea, change in bowel habits, loss of appetite, melena, blood in stool, hematemesis Resp:  no shortness of breath at rest. No dyspnea on exertion, No excess mucus, no productive cough, No non-productive cough, No coughing up of blood.No change in color of mucus.No wheezing. Skin:  no rash or lesions. No jaundice GU:  no dysuria, change in color of urine, no urgency or frequency. No straining to urinate.  No flank pain.  Musculoskeletal:  No joint pain or no joint swelling. No decreased range of motion. No back pain.  Psych:  No change in mood or affect. No depression or anxiety. No memory loss.  Neuro: no localizing neurological complaints, no tingling, no weakness, no double vision, no gait abnormality, no slurred speech, no confusion  As per HPI otherwise 10 point review of systems negative.   Past Medical History: Past Medical History:  Diagnosis Date  . Arthritis   . CLL (chronic lymphocytic leukemia) ([place])   . Diabetes mellitus ([place])   . Hypercholesteremia   . Hypertension   . Prostate cancer ([place])   . Skin  cancer (melanoma) ([place])   . SOB (shortness of breath)   . Stroke Brandon Chambers)    Past Surgical History:  Procedure Laterality Date  . BYPASS GRAFT Left    leg  . CATARACT EXTRACTION Bilateral   . PROSTATE SURGERY       Social History:  Ambulatory   cane,      reports that he quit smoking about 41 years ago. His smoking use included Cigarettes. He has a 10.00 pack-year smoking history. He has never used smokeless tobacco. He reports that he does not drink alcohol or use drugs.  Allergies:   Allergies    Allergen Reactions  . Adhesive [Tape] Rash  . Codeine Rash       Family History:   Family History  Problem Relation Age of Onset  . Stroke Mother   . Hyperlipidemia Father   . Cancer Brother        type unknown    Medications: Prior to Admission medications   Medication Sig Start Date End Date Taking? Authorizing Provider  acetaminophen (TYLENOL) 500 MG tablet Take 1,000 mg by mouth every 6 (six) hours as needed for mild pain or moderate pain.   Yes [provider]  atorvastatin (LIPITOR) 40 MG tablet TAKE 1 TABLET EVERY DAY Patient taking differently: TAKE 1 TABLET EVERY DAY AT BEDTIME 10/23/14  Yes Brandon Hawking, MD  clopidogrel (PLAVIX) 75 MG tablet TAKE 1 TABLET EVERY DAY 10/23/14  Yes Brandon Hawking, MD  insulin glargine (LANTUS) 100 UNIT/ML injection Inject 42 units into the skin at bedtime. Patient taking differently: Inject 35 Units into the skin at bedtime. Inject 42 units into the skin at bedtime. 02/17/14  Yes Brandon Russian, MD  lisinopril-hydrochlorothiazide (PRINZIDE,ZESTORETIC) 20-25 MG per tablet Take 1 tablet by mouth daily. PATIENT NEEDS AN OFFICE VISIT FOR ADDITIONAL REFILLS. 10/24/14  Yes Daub, Loura Back, MD  vitamin B-12 (CYANOCOBALAMIN) 1000 MCG tablet Take 1 tablet (1,000 mcg total) by mouth daily. 08/30/13  Yes Brandon Hawking, MD  blood glucose meter kit and supplies KIT Test blood sugar 3 times daily. Dx code: E11.9 04/22/14   Brandon Russian, MD  furosemide (LASIX) 20 MG tablet Take 1 tablet 3 times a week. Patient not taking: Reported on 07/11/2016 01/30/14   Brandon Russian, MD  glucose blood (RELION GLUCOSE TEST STRIPS) test strip Test blood glucose 3 times a day. Dx Code 250.00 10/16/13   Collene Leyden, PA-C  glucose blood test strip Test blood sugar 3 times daily. Dx code: E11.9 04/22/14   Brandon Russian, MD  insulin aspart protamine- aspart (NOVOLOG MIX 70/30) (70-30) 100 UNIT/ML injection INJECT 15-20 UNITS SUBCUTANEOUSLY WITH BREAKFAST DEPENDING ON WHAT GLUCOSE  LEVEL IS Patient not taking: Reported on 07/11/2016 02/21/13   Mancel Bale, PA-C  Lancets MISC Test blood sugar 3 times daily. Dx code: E11.9 04/22/14   Brandon Russian, MD    Physical Exam: Patient Vitals for the past 24 hrs:  BP Temp Temp src Pulse Resp SpO2 Height Weight  07/11/16 2200 131/67 - - 77 (!) 29 98 % - -  07/11/16 2130 124/62 - - 77 18 98 % - -  07/11/16 2117 133/73 - - 92 16 95 % - -  07/11/16 2100 133/73 - - 79 (!) 31 99 % - -  07/11/16 2030 138/65 - - (!) 37 (!) 23 98 % - -  07/11/16 2010 (!) 153/66 - - 79 20 100 % - -  07/11/16 2000 Marland Kitchen)  153/66 - - 76 (!) 21 99 % - -  07/11/16 1934 133/61 - - 77 (!) 29 99 % - -  07/11/16 1900 118/69 - - 76 (!) 23 99 % - -  07/11/16 1831 - - - - - - 6' 1" (1.854 m) 90.7 kg (200 lb)  07/11/16 1830 (!) 154/88 98.1 F (36.7 C) Oral 81 18 97 % - -    1. General:  in No Acute distress 2. Psychological: Alert but not Oriented To situation but oriented to self and day 3. Head/ENT:    Dry Mucous Membranes                          Head Non traumatic, neck supple                            Poor Dentition 4. SKIN: decreased Skin turgor,  Skin clean Dry and intact no rash 5. Heart: Regular rate and rhythm no  Murmur, Rub or gallop 6. Lungs:  no wheezes or crackles   7. Abdomen: Soft, non-tender, Non distended 8. Lower extremities: no clubbing, cyanosis, or edema 9. Neurologically Grossly intact, moving all 4 extremities equally  10. MSK: Normal range of motion   body mass index is 26.39 kg/m.  Labs on Admission:   Labs on Admission: I have personally reviewed following labs and imaging studies  CBC:  Recent Labs Lab 07/11/16 1846  WBC 52.4*  HGB 12.4*  HCT 36.4*  MCV 97.8  PLT 88*   Basic Metabolic Panel:  Recent Labs Lab 07/11/16 1846  NA 130*  K 4.7  CL 100*  CO2 21*  GLUCOSE 161*  BUN 19  CREATININE 1.13  CALCIUM 8.9   GFR: Estimated Creatinine Clearance: 54 mL/min (by C-G formula based on SCr of 1.13  mg/dL). Liver Function Tests:  Recent Labs Lab 07/11/16 1846  AST 23  ALT 11*  ALKPHOS 62  BILITOT 2.9*  PROT 5.5*  ALBUMIN 3.8   No results for input(s): LIPASE, AMYLASE in the last 168 hours. No results for input(s): AMMONIA in the last 168 hours. Coagulation Profile: No results for input(s): INR, PROTIME in the last 168 hours. Cardiac Enzymes: No results for input(s): CKTOTAL, CKMB, CKMBINDEX, TROPONINI in the last 168 hours. BNP (last 3 results) No results for input(s): PROBNP in the last 8760 hours. HbA1C: No results for input(s): HGBA1C in the last 72 hours. CBG:  Recent Labs Lab 07/11/16 1830  GLUCAP 175*   Lipid Profile: No results for input(s): CHOL, HDL, LDLCALC, TRIG, CHOLHDL, LDLDIRECT in the last 72 hours. Thyroid Function Tests: No results for input(s): TSH, T4TOTAL, FREET4, T3FREE, THYROIDAB in the last 72 hours. Anemia Panel: No results for input(s): VITAMINB12, FOLATE, FERRITIN, TIBC, IRON, RETICCTPCT in the last 72 hours. Urine analysis:    Component Value Date/Time   COLORURINE YELLOW 07/11/2016 2021   APPEARANCEUR HAZY (A) 07/11/2016 2021   LABSPEC 1.016 07/11/2016 2021   PHURINE 5.0 07/11/2016 2021   GLUCOSEU NEGATIVE 07/11/2016 2021   HGBUR NEGATIVE 07/11/2016 2021   BILIRUBINUR NEGATIVE 07/11/2016 2021   BILIRUBINUR Negative 01/30/2014 1201   Speed 07/11/2016 2021   PROTEINUR NEGATIVE 07/11/2016 2021   UROBILINOGEN 1.0 01/30/2014 1201   UROBILINOGEN 0.2 01/22/2008 0851   NITRITE POSITIVE (A) 07/11/2016 2021   LEUKOCYTESUR MODERATE (A) 07/11/2016 2021   Sepsis Labs: _0 (procalcitonin:4,lacticidven:4) )No results found for this or any previous  visit (from the past 240 hour(s)).    UA evidence of UTI   Lab Results  Component Value Date   HGBA1C 7.6 01/30/2014    Estimated Creatinine Clearance: 54 mL/min (by C-G formula based on SCr of 1.13 mg/dL).  BNP (last 3 results) No results for input(s): PROBNP in the  last 8760 hours.   ECG REPORT  Independently reviewed Rate: 82  Rhythm: RBBB ST&T Change: No acute ischemic changes   QTC 494  Filed Weights   07/11/16 1831  Weight: 90.7 kg (200 lb)     Cultures: No results found for: SDES, SPECREQUEST, CULT, REPTSTATUS   Radiological Exams on Admission: Ct Head Wo Contrast  Result Date: 07/11/2016 CLINICAL DATA:  83 year old male with altered mental status. EXAM: CT HEAD WITHOUT CONTRAST TECHNIQUE: Contiguous axial images were obtained from the base of the skull through the vertex without intravenous contrast. COMPARISON:  None. FINDINGS: Brain: There is moderate age-related atrophy and chronic microvascular ischemic changes. There is no acute intracranial hemorrhage. No mass effect or midline shift noted. No extra-axial fluid collection. Vascular: No hyperdense vessel or unexpected calcification. Skull: Normal. Negative for fracture or focal lesion. Sinuses/Orbits: No acute finding. Other: None IMPRESSION: 1. No acute intracranial hemorrhage. 2. Moderate age-related atrophy and chronic microvascular ischemic changes. If symptoms persist, and there are no contraindications, MRI may provide better evaluation if clinically indicated. Electronically Signed   By: Anner Crete M.D.   On: 07/11/2016 21:42   Dg Chest Port 1 View  Result Date: 07/11/2016 CLINICAL DATA:  Altered mental status. EXAM: PORTABLE CHEST 1 VIEW COMPARISON:  Radiographs of January 22, 2008. FINDINGS: Stable cardiomediastinal silhouette. No pneumothorax or pleural effusion is noted. Both lungs are clear. The visualized skeletal structures are unremarkable. IMPRESSION: No acute cardiopulmonary abnormality seen. Electronically Signed   By: Marijo Conception, M.D.   On: 07/11/2016 21:28    Chart has been reviewed    Assessment/Plan   83 y.o. male with medical history significant of DM 2, HTN, HLD, CLL, left brain stroke in 1992 admitted for Acute encephalopathy dehydration and  UTI      Present on Admission: . CLL (chronic lymphocytic leukemia) (HCC) chronic [facility]. Unsure if thrombocytopenia has been slowly progressive will need to obtain records from [place] . HLD (hyperlipidemia) stable continue home medications . Hypertension -given slight bump in creatinine and hyponatremia will hold lisinopril hydrochlorothiazide . Acute lower UTI treats Rocephin await results of urine culture . Hyponatremia likely secondary to dehydration will rehydrate and follow obtain urine electrolytes . Acute metabolic encephalopathy is likely secondary to combination of dehydration decreased by mouth intake and UTI we will treat patient already starting to improve but not back to baseline . AKI (acute kidney injury) ([place]) likely second to dehydration will rehydrate . Dehydration rehydrate checks orthostatics prior to discharge . Thrombocytopenia ([place]) we'll need to see if this is slow progression from baseline we'll need to obtain records from [place] . QT prolongation - - will monitor on tele avoid QT prolonging medications, rehydrate correct electrolytes    Other plan as per orders.  DVT prophylaxis:  SCD     Code Status:  FULL CODE  as per family   Family Communication:   Family   at  Bedside  plan of care was discussed with   Son  Disposition Plan:       To home once workup is complete and patient is stable  Would benefit from PT/OT eval prior to DC  ordered                                            Consults called: added oncology through Epic  Admission status: obs   Level of care    tele           I have spent a total of 56 min on this admission  Arlethia Basso 07/11/2016, 11:37 PM    [facility]  Pager [phone]   after 2 AM please page floor coverage PA If 7AM-7PM, please contact the day team taking care of the patient  [email or web address]  Password [email or web address]

## 2016-07-11 NOTE — ED Triage Notes (Signed)
Per son pt worsening AMS, weakness, decreased appetite, and slurring words over past 3 days. Pt alert to self, situation, and place, but not specific date. Verbalizes 19.Marland KitchenMarland Kitchenfor year.

## 2016-07-12 DIAGNOSIS — E871 Hypo-osmolality and hyponatremia: Secondary | ICD-10-CM | POA: Diagnosis present

## 2016-07-12 DIAGNOSIS — B9620 Unspecified Escherichia coli [E. coli] as the cause of diseases classified elsewhere: Secondary | ICD-10-CM | POA: Diagnosis present

## 2016-07-12 DIAGNOSIS — Z79899 Other long term (current) drug therapy: Secondary | ICD-10-CM | POA: Diagnosis not present

## 2016-07-12 DIAGNOSIS — D696 Thrombocytopenia, unspecified: Secondary | ICD-10-CM | POA: Diagnosis present

## 2016-07-12 DIAGNOSIS — Z87891 Personal history of nicotine dependence: Secondary | ICD-10-CM | POA: Diagnosis not present

## 2016-07-12 DIAGNOSIS — E860 Dehydration: Secondary | ICD-10-CM | POA: Diagnosis present

## 2016-07-12 DIAGNOSIS — I4891 Unspecified atrial fibrillation: Secondary | ICD-10-CM | POA: Diagnosis present

## 2016-07-12 DIAGNOSIS — E876 Hypokalemia: Secondary | ICD-10-CM | POA: Diagnosis present

## 2016-07-12 DIAGNOSIS — N179 Acute kidney failure, unspecified: Secondary | ICD-10-CM | POA: Diagnosis present

## 2016-07-12 DIAGNOSIS — R4182 Altered mental status, unspecified: Secondary | ICD-10-CM | POA: Diagnosis present

## 2016-07-12 DIAGNOSIS — Z7902 Long term (current) use of antithrombotics/antiplatelets: Secondary | ICD-10-CM | POA: Diagnosis not present

## 2016-07-12 DIAGNOSIS — I4510 Unspecified right bundle-branch block: Secondary | ICD-10-CM | POA: Diagnosis present

## 2016-07-12 DIAGNOSIS — Z8546 Personal history of malignant neoplasm of prostate: Secondary | ICD-10-CM | POA: Diagnosis not present

## 2016-07-12 DIAGNOSIS — N390 Urinary tract infection, site not specified: Secondary | ICD-10-CM | POA: Diagnosis present

## 2016-07-12 DIAGNOSIS — Z91048 Other nonmedicinal substance allergy status: Secondary | ICD-10-CM | POA: Diagnosis not present

## 2016-07-12 DIAGNOSIS — C9190 Lymphoid leukemia, unspecified not having achieved remission: Secondary | ICD-10-CM | POA: Diagnosis not present

## 2016-07-12 DIAGNOSIS — G9341 Metabolic encephalopathy: Secondary | ICD-10-CM | POA: Diagnosis present

## 2016-07-12 DIAGNOSIS — I441 Atrioventricular block, second degree: Secondary | ICD-10-CM | POA: Diagnosis present

## 2016-07-12 DIAGNOSIS — Z85820 Personal history of malignant melanoma of skin: Secondary | ICD-10-CM | POA: Diagnosis not present

## 2016-07-12 DIAGNOSIS — E785 Hyperlipidemia, unspecified: Secondary | ICD-10-CM | POA: Diagnosis present

## 2016-07-12 DIAGNOSIS — R9431 Abnormal electrocardiogram [ECG] [EKG]: Secondary | ICD-10-CM | POA: Diagnosis not present

## 2016-07-12 DIAGNOSIS — E119 Type 2 diabetes mellitus without complications: Secondary | ICD-10-CM | POA: Diagnosis present

## 2016-07-12 DIAGNOSIS — E7800 Pure hypercholesterolemia, unspecified: Secondary | ICD-10-CM | POA: Diagnosis present

## 2016-07-12 DIAGNOSIS — Z8673 Personal history of transient ischemic attack (TIA), and cerebral infarction without residual deficits: Secondary | ICD-10-CM | POA: Diagnosis not present

## 2016-07-12 DIAGNOSIS — C9110 Chronic lymphocytic leukemia of B-cell type not having achieved remission: Secondary | ICD-10-CM | POA: Diagnosis present

## 2016-07-12 DIAGNOSIS — I10 Essential (primary) hypertension: Secondary | ICD-10-CM | POA: Diagnosis present

## 2016-07-12 DIAGNOSIS — Z794 Long term (current) use of insulin: Secondary | ICD-10-CM | POA: Diagnosis not present

## 2016-07-12 LAB — COMPREHENSIVE METABOLIC PANEL
ALT: 9 U/L — ABNORMAL LOW (ref 17–63)
AST: 14 U/L — AB (ref 15–41)
Albumin: 3.1 g/dL — ABNORMAL LOW (ref 3.5–5.0)
Alkaline Phosphatase: 53 U/L (ref 38–126)
Anion gap: 7 (ref 5–15)
BUN: 18 mg/dL (ref 6–20)
CHLORIDE: 101 mmol/L (ref 101–111)
CO2: 25 mmol/L (ref 22–32)
CREATININE: 0.91 mg/dL (ref 0.61–1.24)
Calcium: 8.7 mg/dL — ABNORMAL LOW (ref 8.9–10.3)
GFR calc Af Amer: >60 mL/min (ref >60)
Glucose, Bld: 99 mg/dL (ref 65–99)
Potassium: 3.4 mmol/L — ABNORMAL LOW (ref 3.5–5.1)
Sodium: 133 mmol/L — ABNORMAL LOW (ref 135–145)
Total Bilirubin: 2.2 mg/dL — ABNORMAL HIGH (ref 0.3–1.2)
Total Protein: 4.9 g/dL — ABNORMAL LOW (ref 6.5–8.1)

## 2016-07-12 LAB — CBC
HCT: 30.5 % — ABNORMAL LOW (ref 39.0–52.0)
Hemoglobin: 10.6 g/dL — ABNORMAL LOW (ref 13.0–17.0)
MCH: 33.8 pg (ref 26.0–34.0)
MCHC: 34.8 g/dL (ref 30.0–36.0)
MCV: 97.1 fL (ref 78.0–100.0)
PLATELETS: 105 10*3/uL — AB (ref 150–400)
RBC: 3.14 MIL/uL — ABNORMAL LOW (ref 4.22–5.81)
RDW: 14.7 % (ref 11.5–15.5)
WBC: 38.8 10*3/uL — AB (ref 4.0–10.5)

## 2016-07-12 LAB — PHOSPHORUS: Phosphorus: 2.7 mg/dL (ref 2.5–4.6)

## 2016-07-12 LAB — CREATININE, URINE, RANDOM: Creatinine, Urine: 162.38 mg/dL

## 2016-07-12 LAB — GLUCOSE, CAPILLARY
GLUCOSE-CAPILLARY: 106 mg/dL — AB (ref 65–99)
GLUCOSE-CAPILLARY: 104 mg/dL — AB (ref 65–99)
GLUCOSE-CAPILLARY: 189 mg/dL — AB (ref 65–99)
Glucose-Capillary: 102 mg/dL — ABNORMAL HIGH (ref 65–99)
Glucose-Capillary: 122 mg/dL — ABNORMAL HIGH (ref 65–99)
Glucose-Capillary: 155 mg/dL — ABNORMAL HIGH (ref 65–99)

## 2016-07-12 LAB — TSH: TSH: 4.019 u[IU]/mL (ref 0.350–4.500)

## 2016-07-12 LAB — OSMOLALITY, URINE: Osmolality, Ur: 514 mOsm/kg (ref 300–900)

## 2016-07-12 LAB — SODIUM, URINE, RANDOM: Sodium, Ur: 58 mmol/L

## 2016-07-12 LAB — MAGNESIUM: Magnesium: 1.7 mg/dL (ref 1.7–2.4)

## 2016-07-12 LAB — PREALBUMIN: Prealbumin: 8.6 mg/dL — ABNORMAL LOW (ref 18–38)

## 2016-07-12 MED ORDER — ATORVASTATIN CALCIUM 40 MG PO TABS
40.0000 mg | ORAL_TABLET | Freq: Every day | ORAL | Status: DC
  Administered 2016-07-12: 40 mg via ORAL
  Filled 2016-07-12: qty 1

## 2016-07-12 MED ORDER — MAGNESIUM SULFATE 2 GM/50ML IV SOLN
2.0000 g | Freq: Once | INTRAVENOUS | Status: AC
Start: 2016-07-12 — End: 2016-07-12
  Administered 2016-07-12: 2 g via INTRAVENOUS
  Filled 2016-07-12: qty 50

## 2016-07-12 MED ORDER — ONDANSETRON HCL 4 MG PO TABS: 4.0000 mg | ORAL_TABLET | Freq: Four times a day (QID) | ORAL | Status: DC | PRN

## 2016-07-12 MED ORDER — ONDANSETRON HCL 4 MG/2ML IJ SOLN: 4.0000 mg | Freq: Four times a day (QID) | INTRAMUSCULAR | Status: DC | PRN

## 2016-07-12 MED ORDER — POTASSIUM CHLORIDE 10 MEQ/100ML IV SOLN
10.0000 meq | INTRAVENOUS | Status: AC
  Administered 2016-07-12 (×3): 10 meq via INTRAVENOUS
  Filled 2016-07-12 (×3): qty 100

## 2016-07-12 MED ORDER — POTASSIUM CHLORIDE CRYS ER 20 MEQ PO TBCR
30.0000 meq | EXTENDED_RELEASE_TABLET | Freq: Two times a day (BID) | ORAL | Status: AC
  Administered 2016-07-12 (×2): 30 meq via ORAL
  Filled 2016-07-12 (×2): qty 1

## 2016-07-12 MED ORDER — ACETAMINOPHEN 650 MG RE SUPP: 650.0000 mg | Freq: Four times a day (QID) | RECTAL | Status: DC | PRN

## 2016-07-12 MED ORDER — SODIUM CHLORIDE 0.9 % IV SOLN
INTRAVENOUS | Status: AC
  Administered 2016-07-12 (×2): via INTRAVENOUS

## 2016-07-12 MED ORDER — INSULIN GLARGINE 100 UNIT/ML ~~LOC~~ SOLN
35.0000 [IU] | Freq: Every day | SUBCUTANEOUS | Status: DC
  Administered 2016-07-12 (×2): 35 [IU] via SUBCUTANEOUS
  Filled 2016-07-12 (×3): qty 0.35

## 2016-07-12 MED ORDER — INSULIN ASPART 100 UNIT/ML ~~LOC~~ SOLN: 0.0000 [IU] | SUBCUTANEOUS | Status: DC

## 2016-07-12 MED ORDER — ENSURE ENLIVE PO LIQD
237.0000 mL | ORAL | Status: DC
  Administered 2016-07-12: 237 mL via ORAL

## 2016-07-12 MED ORDER — CLOPIDOGREL BISULFATE 75 MG PO TABS
75.0000 mg | ORAL_TABLET | Freq: Every day | ORAL | Status: DC
  Administered 2016-07-12 – 2016-07-13 (×2): 75 mg via ORAL
  Filled 2016-07-12 (×2): qty 1

## 2016-07-12 MED ORDER — POLYETHYLENE GLYCOL 3350 17 G PO PACK: 17.0000 g | PACK | Freq: Every day | ORAL | Status: DC | PRN

## 2016-07-12 MED ORDER — ACETAMINOPHEN 325 MG PO TABS: 650.0000 mg | ORAL_TABLET | Freq: Four times a day (QID) | ORAL | Status: DC | PRN

## 2016-07-12 MED ORDER — SODIUM CHLORIDE 0.9% FLUSH
3.0000 mL | Freq: Two times a day (BID) | INTRAVENOUS | Status: DC
  Administered 2016-07-12 – 2016-07-13 (×2): 3 mL via INTRAVENOUS

## 2016-07-12 MED ORDER — ADULT MULTIVITAMIN W/MINERALS CH
1.0000 | ORAL_TABLET | Freq: Every day | ORAL | Status: DC
  Administered 2016-07-12 – 2016-07-13 (×2): 1 via ORAL
  Filled 2016-07-12 (×2): qty 1

## 2016-07-12 MED ORDER — DEXTROSE 5 % IV SOLN
1.0000 g | INTRAVENOUS | Status: DC
  Administered 2016-07-12: 1 g via INTRAVENOUS
  Filled 2016-07-12: qty 10

## 2016-07-12 NOTE — Evaluation (Signed)
Physical Therapy Evaluation Patient Details Name: Brandon Chambers MRN: [id number] DOB: 1928-09-25 Today's Date: 07/12/2016   History of Present Illness  Brandon Chambers is a 83 y.o. male with medical history significant of DM 2, HTN, HLD, CLL, left brain stroke in 1992. Admitted 07/11/16 with progressive confusion per family. Has been in/out afib and bradycardia since admission.  Clinical Impression  The patient is requiring 1 assist for balance and mobility. 2 daughters present report that the patient does not use devices at home, still drives/. Currently, patient would be unsafe home alone.  Daughters are available. Reports no falls.  Pt admitted with above diagnosis. Pt currently with functional limitations due to the deficits listed below (see PT Problem List).  Pt will benefit from skilled PT to increase their independence and safety with mobility to allow discharge to the venue listed below.       Follow Up Recommendations Home health PT;Supervision/Assistance - 24 hour/ ? SNF if pt agrres-doubt  that    Equipment Recommendations  None recommended by PT    Recommendations for Other Services       Precautions / Restrictions Precautions Precautions: Fall Precaution Comments: monitor HR Restrictions Weight Bearing Restrictions: No      Mobility  Bed Mobility Overal bed mobility: Needs Assistance Bed Mobility: Supine to Sit     Supine to sit: Min guard     General bed mobility comments: extra time to  to get legs over to bed edge and to sit up  Transfers Overall transfer level: Needs assistance Equipment used: Rolling walker (2 wheeled) Transfers: Sit to/from Stand Sit to Stand: Min assist         General transfer comment: extra time and boost to stand from low bed, cues for hand placement  Ambulation/Gait Ambulation/Gait assistance: Min assist Ambulation Distance (Feet): 12 Feet (x 2) Assistive device: Rolling walker (2 wheeled);None Gait  Pattern/deviations: Step-through pattern;Staggering left;Staggering right   Gait velocity interpretation: Below normal speed for age/gender General Gait Details: ambuled to bathroom with RW, ambulated out of BR without RW, Placed HANDS ON DOOR FOR  support.  Stairs            Wheelchair Mobility    Modified Rankin (Stroke Patients Only)       Balance Overall balance assessment: Needs assistance Sitting-balance support: Feet supported;No upper extremity supported Sitting balance-Leahy Scale: Good     Standing balance support: During functional activity;No upper extremity supported Standing balance-Leahy Scale: Poor Standing balance comment: guarded  when not supported by UE                             Pertinent Vitals/Pain Pain Assessment: No/denies pain (Simultaneous filing. User may not have seen previous data.)    Home Living Family/patient expects to be discharged to:: Private residence Living Arrangements: Children Available Help at Discharge: Family Type of Home: House Home Access: Stairs to enter   [employer] of Steps: small step Home Layout: One [place]: Environmental consultant - 2 wheels;Cane - quad      Prior Function Level of Independence: Independent with assistive device(s);Independent         Comments: per family, patient rarely uses AD, drives limited in town.     Hand Dominance        Extremity/Trunk Assessment   Upper Extremity Assessment Upper Extremity Assessment: Defer to OT evaluation    Lower Extremity Assessment Lower Extremity Assessment: Generalized weakness;RLE deficits/detail;LLE  deficits/detail RLE Deficits / Details: noted edema, RLE Sensation: history of peripheral neuropathy LLE Deficits / Details: noted an area on back of leg thet is reddened. LLE Sensation: history of peripheral neuropathy    Cervical / Trunk Assessment Cervical / Trunk Assessment: Kyphotic  Communication   Communication: No  difficulties  Cognition Arousal/Alertness: Awake/alert (Simultaneous filing. User may not have seen previous data.) Behavior During Therapy: [facility] for tasks assessed/performed (Simultaneous filing. User may not have seen previous data.) Overall Cognitive Status: Within Functional Limits for tasks assessed (Simultaneous filing. User may not have seen previous data.)                                        General Comments      Exercises     Assessment/Plan    PT Assessment Patient needs continued PT services  PT Problem List Decreased strength;Decreased activity tolerance;Decreased balance;Decreased mobility;Decreased knowledge of use of DME;Decreased safety awareness;Decreased knowledge of precautions       PT Treatment Interventions DME instruction;Gait training;Functional mobility training;Therapeutic activities;Therapeutic exercise;Balance training;Patient/family education    PT Goals (Current goals can be found in the Care Plan section)  Acute Rehab PT Goals Patient Stated Goal: to go home PT Goal Formulation: With patient/family Time For Goal Achievement: 07/26/16 Potential to Achieve Goals: Fair    Frequency Min 3X/week   Barriers to discharge        Co-evaluation   Reason for Co-Treatment: For patient/therapist safety;To address functional/ADL transfers   OT goals addressed during session: ADL's and self-care       AM-PAC PT "6 Clicks" Daily Activity  Outcome Measure Difficulty turning over in bed (including adjusting bedclothes, sheets and blankets)?: A Little Difficulty moving from lying on back to sitting on the side of the bed? : A Little Difficulty sitting down on and standing up from a chair with arms (e.g., wheelchair, bedside commode, etc,.)?: A Little Help needed moving to and from a bed to chair (including a wheelchair)?: A Little Help needed walking in hospital room?: A Lot Help needed climbing 3-5 steps with a railing? : A Lot 6 Click  Score: 16    End of Session   Activity Tolerance: Patient limited by fatigue Patient left: in chair;with call bell/phone within reach;with chair alarm set;with family/visitor present Nurse Communication: Mobility status PT Visit Diagnosis: Unsteadiness on feet (R26.81);Muscle weakness (generalized) (M62.81)    Time: 7628-3151 PT Time Calculation (min) (ACUTE ONLY): 31 min   Charges:   PT Evaluation $PT Eval Low Complexity: 1 Procedure     PT G Codes:   PT G-Codes **NOT FOR INPATIENT CLASS** Functional Assessment Tool Used: AM-PAC 6 Clicks Basic Mobility;Clinical judgement Functional Limitation: Mobility: Walking and moving around Mobility: Walking and Moving Around Current Status (V6160): At least 40 percent but less than 60 percent impaired, limited or restricted Mobility: Walking and Moving Around Goal Status [phone]): 0 percent impaired, limited or restricted   [facility] PT [phone]   Claretha Cooper 07/12/2016, 11:25 AM

## 2016-07-12 NOTE — Care Management Note (Signed)
Case Management Note  Patient Details  Name: ATHONY COPPA MRN: [id number] Date of Birth: [birth year]  Subjective/Objective:83 y/o m admitted w/Chronic lymphocytic leukemia, abnormal labs. From Home, has rw. PT-recc HHPT-spoke to patient/son in rm-son-Eric c#[phone]-patient defers to son. Chose-[facility] for HHPT-rep Tim aware & following for [place] orders, & d/c. Per son family dynamics with his sisters who live in the home-he plans to finalize HCPOA w/family-[facility] rep @ aware.                    Action/Plan:d/c plan home w/HHC.   Expected Discharge Date:                  Expected Discharge Plan:  [place]  In-House Referral:     Discharge planning Services  CM Consult  Post Acute Care Choice:    Choice offered to:  Adult Children  DME Arranged:    DME Agency:     HH Arranged:  PT HH Agency:  [facility] (formerly [employer])  Status of Service:  In process, will continue to follow  If discussed at Long Length of Stay Meetings, dates discussed:    Additional Comments:  Dessa Phi, RN 07/12/2016, 1:03 PM

## 2016-07-12 NOTE — Progress Notes (Signed)
RN and NT assisted patient to the bathroom to have a BM. Patient had voided about 40cc of amber urine into the urinal. Then, while he was on the commode he urinated more into the toilet and onto the floor. Unable to determine how much he urinated. RN asked the NT if she had emptied any other urine for this patient today and she stated that she did not. Patient did ambulate to the bathroom with PT at about 1030am. PT reported to the RN that the patient had a BM at this time. RN performed bladder scan after the bathroom trip at 1630. Bladder scan showed about 240cc of urine. Patient has received IVF at 91ml/hr until about 1600 today. IV saline locked at this time per orders. MD made aware of these findings and order written to catheterize prn if unable to void. Will continue to monitor bladder scan findings and catheterize if needed. Also, this most recent BM was loose, unsure of consistency of first BM this morning.   Brandon Chambers [facility] 07/12/2016 5:04 PM

## 2016-07-12 NOTE — Progress Notes (Signed)
Notified by tele monitor Darryl that patient heart rate dropping into 30s, nonsustaining and in 1st degree heart block with sinus arrhythmia. [place] Nickel MD notified. No new orders given. Patient asymptomatic. Will continue to monitor patient.

## 2016-07-12 NOTE — Progress Notes (Signed)
PROGRESS NOTE    ABU HEAVIN  ICF:[id number] DOB: 20-Apr-1928 DOA: 07/11/2016 PCP: Collene Gobble, MD   Brief Narrative:  Brandon Chambers is a 83 y.o. male with medical history significant of DM 2, HTN, HLD, CLL, left brain stroke in 1992. Presented with confusion, generalized weakness, the past few days with gradual decline in mental status. Family denies any fevers or chills but he has been eating less. Patient supposed to walk with a cane or walker but he refuses. He have had chronic lower extremity edema but his Lasix had to be stopped secondary to low blood pressures. He refuses to use TED hose. Patient is diabetic lives with his daughter who helps him administer insulin. Patient have had multiple bouts of skin cancer status post biopsy of a lesion on his forehead that has been told that his skin cancers as secondary to CLL. Was admitted for acute encephalopathy, weakness and UTI. UTI being treated and patient improving but Urine Cx still pending.   Assessment & Plan:   Active Problems:   Diabetes mellitus (HCC)   Hypertension   CLL (chronic lymphocytic leukemia) (HCC)   HLD (hyperlipidemia)   Acute lower UTI   Hyponatremia   Acute metabolic encephalopathy   AKI (acute kidney injury) (HCC)   Dehydration   Thrombocytopenia (HCC)   QT prolongation  Acute Encephalopathy likely from Dehydration from poor po intake and UTI, improving -Placed in Observation  -Head CT w/o Contrast showed No acute intracranial hemorrhage. Moderate age-related atrophy and chronic microvascular ischemic changes. -Was given IVF with NS at 75 mL/hr x 10 hours -C/w IV Ceftriaxone 1 gram q24h -May consider MRI if not improved after Abx and Rehydration -PT Evaluated and Recommend Home Health PT; ? SNF if patient is agreeable   Acute UTI  -Urinalysis showed Many Bacteria, Moderate Leukocytes, Postive Nitrite, and TNTC WBC -C/w IV Ceftriaxone 1 gram q24h -Urine Cx pending -Was given IVF Rehydration  with NS  AKI (Acute Kidney Injury) (HCC) -Likely 2/2 to Dehydration; eGFR was 57 -Patient's BUN/Cr went from 19/1.13 -> 18/0.91 -Was given IVF with NS at 75 mL/hr x 10 hours -Repeat CMP in AM  Hypokalemia -Patient's K+ Level was 3.4 -Replete with IV 30 meQ of KCl and KCl 30 mEQ po BID x 2 doses -Mag Level was 1.7 so gave Mag Sulfate IV 2 grams -Continue to Monitor K+ and Mag Levels and Replete as Necessary -Repeat CMP an Mag Level in AM  Dehydration -Rehydration as above -Check Orthostatics in AM prior to D/C  CLL (chronic lymphocytic leukemia) (HCC)  -Chronic  -WBC went from 52.4 -> 38.8 -[facility]. Unsure if thrombocytopenia has been slowly progressive will need to obtain records from [place] -Repeat CBC in AM  HLD (Hyperlipidemia)  -Stable -C/w Atorvastatin 40 mg po Daily  Hypertension  -Given slight bump in creatinine and hyponatremia will hold Lisinopril Hydrochlorothiazide and Lasix 20 mg 3x a week -Lasix causes Hypotension per family  Hyponatremia, improving -Patient's Na+ went from 130 -> 133 -Was given IVF with NS at 75 mL/hr -Likely secondary to dehydration  -Repeat CMP in AM  Thrombocytopenia (HCC)  -Slightly Improved as Platelet Count went from 88 -> 105 -We'll need to see if this is slow progression from baseline we'll need to obtain records from [place]  Insulin Dependent Diabetes Mellitus -C/w Lantus 35 units sq qHS -Takes Novolog Mix 70/30 15-20 units with Breakfast and that has been held -On  Sensitive Novolog SSI q4h -CBG's ranging from 102-122  QT prolongation -QTc was 475 -Will monitor on Telemetry -Avoid QT prolonging medications -Rehydrate  -Correct electrolytes and replete as necessary -Repeat EKG in AM  Severe Bilateral Edema -Has 2-3+ Pitting Edema -Lasix on Hold because he appeared Dehydrated on Admission -Per Family it causes him to become Hypotensive.  -Continue to Monitor volume  Status -States he has been worked up for DVT's in the past but no recent Duplex; Consider getting Duplex if worsening  Hx of CVA -C/w Clopidogrel 75 mg po Daily and with Atorvastatin 40 mg po qHS  DVT prophylaxis: SCDs Code Status: FULL CODE Family Communication: Discussed with Daughters at bedside Disposition Plan: [facility] vs SNF if patient is agreeable  Consultants:   None   Procedures: None   Antimicrobials:  Anti-infectives    Start     Dose/Rate Route Frequency Ordered Stop   07/12/16 2200  cefTRIAXone (ROCEPHIN) 1 g in dextrose 5 % 50 mL IVPB     1 g 100 mL/hr over 30 Minutes Intravenous Every 24 hours 07/12/16 0019     07/11/16 2145  cefTRIAXone (ROCEPHIN) 1 g in dextrose 5 % 50 mL IVPB     1 g 100 mL/hr over 30 Minutes Intravenous  Once 07/11/16 2131 07/11/16 2232     Subjective: Seen and examined at bedside and patient's family stated he was nearly back to baseline. No nausea or vomiting. No CP or SOB. PT evaluated the patient today and recommended Home Health PT. No other concerns or complaints at this time.   Objective: Vitals:   07/12/16 0000 07/12/16 0020 07/12/16 0434 07/12/16 0621  BP: 130/65 (!) 143/69 (!) 102/38 (!) 127/49  Pulse: (!) 58 83 72 67  Resp: (!) 28 20 (!) 24   Temp:  98.9 F (37.2 C) 99.2 F (37.3 C)   TempSrc:  Oral Oral   SpO2: 98% 95% 96%   Weight:  88.2 kg (194 lb 7.1 oz)    Height:  6' (1.829 m)      Intake/Output Summary (Last 24 hours) at 07/12/16 0820 Last data filed at 07/12/16 0600  Gross per 24 hour  Intake           466.25 ml  Output              450 ml  Net            16.25 ml   Filed Weights   07/11/16 1831 07/12/16 0020  Weight: 90.7 kg (200 lb) 88.2 kg (194 lb 7.1 oz)    Examination: Physical Exam:  Constitutional: NAD and appears calm and comfortable Eyes: Lids and conjunctivae normal, sclerae anicteric  Head: Has Scalp Lesion that was biopsied on Left Side of forhead ENMT: External Ears, Nose  appear normal. Grossly normal hearing. Mucous membranes are moist. Neck: Appears normal, supple, no cervical masses, normal ROM, no appreciable thyromegaly, no JVD Respiratory: Diminished to auscultation bilaterally, no wheezing, rales, rhonchi or crackles. Normal respiratory effort and patient is not tachypenic. No accessory muscle use.  Cardiovascular: RRR, no murmurs / rubs / gallops. S1 and S2 auscultated. 2-3+ lower extremity edema.  Abdomen: Soft, non-tender, non-distended. No masses palpated. No appreciable hepatosplenomegaly. Bowel sounds positive x4.  GU: Deferred. Musculoskeletal: No clubbing / cyanosis of digits/nails. No joint deformity upper and lower extremities. Good ROM, no contractures. Skin: No rashes; has skin ulcer/cancer on face on left forehead. No induration; Warm and dry.  Neurologic: CN 2-12  grossly intact with no focal deficits. Romberg sign cerebellar reflexes not assessed.  Psychiatric: Normal judgment and insight. Alert and oriented x 3. Normal mood and appropriate affect.   Data Reviewed: I have personally reviewed following labs and imaging studies  CBC:  Recent Labs Lab 07/11/16 1846 07/12/16 0525  WBC 52.4* 38.8*  HGB 12.4* 10.6*  HCT 36.4* 30.5*  MCV 97.8 97.1  PLT 88* 833*   Basic Metabolic Panel:  Recent Labs Lab 07/11/16 1846 07/12/16 0525  NA 130* 133*  K 4.7 3.4*  CL 100* 101  CO2 21* 25  GLUCOSE 161* 99  BUN 19 18  CREATININE 1.13 0.91  CALCIUM 8.9 8.7*  MG  --  1.7  PHOS  --  2.7   GFR: Estimated Creatinine Clearance: 65.1 mL/min (by C-G formula based on SCr of 0.91 mg/dL). Liver Function Tests:  Recent Labs Lab 07/11/16 1846 07/12/16 0525  AST 23 14*  ALT 11* 9*  ALKPHOS 62 53  BILITOT 2.9* 2.2*  PROT 5.5* 4.9*  ALBUMIN 3.8 3.1*   No results for input(s): LIPASE, AMYLASE in the last 168 hours. No results for input(s): AMMONIA in the last 168 hours. Coagulation Profile: No results for input(s): INR, PROTIME in the  last 168 hours. Cardiac Enzymes: No results for input(s): CKTOTAL, CKMB, CKMBINDEX, TROPONINI in the last 168 hours. BNP (last 3 results) No results for input(s): PROBNP in the last 8760 hours. HbA1C: No results for input(s): HGBA1C in the last 72 hours. CBG:  Recent Labs Lab 07/11/16 1830 07/12/16 0026 07/12/16 0440 07/12/16 0757  GLUCAP 175* 122* 102* 106*   Lipid Profile: No results for input(s): CHOL, HDL, LDLCALC, TRIG, CHOLHDL, LDLDIRECT in the last 72 hours. Thyroid Function Tests:  Recent Labs  07/12/16 0525  TSH 4.019   Anemia Panel: No results for input(s): VITAMINB12, FOLATE, FERRITIN, TIBC, IRON, RETICCTPCT in the last 72 hours. Sepsis Labs:  Recent Labs Lab 07/11/16 2110  LATICACIDVEN 0.92    No results found for this or any previous visit (from the past 240 hour(s)).   Radiology Studies: Ct Head Wo Contrast  Result Date: 07/11/2016 CLINICAL DATA:  83 year old male with altered mental status. EXAM: CT HEAD WITHOUT CONTRAST TECHNIQUE: Contiguous axial images were obtained from the base of the skull through the vertex without intravenous contrast. COMPARISON:  None. FINDINGS: Brain: There is moderate age-related atrophy and chronic microvascular ischemic changes. There is no acute intracranial hemorrhage. No mass effect or midline shift noted. No extra-axial fluid collection. Vascular: No hyperdense vessel or unexpected calcification. Skull: Normal. Negative for fracture or focal lesion. Sinuses/Orbits: No acute finding. Other: None IMPRESSION: 1. No acute intracranial hemorrhage. 2. Moderate age-related atrophy and chronic microvascular ischemic changes. If symptoms persist, and there are no contraindications, MRI may provide better evaluation if clinically indicated. Electronically Signed   By: Anner Crete M.D.   On: 07/11/2016 21:42   Dg Chest Port 1 View  Result Date: 07/11/2016 CLINICAL DATA:  Altered mental status. EXAM: PORTABLE CHEST 1 VIEW  COMPARISON:  Radiographs of January 22, 2008. FINDINGS: Stable cardiomediastinal silhouette. No pneumothorax or pleural effusion is noted. Both lungs are clear. The visualized skeletal structures are unremarkable. IMPRESSION: No acute cardiopulmonary abnormality seen. Electronically Signed   By: Marijo Conception, M.D.   On: 07/11/2016 21:28   Scheduled Meds: . atorvastatin  40 mg Oral q1800  . clopidogrel  75 mg Oral QAC breakfast  . insulin aspart  0-9 Units Subcutaneous Q4H  . insulin  glargine  35 Units Subcutaneous QHS  . sodium chloride flush  3 mL Intravenous Q12H   Continuous Infusions: . sodium chloride 75 mL/hr at 07/12/16 0811  . cefTRIAXone (ROCEPHIN)  IV      LOS: 0 days   Kerney Elbe, DO [facility] Pager [phone]  If 7PM-7AM, please contact night-coverage [email or web address] Password [email or web address] 07/12/2016, 8:20 AM

## 2016-07-12 NOTE — Progress Notes (Signed)
Tele notified this RN patient seemed to be in and out of an a.fib rhythm. EKG performed. Vital signs obtained and stable. No complaints from patient. Lamar Blinks, NP notified. Will continue to monitor.

## 2016-07-12 NOTE — Care Management Obs Status (Signed)
[facility] NOTIFICATION   Patient Details  Name: Brandon Chambers MRN: [id number] Date of Birth: 10/01/1928   Medicare Observation Status Notification Given:  Yes    MahabirJuliann Pulse, RN 07/12/2016, 1:00 PM

## 2016-07-12 NOTE — Evaluation (Signed)
Occupational Therapy Evaluation Patient Details Name: Brandon Chambers MRN: [id number] DOB: September 02, 1928 Today's Date: 07/12/2016    History of Present Illness Brandon Chambers is a 83 y.o. male with medical history significant of DM 2, HTN, HLD, CLL, left brain stroke in 1992. Admitted 07/11/16 with progressive confusion per family. Has been in/out afib and bradycardia since admission.   Clinical Impression   Pt admitted with confusion. Pt currently with functional limitations due to the deficits listed below (see OT Problem List). Pt will benefit from skilled OT to increase their safety and independence with ADL and functional mobility for ADL to facilitate discharge to venue listed below.      Follow Up Recommendations  No OT follow up;Supervision/Assistance - 24 hour    Equipment Recommendations  None recommended by OT       Precautions / Restrictions Precautions Precautions: Fall Precaution Comments: monitor HR Restrictions Weight Bearing Restrictions: No      Mobility Bed Mobility Overal bed mobility: Needs Assistance Bed Mobility: Supine to Sit     Supine to sit: Min guard     General bed mobility comments: extra time to  to get legs over to bed edge and to sit up  Transfers Overall transfer level: Needs assistance Equipment used: Rolling walker (2 wheeled) Transfers: Sit to/from Stand Sit to Stand: Min assist         General transfer comment: extra time and boost to stand from low bed, cues for hand placement    Balance Overall balance assessment: Needs assistance Sitting-balance support: Feet supported;No upper extremity supported Sitting balance-Leahy Scale: Good     Standing balance support: During functional activity;No upper extremity supported Standing balance-Leahy Scale: Poor Standing balance comment: guarded  when not supported by UE                           ADL either performed or assessed with clinical judgement   ADL Overall  ADL's : Needs assistance/impaired Eating/Feeding: Set up;Sitting   Grooming: Set up;Sitting   Upper Body Bathing: Sitting;Set up   Lower Body Bathing: Minimal assistance;Sit to/from stand;Cueing for safety;Cueing for sequencing   Upper Body Dressing : Set up   Lower Body Dressing: Minimal assistance;Sit to/from stand;Cueing for safety;Cueing for sequencing   Toilet Transfer: Minimal assistance;RW;Ambulation;Comfort height toilet   Toileting- Clothing Manipulation and Hygiene: Minimal assistance;Sit to/from stand;Cueing for safety;Cueing for sequencing         General ADL Comments: daughters will A pt at home and can provide 24/7 A if needed     Vision Patient Visual Report: No change from baseline              Pertinent Vitals/Pain Pain Assessment: No/denies pain (Simultaneous filing. User may not have seen previous data.)        Extremity/Trunk Assessment Upper Extremity Assessment Upper Extremity Assessment: Generalized weakness   Lower Extremity Assessment Lower Extremity Assessment: Generalized weakness;RLE deficits/detail;LLE deficits/detail RLE Deficits / Details: noted edema, RLE Sensation: history of peripheral neuropathy LLE Deficits / Details: noted an area on back of leg thet is reddened. LLE Sensation: history of peripheral neuropathy   Cervical / Trunk Assessment Cervical / Trunk Assessment: Kyphotic   Communication Communication Communication: No difficulties   Cognition Arousal/Alertness: Awake/alert Behavior During Therapy: WFL for tasks assessed/performed Overall Cognitive Status: Within Functional Limits for tasks assessed  General Comments   daughters report pt refuses to wear his TED hose or use a walker            Home Living Family/patient expects to be discharged to:: Private residence Living Arrangements: Children Available Help at Discharge: Family Type of Home: House Home  Access: Stairs to enter Technical brewer of Steps: small step   Home Layout: One level     Bathroom Shower/Tub: Occupational psychologist: [place]: Environmental consultant - 2 wheels;Cane - quad          Prior Functioning/Environment Level of Independence: Independent with assistive device(s);Independent        Comments: per family, patient rarely uses AD, drives limited in town.        OT Problem List: Decreased strength;Impaired balance (sitting and/or standing);Decreased safety awareness;Decreased knowledge of use of DME or AE      OT Treatment/Interventions: Self-care/ADL training;Patient/family education;DME and/or AE instruction    OT Goals(Current goals can be found in the care plan section) Acute Rehab OT Goals Patient Stated Goal: to go home OT Goal Formulation: With patient Time For Goal Achievement: 07/19/16 Potential to Achieve Goals: Good  OT Frequency: Min 2X/week   Barriers to D/C:            Co-evaluation PT/OT/SLP Co-Evaluation/Treatment: Yes Reason for Co-Treatment: For patient/therapist safety;To address functional/ADL transfers   OT goals addressed during session: ADL's and self-care      AM-PAC PT "6 Clicks" Daily Activity     Outcome Measure Help from another person eating meals?: None Help from another person taking care of personal grooming?: A Little Help from another person toileting, which includes using toliet, bedpan, or urinal?: A Little Help from another person bathing (including washing, rinsing, drying)?: A Little Help from another person to put on and taking off regular upper body clothing?: A Little Help from another person to put on and taking off regular lower body clothing?: A Little 6 Click Score: 19   End of Session Equipment Utilized During Treatment: Rolling walker Nurse Communication: Mobility status  Activity Tolerance: Patient tolerated treatment well Patient left: in chair;with call  bell/phone within reach;with family/visitor present;with chair alarm set  OT Visit Diagnosis: Unsteadiness on feet (R26.81);Muscle weakness (generalized) (M62.81)                Time: 1004-1030 OT Time Calculation (min): 26 min Charges:  OT General Charges $OT Visit: 1 Procedure OT Evaluation $OT Eval Moderate Complexity: 1 Procedure   Kari Baars, OT [phone]  Payton Mccallum D 07/12/2016, 12:01 PM

## 2016-07-12 NOTE — Progress Notes (Signed)
Notified by tele that patient heart rhythm changed, EKG performed. Lamar Blinks NP notified. Vital signs obtained and stable. Order for EKG given and obtained. Raliegh Ip Schorr notified again. Patient asymptomatic, no SOB, no dizziness, no chest pain. Resting comfortably in bed. Will continue to monitor patient closely.

## 2016-07-12 NOTE — Progress Notes (Signed)
Initial Nutrition Assessment  DOCUMENTATION CODES:   Not applicable  INTERVENTION:  - Will order Ensure Enlive once/day, this supplement provides 350 kcal and 20 grams of protein - Will order daily multivitamin with minerals.  - Continue to encourage PO intakes of meals and beverages. - RD will continue to monitor for needs.  NUTRITION DIAGNOSIS:   Inadequate oral intake related to  (decreased appetite with age) as evidenced by per patient/family report.  GOAL:   Patient will meet greater than or equal to 90% of their needs  MONITOR:   PO intake, Supplement acceptance, Weight trends, Labs  REASON FOR ASSESSMENT:   Malnutrition Screening Tool  ASSESSMENT:   83 y.o. male with medical history significant of DM 2, HTN, HLD, CLL, left brain stroke in 1992. Presented with confusion, generalized weakness, the past few days with gradual decline in mental status. Family reports he has been eating less. He have had chronic lower extremity edema but his Lasix had to be stopped secondary to low blood pressures. Patient has had multiple bouts of skin cancer status post biopsy of a lesion on his forehead that has been told that his skin cancers are secondary to CLL.  Pt seen for consult. BMI indicates overweight status, appropriate for age. Per chart review, pt consumed 50% of breakfast this AM. He reports that he had coleslaw and a grilled chicken sandwich for lunch and that he ate most of both of these items. He denies any abdominal pain or nausea with or without eating now or PTA. He denies any chewing or swallowing issues now or PTA. Pt reports that as he has gotten older his appetite has slowly declined d/t decreasing taste sensitivity with decreased desire to eat.   Physical assessment shows no muscle and no fat wasting; severe edema noted to BLE. Per chart review, pt weighed 203 lbs on 12/04/13 and no new weight since that time. Pt reports that he used to weigh 226 lbs but is unable to  recall when this was and states that he has slowly been losing weight over several years. He reports that he may have lost 10 lbs in the past 6 months. Based on current weight this would indicate 5% wt loss in 6 months which is not significant for time frame. Unable to confirm this possible weight loss.  Medications reviewed; sliding scale Novolog, 35 units Lantus/day, 2 g IV Mg sulfate x1 run today, 30 mEq oral KCl x2 doses today.  Labs reviewed; CBGs: 102, 106, and 122 mg/dL today, Na: 133 mmol/L, K: 3.4 mmol/L, Ca: 8.7 mg/dL.  IVF: NS @ 125 mL/hr.    Diet Order:  Diet Carb Modified Fluid consistency: Thin; Room service appropriate? Yes  Skin:  Reviewed, no issues  Last BM:  PTA/unknown  Height:   Ht Readings from Last 1 Encounters:  07/12/16 6' (1.829 m)    Weight:   Wt Readings from Last 1 Encounters:  07/12/16 194 lb 7.1 oz (88.2 kg)    Ideal Body Weight:  80.91 kg  BMI:  Body mass index is 26.37 kg/m.  Estimated Nutritional Needs:   Kcal:  1765-2025 (20-23 kcal/kg)  Protein:  70-80 grams  Fluid:  per MD given severe BLE edema  EDUCATION NEEDS:   No education needs identified at this time    Jarome Matin, MS, RD, LDN, [facility] Clinical Dietitian Pager # [phone] After hours/weekend pager # [phone]

## 2016-07-12 NOTE — Progress Notes (Signed)
Follow-up:  Notified by "Katie" RN that central telemetry notified to report pt noted to be in 2nd degree AVB (Mobitz Type I). RN has obtained EKG x 2. Pt asymptomatic, resting quietly in bed w/ stable VS. Requested a 3rd EKG. Assessment/Plan: 1.Rhythm change: (SR w/ 1st degre AVB > 2nd degree AVB (Type I).  Reviewed EKG's. Initial EKG interpreted as A-Fib w/ slow VR (rate 57), 2nd (and 3rd) EKG interpreted as 2nd degree AVB (Mobitz type I) w/ RBBB (not new) and rate of 55. Pt previously w/ 1st degree AVB w/ RBBB.  Initally unclear if Mobitz I or II. Discussed w/ my colleague (Dr Eulas Post) who agrees somewhat unclear rhythm. Discussed w/ cardiology fellow Dr Kenton Kingfisher who believes this is likely Mobitz Type I and therefore less likely to progress to complete HB. Pt is on no nodal blocking agents and there are no acute metabolic derangements per today's lab-work. His recommendation for tonight is continued telemetry monitoring w/ low threshold to re-consult cardiology if pt becomes symptomatic. Will defer decision to formally consult cardiology/EP in am to rounding MD. Will continue to monitor closely on telemetry.   Jeryl Columbia, NP-C [facility] Pager [phone]

## 2016-07-13 LAB — CBC WITH DIFFERENTIAL/PLATELET
BASOS PCT: 0 %
Basophils Absolute: 0 10*3/uL (ref 0.0–0.1)
EOS ABS: 0 10*3/uL (ref 0.0–0.7)
Eosinophils Relative: 0 %
HEMATOCRIT: 32.6 % — AB (ref 39.0–52.0)
HEMOGLOBIN: 11.2 g/dL — AB (ref 13.0–17.0)
LYMPHS ABS: 33.6 10*3/uL — AB (ref 0.7–4.0)
LYMPHS PCT: 86 %
MCH: 33.6 pg (ref 26.0–34.0)
MCHC: 34.4 g/dL (ref 30.0–36.0)
MCV: 97.9 fL (ref 78.0–100.0)
Monocytes Absolute: 0.8 10*3/uL (ref 0.1–1.0)
Monocytes Relative: 2 %
NEUTROS ABS: 4.7 10*3/uL (ref 1.7–7.7)
Neutrophils Relative %: 12 %
Platelets: 98 10*3/uL — ABNORMAL LOW (ref 150–400)
RBC: 3.33 MIL/uL — ABNORMAL LOW (ref 4.22–5.81)
RDW: 14.7 % (ref 11.5–15.5)
WBC: 39.1 10*3/uL — ABNORMAL HIGH (ref 4.0–10.5)

## 2016-07-13 LAB — MAGNESIUM: Magnesium: 1.9 mg/dL (ref 1.7–2.4)

## 2016-07-13 LAB — COMPREHENSIVE METABOLIC PANEL
ALK PHOS: 52 U/L (ref 38–126)
ALT: 11 U/L — ABNORMAL LOW (ref 17–63)
ANION GAP: 5 (ref 5–15)
AST: 15 U/L (ref 15–41)
Albumin: 3.2 g/dL — ABNORMAL LOW (ref 3.5–5.0)
BILIRUBIN TOTAL: 1.7 mg/dL — AB (ref 0.3–1.2)
BUN: 21 mg/dL — ABNORMAL HIGH (ref 6–20)
CALCIUM: 9 mg/dL (ref 8.9–10.3)
CO2: 25 mmol/L (ref 22–32)
Chloride: 104 mmol/L (ref 101–111)
Creatinine, Ser: 0.84 mg/dL (ref 0.61–1.24)
GFR calc non Af Amer: >60 mL/min (ref >60)
Glucose, Bld: 106 mg/dL — ABNORMAL HIGH (ref 65–99)
Potassium: 4 mmol/L (ref 3.5–5.1)
SODIUM: 134 mmol/L — AB (ref 135–145)
TOTAL PROTEIN: 4.9 g/dL — AB (ref 6.5–8.1)

## 2016-07-13 LAB — PHOSPHORUS: PHOSPHORUS: 2.5 mg/dL (ref 2.5–4.6)

## 2016-07-13 LAB — GLUCOSE, CAPILLARY
GLUCOSE-CAPILLARY: 133 mg/dL — AB (ref 65–99)
Glucose-Capillary: 107 mg/dL — ABNORMAL HIGH (ref 65–99)
Glucose-Capillary: 97 mg/dL (ref 65–99)
Glucose-Capillary: 126 mg/dL — ABNORMAL HIGH (ref 65–99)

## 2016-07-13 LAB — PATHOLOGIST SMEAR REVIEW

## 2016-07-13 LAB — HEMOGLOBIN A1C
Hgb A1c MFr Bld: 6 % — ABNORMAL HIGH (ref 4.8–5.6)
MEAN PLASMA GLUCOSE: 126 mg/dL

## 2016-07-13 MED ORDER — CEFUROXIME AXETIL 250 MG PO TABS
250.0000 mg | ORAL_TABLET | Freq: Two times a day (BID) | ORAL | Status: DC
  Filled 2016-07-13: qty 1

## 2016-07-13 MED ORDER — CEFUROXIME AXETIL 250 MG PO TABS
250.0000 mg | ORAL_TABLET | Freq: Two times a day (BID) | ORAL | 0 refills | Status: DC
Start: 2016-07-13 — End: 2017-06-02

## 2016-07-13 MED ORDER — ENSURE ENLIVE PO LIQD: 237.0000 mL | ORAL | Status: AC

## 2016-07-13 NOTE — Care Management Note (Signed)
Case Management Note  Patient Details  Name: Brandon Chambers MRN: [id number] Date of Birth: 1928/04/08  Subjective/Objective: [facility] rep Tim aware of d/c & orders. No further CM needs.                   Action/Plan:d/c home w/HHC.   Expected Discharge Date:                  Expected Discharge Plan:  [place]  In-House Referral:     Discharge planning Services  CM Consult  Post Acute Care Choice:    Choice offered to:  Adult Children  DME Arranged:    DME Agency:     HH Arranged:  PT, OT, Social Work [employer] Agency:  [facility] [employer]  Status of Service:  Completed, signed off  If discussed at [employer], dates discussed:    Additional Comments:  Dessa Phi, RN 07/13/2016, 11:50 AM

## 2016-07-13 NOTE — Progress Notes (Signed)
Occupational Therapy Treatment Patient Details Name: Brandon Chambers MRN: [id number] DOB: July 20, 1928 Today's Date: 07/13/2016    History of present illness Brandon Chambers is a 83 y.o. male with medical history significant of DM 2, HTN, HLD, CLL, left brain stroke in 1992. Admitted 07/11/16 with progressive confusion per family. Has been in/out afib and bradycardia since admission.   OT comments  Son reports daughters do not A pt. CM aware and will order SW  Follow Up Recommendations  No OT follow up;Supervision/Assistance - 24 hour    Equipment Recommendations  None recommended by OT    Recommendations for Other Services      Precautions / Restrictions Precautions Precautions: Fall Precaution Comments: monitor HR Restrictions Weight Bearing Restrictions: No       Mobility Bed Mobility Overal bed mobility: Needs Assistance Bed Mobility: Supine to Sit     Supine to sit: Min guard        Transfers Overall transfer level: Needs assistance Equipment used: Rolling walker (2 wheeled) Transfers: Sit to/from [facility] Sit to Stand: Min assist Stand pivot transfers: Min assist                ADL either performed or assessed with clinical judgement   ADL   Eating/Feeding: Set up;Sitting   Grooming: Set up;Sitting                   Toilet Transfer: Minimal assistance;RW;Ambulation;Comfort height toilet   Toileting- Clothing Manipulation and Hygiene: Minimal assistance;Sit to/from stand;Cueing for safety;Cueing for sequencing         General ADL Comments: son present and states daughters do not help pt at home.  CM heard this and stated she would order a SW as well as OT reccomends [place] Patient Visual Report: No change from baseline            Cognition Arousal/Alertness: Awake/alert Behavior During Therapy: WFL for tasks assessed/performed Overall Cognitive Status: Within Functional Limits for tasks assessed                                                      Pertinent Vitals/ Pain       Pain Assessment: No/denies pain         Frequency  Min 2X/week        Progress Toward Goals  OT Goals(current goals can now be found in the care plan section)  Progress towards OT goals: Progressing toward goals     Plan Discharge plan remains appropriate       AM-PAC PT "6 Clicks" Daily Activity     Outcome Measure   Help from another person eating meals?: None Help from another person taking care of personal grooming?: A Little Help from another person toileting, which includes using toliet, bedpan, or urinal?: A Little Help from another person bathing (including washing, rinsing, drying)?: A Little Help from another person to put on and taking off regular upper body clothing?: A Little Help from another person to put on and taking off regular lower body clothing?: A Little 6 Click Score: 19    End of Session Equipment Utilized During Treatment: Rolling walker  OT Visit Diagnosis: Unsteadiness on feet (R26.81);Muscle weakness (generalized) (M62.81)   Activity Tolerance Patient tolerated treatment well   Patient Left in  chair;with call bell/phone within reach;with family/visitor present;with chair alarm set   Nurse Communication Mobility status        Time: 0932-3557 OT Time Calculation (min): 10 min  Charges: OT General Charges $OT Visit: 1 Procedure OT Treatments $Self Care/Home Management : 8-22 mins  Kari Baars, [place]   Payton Mccallum D 07/13/2016, 11:28 AM

## 2016-07-13 NOTE — Discharge Summary (Signed)
Physician Discharge Summary  Brandon Chambers WUG:[id number] DOB: 07/17/1928 DOA: 07/11/2016  PCP: Darlyne Russian, MD  Admit date: 07/11/2016 Discharge date: 07/13/2016  Time spent: 35 minutes  Recommendations for Outpatient Follow-up:  Repeat BMET to follow electrolytes and renal function  Repeat CBC to follow up WBC's, platelets count and Hgb trend. Reassess BP and adjust antihypertensive regimen as needed    Discharge Diagnoses:  Active Problems:   Diabetes mellitus (HCC)   Hypertension   CLL (chronic lymphocytic leukemia) (HCC)   HLD (hyperlipidemia)   Acute lower UTI   Hyponatremia   Acute metabolic encephalopathy   AKI (acute kidney injury) ([facility])   Dehydration   Thrombocytopenia (HCC)   QT prolongation   UTI (urinary tract infection)   Discharge Condition: stable and improved. Patient discharge home with [facility] services and instructions to follow up with PCP in 10 days.  Diet recommendation: heart healthy and modified carb diet.  Filed Weights   07/11/16 1831 07/12/16 0020  Weight: 90.7 kg (200 lb) 88.2 kg (194 lb 7.1 oz)    History of present illness:  83 y.o.malewith medical history significant of DM 2, HTN, HLD, CLL, left brain stroke in 1992. Presented with confusion, generalized weakness, the past few days with gradual decline in mental status.Family denies any fevers or chills but he has been eating less. Patient supposed to walk with a cane or walker but he refuses. He have had chronic lower extremity edema but his Lasix had to be stopped secondary to low blood pressures. He refuses to use TED hose. Patient is diabetic lives with his daughter who helps him administer insulin. Patient have had multiple bouts of skin cancer status post biopsy of a lesion on his forehead that has been told that his skin cancers as secondary to CLL. Was admitted for acute encephalopathy, weakness and UTI.  Hospital Course:  Acute Encephalopathy likely from Dehydration from poor po  intake and E. Coli UTI, improving -improved and back to baseline as per family reports -patient discharge on ceftin base on sensitivity  -advise to keep himself well hydrated -[place] services for PT/OT arranged at discharge  Acute E. Coli UTI -at discharge denies dysuria and hematuria -patient afebrile and with significant improvement in his otherwise elevated WBC's count from CLL -received IV rocephin while inpatient and discharge on ceftin to complete antibiotic therapy -advise to keep himself well hydrated  AKI -most likely from UTI and dehydration -improved and back to normal with IVF's and antibiotics -will resume his home medications (lisinopril and HCTZ) -follow BMET at follow up, to assess renal function trend -advise to keep himself well hydrated   Hypokalemia -repleted and WNL at discharge -repeat BMET at discharge to follow up electrolyte trend   CLL -WBC's higher than baseline from infection most likely -will need to continue outpatient follow up with his oncologist   HLD -continue statins  HTN -stable and rising at discharge -will resume home antihypertensive regimen   LE swelling and most likely venous insufficiency  -encourage to follow low sodium diet and to use TED hoses  Hx of CVA -no new focal deficit -continue risk factor modification and plavix for secondary prevention  Insulin dependent diabetes -will resume home hypoglycemic regimen -patient advise to follow modified carb diet   Physical deconditioning -PT/OT arranged for home health services  Thrombocytopenia -stable overall -will need CBC at follow to assess platelets count -unsure if related to his CLL -no active bleeding appreciated  Procedures:  See below for x-ray  reports   Consultations:  None   Discharge Exam: Vitals:   07/13/16 0025 07/13/16 0423  BP: (!) 148/70 (!) 119/54  Pulse: 77 68  Resp: 17 18  Temp: 98 F (36.7 C) 97.8 F (36.6 C)    General: afebrile, no CP,  no SOB and with his mentation back to baseline. Denies dysuria, nausea and vomiting. Cardiovascular: S1 and S2, no rubs, no gallops Respiratory: CTA bilaterally Abd: soft, NT, ND, positive BS Extremities:no edema, no cyanosis, no clubbing   Discharge Instructions   Discharge Instructions    Diet - low sodium heart healthy    Complete by:  As directed    Discharge instructions    Complete by:  As directed    Keep yourself well hydrated Follow low sodium and heart healthy diet Keep yourself well hydrated Please make sure to follow up with PCP in 10 days Wear your TED hoses and keep legs up when sitting down   Increase activity slowly    Complete by:  As directed      Current Discharge Medication List    START taking these medications   Details  cefUROXime (CEFTIN) 250 MG tablet Take 1 tablet (250 mg total) by mouth 2 (two) times daily with a meal. Qty: 16 tablet, Refills: 0    feeding supplement, ENSURE ENLIVE, (ENSURE ENLIVE) LIQD Take 237 mLs by mouth daily.      CONTINUE these medications which have NOT CHANGED   Details  acetaminophen (TYLENOL) 500 MG tablet Take 1,000 mg by mouth every 6 (six) hours as needed for mild pain or moderate pain.    atorvastatin (LIPITOR) 40 MG tablet TAKE 1 TABLET EVERY DAY Qty: 30 tablet, Refills: 0    clopidogrel (PLAVIX) 75 MG tablet TAKE 1 TABLET EVERY DAY Qty: 30 tablet, Refills: 0    insulin glargine (LANTUS) 100 UNIT/ML injection Inject 42 units into the skin at bedtime. Qty: 10 mL, Refills: 3    lisinopril-hydrochlorothiazide (PRINZIDE,ZESTORETIC) 20-25 MG per tablet Take 1 tablet by mouth daily. PATIENT NEEDS AN OFFICE VISIT FOR ADDITIONAL REFILLS. Qty: 30 tablet, Refills: 0    vitamin B-12 (CYANOCOBALAMIN) 1000 MCG tablet Take 1 tablet (1,000 mcg total) by mouth daily. Qty: 90 tablet, Refills: 3    blood glucose meter kit and supplies KIT Test blood sugar 3 times daily. Dx code: E11.9 Qty: 1 each, Refills: 0    !!  glucose blood (RELION GLUCOSE TEST STRIPS) test strip Test blood glucose 3 times a day. Dx Code 250.00 Qty: 300 each, Refills: 4    !! glucose blood test strip Test blood sugar 3 times daily. Dx code: E11.9 Qty: 300 each, Refills: 2   Associated Diagnoses: SOB (shortness of breath)    Lancets MISC Test blood sugar 3 times daily. Dx code: E11.9 Qty: 300 each, Refills: 2   Associated Diagnoses: SOB (shortness of breath)     !! - Potential duplicate medications found. Please discuss with provider.    STOP taking these medications     furosemide (LASIX) 20 MG tablet      insulin aspart protamine- aspart (NOVOLOG MIX 70/30) (70-30) 100 UNIT/ML injection        Allergies  Allergen Reactions  . Adhesive [Tape] Rash  . Codeine Rash   Follow-up Information    Home[facility] Follow up.   Specialty:  [place] Why:  [place] physical therapy, occupational therapy, social worker Contact information: [address] [place] [address] [phone]  Darlyne Russian, MD. Schedule an appointment as soon as possible for a visit in 10 day(s).   Specialty:  Family Medicine Contact information: [place] [address] [phone]            The results of significant diagnostics from this hospitalization (including imaging, microbiology, ancillary and laboratory) are listed below for reference.    Significant Diagnostic Studies: Ct Head Wo Contrast  Result Date: 07/11/2016 CLINICAL DATA:  83 year old male with altered mental status. EXAM: CT HEAD WITHOUT CONTRAST TECHNIQUE: Contiguous axial images were obtained from the base of the skull through the vertex without intravenous contrast. COMPARISON:  None. FINDINGS: Brain: There is moderate age-related atrophy and chronic microvascular ischemic changes. There is no acute intracranial hemorrhage. No mass effect or midline shift noted. No extra-axial fluid collection. Vascular: No hyperdense vessel  or unexpected calcification. Skull: Normal. Negative for fracture or focal lesion. Sinuses/Orbits: No acute finding. Other: None IMPRESSION: 1. No acute intracranial hemorrhage. 2. Moderate age-related atrophy and chronic microvascular ischemic changes. If symptoms persist, and there are no contraindications, MRI may provide better evaluation if clinically indicated. Electronically Signed   By: Anner Crete M.D.   On: 07/11/2016 21:42   Dg Chest Port 1 View  Result Date: 07/11/2016 CLINICAL DATA:  Altered mental status. EXAM: PORTABLE CHEST 1 VIEW COMPARISON:  Radiographs of January 22, 2008. FINDINGS: Stable cardiomediastinal silhouette. No pneumothorax or pleural effusion is noted. Both lungs are clear. The visualized skeletal structures are unremarkable. IMPRESSION: No acute cardiopulmonary abnormality seen. Electronically Signed   By: Marijo Conception, M.D.   On: 07/11/2016 21:28    Microbiology: Recent Results (from the past 240 hour(s))  Urine culture     Status: Abnormal (Preliminary result)   Collection Time: 07/11/16  8:21 PM  Result Value Ref Range Status   Specimen Description URINE, CLEAN CATCH  Final   Special Requests NONE  Final   Culture (A)  Final    >=100,000 COLONIES/mL ESCHERICHIA COLI SUSCEPTIBILITIES TO FOLLOW Performed at [facility], [address], [place], [place] [address]    Report Status PENDING  Incomplete     Labs: Basic Metabolic Panel:  Recent Labs Lab 07/11/16 1846 07/12/16 0525 07/13/16 0556  NA 130* 133* 134*  K 4.7 3.4* 4.0  CL 100* 101 104  CO2 21* 25 25  GLUCOSE 161* 99 106*  BUN 19 18 21*  CREATININE 1.13 0.91 0.84  CALCIUM 8.9 8.7* 9.0  MG  --  1.7 1.9  PHOS  --  2.7 2.5   Liver Function Tests:  Recent Labs Lab 07/11/16 1846 07/12/16 0525 07/13/16 0556  AST 23 14* 15  ALT 11* 9* 11*  ALKPHOS 62 53 52  BILITOT 2.9* 2.2* 1.7*  PROT 5.5* 4.9* 4.9*  ALBUMIN 3.8 3.1* 3.2*   CBC:  Recent Labs Lab 07/11/16 1846  07/12/16 0525 07/13/16 0556  WBC 52.4* 38.8* 39.1*  NEUTROABS  --   --  4.7  HGB 12.4* 10.6* 11.2*  HCT 36.4* 30.5* 32.6*  MCV 97.8 97.1 97.9  PLT 88* 105* 98*   CBG:  Recent Labs Lab 07/12/16 2035 07/13/16 0019 07/13/16 0417 07/13/16 0729 07/13/16 1146  GLUCAP 155* 133* 107* 97 126*    Signed:  Barton Dubois MD.  [facility] 07/13/2016, 12:57 PM

## 2016-07-14 LAB — URINE CULTURE

## 2016-07-15 ENCOUNTER — Encounter (HOSPITAL_COMMUNITY): Payer: Self-pay

## 2016-07-15 ENCOUNTER — Observation Stay (HOSPITAL_COMMUNITY): Payer: Medicare HMO

## 2016-07-15 ENCOUNTER — Inpatient Hospital Stay (HOSPITAL_COMMUNITY)
Admission: EM | Admit: 2016-07-15 | Discharge: 2016-07-18 | DRG: 872 | Disposition: A | Discharge status: Home Health | Payer: Medicare HMO | Attending: Internal Medicine | Admitting: Internal Medicine

## 2016-07-15 DIAGNOSIS — E119 Type 2 diabetes mellitus without complications: Secondary | ICD-10-CM | POA: Diagnosis present

## 2016-07-15 DIAGNOSIS — A419 Sepsis, unspecified organism: Principal | ICD-10-CM

## 2016-07-15 DIAGNOSIS — Z8546 Personal history of malignant neoplasm of prostate: Secondary | ICD-10-CM | POA: Diagnosis not present

## 2016-07-15 DIAGNOSIS — N390 Urinary tract infection, site not specified: Secondary | ICD-10-CM | POA: Diagnosis not present

## 2016-07-15 DIAGNOSIS — Z7902 Long term (current) use of antithrombotics/antiplatelets: Secondary | ICD-10-CM | POA: Diagnosis not present

## 2016-07-15 DIAGNOSIS — C9110 Chronic lymphocytic leukemia of B-cell type not having achieved remission: Secondary | ICD-10-CM | POA: Diagnosis not present

## 2016-07-15 DIAGNOSIS — Z91048 Other nonmedicinal substance allergy status: Secondary | ICD-10-CM

## 2016-07-15 DIAGNOSIS — Z85820 Personal history of malignant melanoma of skin: Secondary | ICD-10-CM

## 2016-07-15 DIAGNOSIS — I639 Cerebral infarction, unspecified: Secondary | ICD-10-CM | POA: Diagnosis not present

## 2016-07-15 DIAGNOSIS — Z794 Long term (current) use of insulin: Secondary | ICD-10-CM | POA: Diagnosis not present

## 2016-07-15 DIAGNOSIS — I517 Cardiomegaly: Secondary | ICD-10-CM | POA: Diagnosis not present

## 2016-07-15 DIAGNOSIS — L03116 Cellulitis of left lower limb: Secondary | ICD-10-CM

## 2016-07-15 DIAGNOSIS — E785 Hyperlipidemia, unspecified: Secondary | ICD-10-CM | POA: Diagnosis not present

## 2016-07-15 DIAGNOSIS — I10 Essential (primary) hypertension: Secondary | ICD-10-CM | POA: Diagnosis present

## 2016-07-15 DIAGNOSIS — N3000 Acute cystitis without hematuria: Secondary | ICD-10-CM | POA: Diagnosis not present

## 2016-07-15 DIAGNOSIS — Z885 Allergy status to narcotic agent status: Secondary | ICD-10-CM | POA: Diagnosis not present

## 2016-07-15 DIAGNOSIS — Z8673 Personal history of transient ischemic attack (TIA), and cerebral infarction without residual deficits: Secondary | ICD-10-CM

## 2016-07-15 DIAGNOSIS — Z87891 Personal history of nicotine dependence: Secondary | ICD-10-CM | POA: Diagnosis not present

## 2016-07-15 DIAGNOSIS — Z79899 Other long term (current) drug therapy: Secondary | ICD-10-CM | POA: Diagnosis not present

## 2016-07-15 DIAGNOSIS — C9190 Lymphoid leukemia, unspecified not having achieved remission: Secondary | ICD-10-CM | POA: Diagnosis not present

## 2016-07-15 LAB — URINALYSIS, ROUTINE W REFLEX MICROSCOPIC
BACTERIA UA: NONE SEEN
Bilirubin Urine: NEGATIVE
GLUCOSE, UA: NEGATIVE mg/dL
Hgb urine dipstick: NEGATIVE
KETONES UR: NEGATIVE mg/dL
LEUKOCYTES UA: NEGATIVE
NITRITE: NEGATIVE
PROTEIN: NEGATIVE mg/dL
Specific Gravity, Urine: 1.011 (ref 1.005–1.030)
pH: 5 (ref 5.0–8.0)

## 2016-07-15 LAB — COMPREHENSIVE METABOLIC PANEL
ALBUMIN: 3.3 g/dL — AB (ref 3.5–5.0)
ALT: 13 U/L — ABNORMAL LOW (ref 17–63)
ANION GAP: 8 (ref 5–15)
AST: 25 U/L (ref 15–41)
Alkaline Phosphatase: 66 U/L (ref 38–126)
BILIRUBIN TOTAL: 1.1 mg/dL (ref 0.3–1.2)
BUN: 16 mg/dL (ref 6–20)
CHLORIDE: 100 mmol/L — AB (ref 101–111)
CO2: 24 mmol/L (ref 22–32)
Calcium: 9.1 mg/dL (ref 8.9–10.3)
Creatinine, Ser: 0.87 mg/dL (ref 0.61–1.24)
GFR calc Af Amer: >60 mL/min (ref >60)
GFR calc non Af Amer: >60 mL/min (ref >60)
GLUCOSE: 123 mg/dL — AB (ref 65–99)
POTASSIUM: 4.1 mmol/L (ref 3.5–5.1)
SODIUM: 132 mmol/L — AB (ref 135–145)
TOTAL PROTEIN: 5.5 g/dL — AB (ref 6.5–8.1)

## 2016-07-15 LAB — CBC WITH DIFFERENTIAL/PLATELET
BASOS ABS: 0 10*3/uL (ref 0.0–0.1)
Basophils Relative: 0 %
Eosinophils Absolute: 0 10*3/uL (ref 0.0–0.7)
Eosinophils Relative: 0 %
HEMATOCRIT: 34.7 % — AB (ref 39.0–52.0)
HEMOGLOBIN: 11.5 g/dL — AB (ref 13.0–17.0)
LYMPHS ABS: 32 10*3/uL — AB (ref 0.7–4.0)
LYMPHS PCT: 88 %
MCH: 32.5 pg (ref 26.0–34.0)
MCHC: 33.1 g/dL (ref 30.0–36.0)
MCV: 98 fL (ref 78.0–100.0)
MONOS PCT: 2 %
Monocytes Absolute: 0.7 10*3/uL (ref 0.1–1.0)
NEUTROS ABS: 3.6 10*3/uL (ref 1.7–7.7)
Neutrophils Relative %: 10 %
Platelets: 122 10*3/uL — ABNORMAL LOW (ref 150–400)
RBC: 3.54 MIL/uL — AB (ref 4.22–5.81)
RDW: 14.4 % (ref 11.5–15.5)
WBC: 36.3 10*3/uL — ABNORMAL HIGH (ref 4.0–10.5)

## 2016-07-15 LAB — LACTIC ACID, PLASMA: Lactic Acid, Venous: 1.5 mmol/L (ref 0.5–1.9)

## 2016-07-15 LAB — I-STAT CG4 LACTIC ACID, ED
LACTIC ACID, VENOUS: 0.94 mmol/L (ref 0.5–1.9)
LACTIC ACID, VENOUS: 1.42 mmol/L (ref 0.5–1.9)

## 2016-07-15 LAB — PROCALCITONIN: Procalcitonin: <0.1 ng/mL

## 2016-07-15 LAB — SEDIMENTATION RATE: Sed Rate: 5 mm/hr (ref 0–16)

## 2016-07-15 MED ORDER — OXYCODONE-ACETAMINOPHEN 5-325 MG PO TABS
1.0000 | ORAL_TABLET | ORAL | Status: DC | PRN
  Administered 2016-07-16: 1 via ORAL
  Filled 2016-07-15: qty 1

## 2016-07-15 MED ORDER — CLOPIDOGREL BISULFATE 75 MG PO TABS
75.0000 mg | ORAL_TABLET | Freq: Every day | ORAL | Status: DC
  Administered 2016-07-16 – 2016-07-18 (×3): 75 mg via ORAL
  Filled 2016-07-15 (×3): qty 1

## 2016-07-15 MED ORDER — ONDANSETRON HCL 4 MG PO TABS: 4.0000 mg | ORAL_TABLET | Freq: Four times a day (QID) | ORAL | Status: DC | PRN

## 2016-07-15 MED ORDER — ENOXAPARIN SODIUM 40 MG/0.4ML ~~LOC~~ SOLN
40.0000 mg | Freq: Every day | SUBCUTANEOUS | Status: DC
  Administered 2016-07-16 – 2016-07-17 (×2): 40 mg via SUBCUTANEOUS
  Filled 2016-07-15 (×2): qty 0.4

## 2016-07-15 MED ORDER — ONDANSETRON HCL 4 MG/2ML IJ SOLN: 4.0000 mg | Freq: Four times a day (QID) | INTRAMUSCULAR | Status: DC | PRN

## 2016-07-15 MED ORDER — CEFUROXIME AXETIL 250 MG PO TABS
250.0000 mg | ORAL_TABLET | Freq: Two times a day (BID) | ORAL | Status: DC
  Administered 2016-07-16 – 2016-07-18 (×5): 250 mg via ORAL
  Filled 2016-07-15 (×5): qty 1

## 2016-07-15 MED ORDER — HYDROCHLOROTHIAZIDE 25 MG PO TABS
25.0000 mg | ORAL_TABLET | Freq: Every day | ORAL | Status: DC
  Administered 2016-07-16 – 2016-07-18 (×3): 25 mg via ORAL
  Filled 2016-07-15 (×3): qty 1

## 2016-07-15 MED ORDER — INSULIN GLARGINE 100 UNIT/ML ~~LOC~~ SOLN
20.0000 [IU] | Freq: Every day | SUBCUTANEOUS | Status: DC
  Administered 2016-07-16 – 2016-07-17 (×2): 20 [IU] via SUBCUTANEOUS
  Filled 2016-07-15 (×4): qty 0.2

## 2016-07-15 MED ORDER — ACETAMINOPHEN 325 MG PO TABS: 650.0000 mg | ORAL_TABLET | Freq: Four times a day (QID) | ORAL | Status: DC | PRN

## 2016-07-15 MED ORDER — ZOLPIDEM TARTRATE 5 MG PO TABS: 5.0000 mg | ORAL_TABLET | Freq: Every evening | ORAL | Status: DC | PRN

## 2016-07-15 MED ORDER — ENSURE ENLIVE PO LIQD
237.0000 mL | Freq: Every day | ORAL | Status: DC
  Administered 2016-07-16 – 2016-07-18 (×2): 237 mL via ORAL

## 2016-07-15 MED ORDER — VANCOMYCIN HCL 10 G IV SOLR
1500.0000 mg | Freq: Once | INTRAVENOUS | Status: AC
  Administered 2016-07-15: 1500 mg via INTRAVENOUS
  Filled 2016-07-15: qty 1500

## 2016-07-15 MED ORDER — ATORVASTATIN CALCIUM 40 MG PO TABS
40.0000 mg | ORAL_TABLET | Freq: Every day | ORAL | Status: DC
  Administered 2016-07-16 – 2016-07-17 (×2): 40 mg via ORAL
  Filled 2016-07-15 (×2): qty 1

## 2016-07-15 MED ORDER — VANCOMYCIN HCL IN DEXTROSE 1-5 GM/200ML-% IV SOLN
1000.0000 mg | Freq: Two times a day (BID) | INTRAVENOUS | Status: DC
  Administered 2016-07-16 – 2016-07-18 (×5): 1000 mg via INTRAVENOUS
  Filled 2016-07-15 (×3): qty 200

## 2016-07-15 MED ORDER — VITAMIN B-12 1000 MCG PO TABS
1000.0000 ug | ORAL_TABLET | Freq: Every day | ORAL | Status: DC
  Administered 2016-07-16 – 2016-07-18 (×3): 1000 ug via ORAL
  Filled 2016-07-15 (×3): qty 1

## 2016-07-15 MED ORDER — LISINOPRIL 20 MG PO TABS
20.0000 mg | ORAL_TABLET | Freq: Every day | ORAL | Status: DC
  Administered 2016-07-16 – 2016-07-18 (×3): 20 mg via ORAL
  Filled 2016-07-15 (×3): qty 1

## 2016-07-15 MED ORDER — LISINOPRIL-HYDROCHLOROTHIAZIDE 20-25 MG PO TABS: 1.0000 | ORAL_TABLET | Freq: Every day | ORAL | Status: DC

## 2016-07-15 MED ORDER — INSULIN ASPART 100 UNIT/ML ~~LOC~~ SOLN
0.0000 [IU] | Freq: Three times a day (TID) | SUBCUTANEOUS | Status: DC
Start: 2016-07-16 — End: 2016-07-18
  Administered 2016-07-16 (×2): 1 [IU] via SUBCUTANEOUS
  Administered 2016-07-17: 2 [IU] via SUBCUTANEOUS
  Administered 2016-07-18: 1 [IU] via SUBCUTANEOUS

## 2016-07-15 NOTE — ED Notes (Signed)
Blood cultures collected prior to vancmycin infusion.

## 2016-07-15 NOTE — ED Provider Notes (Signed)
[facility] DEPT Provider Note   CSN: [id number] Arrival date & time: 07/15/16  1635     History   Chief Complaint Chief Complaint  Patient presents with  . Cellulitis    HPI Brandon Chambers is a 83 y.o. male. Chief complaint is red swollen painful leg and fever.  This is an 83 year old male but was just discharged after a recent stay for acute encephalopathy secondary to urinary tract infection. He has a multiyear history of dependent edema. He is unable to take diuretics because of chronic hypotension and orthostasis. He had SCDs on his legs while hospitalized. He was discharged 2 days ago.  Family noted a red spot on the posterior aspect of his left leg upon arriving home from the hospital. He is on Ceftin for his UTI. His left leg has become progressively red and swollen. His temperature 102 today at home. Family presents him for reevaluation.  He has been weak at home to point he can only take a few steps. Somewhat due to generalized weakness, somewhat due to the leg pain.  HPI  Past Medical History:  Diagnosis Date  . Arthritis   . CLL (chronic lymphocytic leukemia) ([facility])   . Diabetes mellitus ([facility])   . Hypercholesteremia   . Hypertension   . Prostate cancer ([facility])   . Skin cancer (melanoma) ([facility])   . SOB (shortness of breath)   . Stroke [facility])     Patient Active Problem List   Diagnosis Date Noted  . Cellulitis, leg 07/15/2016  . Sepsis ([facility]) 07/15/2016  . UTI (urinary tract infection) 07/12/2016  . Acute lower UTI 07/11/2016  . Hyponatremia 07/11/2016  . Acute metabolic encephalopathy 99/83/3825  . AKI (acute kidney injury) ([facility]) 07/11/2016  . Dehydration 07/11/2016  . Thrombocytopenia ([facility]) 07/11/2016  . QT prolongation 07/11/2016  . HLD (hyperlipidemia) 12/02/2013  . B12 deficiency 12/02/2013  . Prostate cancer ([facility]) 09/04/2012  . CLL (chronic lymphocytic leukemia) ([facility]) 03/05/2012  . Anticoagulated on Coumadin 03/05/2012  . CVA (cerebral vascular  accident) ([facility]) 03/05/2012  . AV block 10/21/2011  . Diabetes mellitus ([facility])   . Hypercholesteremia   . Hypertension   . SOB (shortness of breath)     Past Surgical History:  Procedure Laterality Date  . BYPASS GRAFT Left    leg  . CATARACT EXTRACTION Bilateral   . PROSTATE SURGERY         Home Medications    Prior to Admission medications   Medication Sig Start Date End Date Taking? Authorizing Provider  acetaminophen (TYLENOL) 500 MG tablet Take 1,000 mg by mouth every 6 (six) hours as needed for mild pain or moderate pain.   Yes [provider]  atorvastatin (LIPITOR) 40 MG tablet TAKE 1 TABLET EVERY DAY Patient taking differently: TAKE 1 TABLET BY MOUTH EVERY DAY 10/23/14  Yes Rosalin Hawking, MD  cefUROXime (CEFTIN) 250 MG tablet Take 1 tablet (250 mg total) by mouth 2 (two) times daily with a meal. 07/13/16  Yes Barton Dubois, MD  clopidogrel (PLAVIX) 75 MG tablet TAKE 1 TABLET EVERY DAY 10/23/14  Yes Rosalin Hawking, MD  insulin glargine (LANTUS) 100 UNIT/ML injection Inject 42 units into the skin at bedtime. Patient taking differently: Inject 30 Units into the skin at bedtime. Inject 42 units into the skin at bedtime. 02/17/14  Yes Darlyne Russian, MD  lisinopril-hydrochlorothiazide (PRINZIDE,ZESTORETIC) 20-25 MG per tablet Take 1 tablet by mouth daily. PATIENT NEEDS AN OFFICE VISIT FOR ADDITIONAL REFILLS. 10/24/14  Yes Arlyss Queen  A, MD  vitamin B-12 (CYANOCOBALAMIN) 1000 MCG tablet Take 1 tablet (1,000 mcg total) by mouth daily. 08/30/13  Yes Rosalin Hawking, MD  blood glucose meter kit and supplies KIT Test blood sugar 3 times daily. Dx code: E11.9 04/22/14   Darlyne Russian, MD  feeding supplement, ENSURE ENLIVE, (ENSURE ENLIVE) LIQD Take 237 mLs by mouth daily. Patient not taking: Reported on 07/15/2016 07/13/16   Barton Dubois, MD  glucose blood (RELION GLUCOSE TEST STRIPS) test strip Test blood glucose 3 times a day. Dx Code 250.00 10/16/13   Collene Leyden, PA-C  glucose blood test  strip Test blood sugar 3 times daily. Dx code: E11.9 04/22/14   Darlyne Russian, MD  Lancets MISC Test blood sugar 3 times daily. Dx code: E11.9 04/22/14   Darlyne Russian, MD    Family History Family History  Problem Relation Age of Onset  . Stroke Mother   . Hyperlipidemia Father   . Cancer Brother        type unknown    Social History Social History  Substance Use Topics  . Smoking status: Former Smoker    Packs/day: 0.50    Years: 20.00    Types: Cigarettes    Quit date: 02/15/1975  . Smokeless tobacco: Never Used  . Alcohol use No     Allergies   Adhesive [tape] and Codeine   Review of Systems Review of Systems  Constitutional: Positive for fever. Negative for appetite change, chills, diaphoresis and fatigue.  HENT: Negative for mouth sores, sore throat and trouble swallowing.   Eyes: Negative for visual disturbance.  Respiratory: Negative for cough, chest tightness, shortness of breath and wheezing.   Cardiovascular: Negative for chest pain.  Gastrointestinal: Negative for abdominal distention, abdominal pain, diarrhea, nausea and vomiting.  Endocrine: Negative for polydipsia, polyphagia and polyuria.  Genitourinary: Negative for dysuria, frequency and hematuria.  Musculoskeletal: Negative for gait problem.  Skin: Positive for color change and rash. Negative for pallor.  Neurological: Positive for weakness. Negative for dizziness, syncope, light-headedness and headaches.  Hematological: Does not bruise/bleed easily.  Psychiatric/Behavioral: Negative for behavioral problems and confusion.     Physical Exam Updated Vital Signs BP (!) 131/98 (BP Location: Left Arm)   Pulse 63   Temp 98.7 F (37.1 C) (Oral)   Resp (!) 24   Ht 6' (1.829 m)   Wt 90.7 kg (200 lb)   SpO2 96%   BMI 27.12 kg/m   Physical Exam  Constitutional: He is oriented to person, place, and time. He appears well-developed and well-nourished. No distress.  HENT:  Head: Normocephalic.  Eyes:  Conjunctivae are normal. Pupils are equal, round, and reactive to light. No scleral icterus.  Neck: Normal range of motion. Neck supple. No thyromegaly present.  Cardiovascular: Normal rate and regular rhythm.  Exam reveals no gallop and no friction rub.   No murmur heard. Pulmonary/Chest: Effort normal and breath sounds normal. No respiratory distress. He has no wheezes. He has no rales.  Abdominal: Soft. Bowel sounds are normal. He exhibits no distension. There is no tenderness. There is no rebound.  Musculoskeletal: Normal range of motion.  Neurological: He is alert and oriented to person, place, and time.  Skin: Skin is warm and dry. No rash noted.  Area of erythema throughout the posterior aspect the left lower leg. Central area of large 3 cm diameter bulla containing fluid.  Psychiatric: He has a normal mood and affect. His behavior is normal.  ED Treatments / Results  Labs (all labs ordered are listed, but only abnormal results are displayed) Labs Reviewed  COMPREHENSIVE METABOLIC PANEL - Abnormal; Notable for the following:       Result Value   Sodium 132 (*)    Chloride 100 (*)    Glucose, Bld 123 (*)    Total Protein 5.5 (*)    Albumin 3.3 (*)    ALT 13 (*)    All other components within normal limits  CBC WITH DIFFERENTIAL/PLATELET - Abnormal; Notable for the following:    WBC 36.3 (*)    RBC 3.54 (*)    Hemoglobin 11.5 (*)    HCT 34.7 (*)    Platelets 122 (*)    Lymphs Abs 32.0 (*)    All other components within normal limits  CULTURE, BLOOD (ROUTINE X 2)  CULTURE, BLOOD (ROUTINE X 2)  URINALYSIS, ROUTINE W REFLEX MICROSCOPIC  I-STAT CG4 LACTIC ACID, ED  I-STAT CG4 LACTIC ACID, ED    EKG  EKG Interpretation None       Radiology No results found.  Procedures Procedures (including critical care time)  Medications Ordered in ED Medications  vancomycin (VANCOCIN) 1,500 mg in sodium chloride 0.9 % 500 mL IVPB (1,500 mg Intravenous New Bag/Given  07/15/16 1925)  vancomycin (VANCOCIN) IVPB 1000 mg/200 mL premix (not administered)     Initial Impression / Assessment and Plan / ED Course  I have reviewed the triage vital signs and the nursing notes.  Pertinent labs & imaging results that were available during my care of the patient were reviewed by me and considered in my medical decision making (see chart for details).    Leukocytosis at 36,000 consistent with his . Not acidotic. No lactic acidosis. Cultures pending. Given IV vancomycin.  This is likely hospital-acquired infection. Will convert for MRSA. Discussed with the hospitalist. Patient will be admitted.  Final Clinical Impressions(s) / ED Diagnoses   Final diagnoses:  Cellulitis of left lower extremity    New Prescriptions New Prescriptions   No medications on file     Tanna Furry, MD 07/15/16 2054

## 2016-07-15 NOTE — H&P (Signed)
History and Physical    Brandon Chambers:[id number] DOB: 1928/11/14 DOA: 07/15/2016  Referring MD/NP/PA:   PCP: System, Pcp Not In   Patient coming from:  The patient is coming from home.  At baseline, pt is independent for most of ADL.   Chief Complaint: left leg pain  HPI: Brandon Chambers Brandon Chambers is a 83 y.o. male with medical history significant of hypertension, hyperlipidemia, diabetes mellitus, stroke, melanoma, prostate cancer (s/p of surgery, no radiation or chemotherapy), CLL, PAD (s/p of left leg bypass graft), arthritis, who presents with left leg pain.  Patient was recently hospitalized from 05/28-5/30 due to UTI. Discharged on Ceftin on 5/30 (supposed to take this medication for 8 days for his total). Patient states that he does not have symptoms of UTI currently. Pt states that he was put in compression boots in previous admission. After he went home, he started having left lower leg pain. The left lower leg become erythematous and swelling, with pus drainage. His leg pain is moderate, constant, nonradiating. Patient denies fever, chills. No nausea, vomiting, diarrhea or abdominal pain. Denies chest pain, shortness of breath. He has mild dry cough. No unilateral weakness.   ED Course: pt was found to have  WBC 36.3, lactate is 0.4, creatinine normal, temperature normal, tachypnea, no tachycardia, section 96% on room air. Patient is placed on MedSurg bed for observation.  Review of Systems:   General: no fevers, chills, no changes in body weight, has poor appetite, has fatigue HEENT: no blurry vision, hearing changes or sore throat Respiratory: no dyspnea, has mild coughing, no wheezing CV: no chest pain, no palpitations GI: no nausea, vomiting, abdominal pain, diarrhea, constipation GU: no dysuria, burning on urination, increased urinary frequency, hematuria  Ext: has leg edema Neuro: no unilateral weakness, numbness, or tingling, no vision change or hearing loss Skin: has  erythema, swelling and tenderness in the left lower leg. Has a big bulla in the calf area of left leg. MSK: No muscle spasm, no deformity, no limitation of range of movement in spin Heme: No easy bruising.  Travel history: No recent long distant travel.  Allergy:  Allergies  Allergen Reactions  . Adhesive [Tape] Rash  . Codeine Rash    Past Medical History:  Diagnosis Date  . Arthritis   . CLL (chronic lymphocytic leukemia) ([place])   . Diabetes mellitus ([place])   . Hypercholesteremia   . Hypertension   . Prostate cancer ([place])   . Skin cancer (melanoma) ([place])   . SOB (shortness of breath)   . Stroke [facility])     Past Surgical History:  Procedure Laterality Date  . BYPASS GRAFT Left    leg  . CATARACT EXTRACTION Bilateral   . PROSTATE SURGERY      Social History:  reports that he quit smoking about 41 years ago. His smoking use included Cigarettes. He has a 10.00 pack-year smoking history. He has never used smokeless tobacco. He reports that he does not drink alcohol or use drugs.  Family History:  Family History  Problem Relation Age of Onset  . Stroke Mother   . Hyperlipidemia Father   . Cancer Brother        type unknown     Prior to Admission medications   Medication Sig Start Date End Date Taking? Authorizing Provider  acetaminophen (TYLENOL) 500 MG tablet Take 1,000 mg by mouth every 6 (six) hours as needed for mild pain or moderate pain.   Yes [provider]  atorvastatin (  LIPITOR) 40 MG tablet TAKE 1 TABLET EVERY DAY Patient taking differently: TAKE 1 TABLET BY MOUTH EVERY DAY 10/23/14  Yes Rosalin Hawking, MD  cefUROXime (CEFTIN) 250 MG tablet Take 1 tablet (250 mg total) by mouth 2 (two) times daily with a meal. 07/13/16  Yes Barton Dubois, MD  clopidogrel (PLAVIX) 75 MG tablet TAKE 1 TABLET EVERY DAY 10/23/14  Yes Rosalin Hawking, MD  insulin glargine (LANTUS) 100 UNIT/ML injection Inject 42 units into the skin at bedtime. Patient taking differently: Inject 30  Units into the skin at bedtime. Inject 42 units into the skin at bedtime. 02/17/14  Yes Darlyne Russian, MD  lisinopril-hydrochlorothiazide (PRINZIDE,ZESTORETIC) 20-25 MG per tablet Take 1 tablet by mouth daily. PATIENT NEEDS AN OFFICE VISIT FOR ADDITIONAL REFILLS. 10/24/14  Yes Daub, Loura Back, MD  vitamin B-12 (CYANOCOBALAMIN) 1000 MCG tablet Take 1 tablet (1,000 mcg total) by mouth daily. 08/30/13  Yes Rosalin Hawking, MD  blood glucose meter kit and supplies KIT Test blood sugar 3 times daily. Dx code: E11.9 04/22/14   Darlyne Russian, MD  feeding supplement, ENSURE ENLIVE, (ENSURE ENLIVE) LIQD Take 237 mLs by mouth daily. Patient not taking: Reported on 07/15/2016 07/13/16   Barton Dubois, MD  glucose blood (RELION GLUCOSE TEST STRIPS) test strip Test blood glucose 3 times a day. Dx Code 250.00 10/16/13   Collene Leyden, PA-C  glucose blood test strip Test blood sugar 3 times daily. Dx code: E11.9 04/22/14   Darlyne Russian, MD  Lancets MISC Test blood sugar 3 times daily. Dx code: E11.9 04/22/14   Darlyne Russian, MD    Physical Exam: Vitals:   07/15/16 1708  BP: (!) 131/98  Pulse: 63  Resp: (!) 24  Temp: 98.7 F (37.1 C)  TempSrc: Oral  SpO2: 96%  Weight: 90.7 kg (200 lb)  Height: 6' (1.829 m)   General: Not in acute distress HEENT:       Eyes: PERRL, EOMI, no scleral icterus.       ENT: No discharge from the ears and nose, no pharynx injection, no tonsillar enlargement.        Neck: No JVD, no bruit, no mass felt. Heme: No neck lymph node enlargement. Cardiac: S1/S2, RRR, No murmurs, No gallops or rubs. Respiratory: No rales, wheezing, rhonchi or rubs. GI: Soft, nondistended, nontender, no rebound pain, no organomegaly, BS present. GU: No hematuria Ext: 1+ pitting leg edema bilaterally. 1+DP/PT pulse bilaterally. Musculoskeletal: No joint deformities, No joint redness or warmth, no limitation of ROM in spin. Skin:  has erythema, swelling and tenderness in the left lower leg. Has a big bulla in  the calf area of left leg. Neuro: Alert, oriented X3, cranial nerves II-XII grossly intact, moves all extremities normally.  Psych: Patient is not psychotic, no suicidal or hemocidal ideation. Labs on Admission: I have personally reviewed following labs and imaging studies  CBC:  Recent Labs Lab 07/11/16 1846 07/12/16 0525 07/13/16 0556 07/15/16 1802  WBC 52.4* 38.8* 39.1* 36.3*  NEUTROABS  --   --  4.7 3.6  HGB 12.4* 10.6* 11.2* 11.5*  HCT 36.4* 30.5* 32.6* 34.7*  MCV 97.8 97.1 97.9 98.0  PLT 88* 105* 98* 474*   Basic Metabolic Panel:  Recent Labs Lab 07/11/16 1846 07/12/16 0525 07/13/16 0556 07/15/16 1802  NA 130* 133* 134* 132*  K 4.7 3.4* 4.0 4.1  CL 100* 101 104 100*  CO2 21* '25 25 24  '$ GLUCOSE 161* 99 106* 123*  BUN 19  18 21* 16  CREATININE 1.13 0.91 0.84 0.87  CALCIUM 8.9 8.7* 9.0 9.1  MG  --  1.7 1.9  --   PHOS  --  2.7 2.5  --    GFR: Estimated Creatinine Clearance: 68.1 mL/min (by C-G formula based on SCr of 0.87 mg/dL). Liver Function Tests:  Recent Labs Lab 07/11/16 1846 07/12/16 0525 07/13/16 0556 07/15/16 1802  AST 23 14* 15 25  ALT 11* 9* 11* 13*  ALKPHOS 62 53 52 66  BILITOT 2.9* 2.2* 1.7* 1.1  PROT 5.5* 4.9* 4.9* 5.5*  ALBUMIN 3.8 3.1* 3.2* 3.3*   No results for input(s): LIPASE, AMYLASE in the last 168 hours. No results for input(s): AMMONIA in the last 168 hours. Coagulation Profile: No results for input(s): INR, PROTIME in the last 168 hours. Cardiac Enzymes: No results for input(s): CKTOTAL, CKMB, CKMBINDEX, TROPONINI in the last 168 hours. BNP (last 3 results) No results for input(s): PROBNP in the last 8760 hours. HbA1C: No results for input(s): HGBA1C in the last 72 hours. CBG:  Recent Labs Lab 07/12/16 2035 07/13/16 0019 07/13/16 0417 07/13/16 0729 07/13/16 1146  GLUCAP 155* 133* 107* 97 126*   Lipid Profile: No results for input(s): CHOL, HDL, LDLCALC, TRIG, CHOLHDL, LDLDIRECT in the last 72 hours. Thyroid  Function Tests: No results for input(s): TSH, T4TOTAL, FREET4, T3FREE, THYROIDAB in the last 72 hours. Anemia Panel: No results for input(s): VITAMINB12, FOLATE, FERRITIN, TIBC, IRON, RETICCTPCT in the last 72 hours. Urine analysis:    Component Value Date/Time   COLORURINE YELLOW 07/15/2016 1725   APPEARANCEUR CLEAR 07/15/2016 1725   LABSPEC 1.011 07/15/2016 1725   PHURINE 5.0 07/15/2016 1725   GLUCOSEU NEGATIVE 07/15/2016 1725   HGBUR NEGATIVE 07/15/2016 1725   BILIRUBINUR NEGATIVE 07/15/2016 1725   BILIRUBINUR Negative 01/30/2014 1201   KETONESUR NEGATIVE 07/15/2016 1725   PROTEINUR NEGATIVE 07/15/2016 1725   UROBILINOGEN 1.0 01/30/2014 1201   UROBILINOGEN 0.2 01/22/2008 0851   NITRITE NEGATIVE 07/15/2016 1725   LEUKOCYTESUR NEGATIVE 07/15/2016 1725   Sepsis Labs: '@LABRCNTIP'$ (procalcitonin:4,lacticidven:4) ) Recent Results (from the past 240 hour(s))  Urine culture     Status: Abnormal   Collection Time: 07/11/16  8:21 PM  Result Value Ref Range Status   Specimen Description URINE, CLEAN CATCH  Final   Special Requests NONE  Final   Culture >=100,000 COLONIES/mL ESCHERICHIA COLI (A)  Final   Report Status 07/14/2016 FINAL  Final   Organism ID, Bacteria ESCHERICHIA COLI (A)  Final      Susceptibility   Escherichia coli - MIC*    AMPICILLIN <=2 SENSITIVE Sensitive     CEFAZOLIN <=4 SENSITIVE Sensitive     CEFTRIAXONE <=1 SENSITIVE Sensitive     CIPROFLOXACIN <=0.25 SENSITIVE Sensitive     GENTAMICIN <=1 SENSITIVE Sensitive     IMIPENEM <=0.25 SENSITIVE Sensitive     NITROFURANTOIN <=16 SENSITIVE Sensitive     TRIMETH/SULFA <=20 SENSITIVE Sensitive     AMPICILLIN/SULBACTAM <=2 SENSITIVE Sensitive     PIP/TAZO <=4 SENSITIVE Sensitive     Extended ESBL NEGATIVE Sensitive     * >=100,000 COLONIES/mL ESCHERICHIA COLI     Radiological Exams on Admission: No results found.   EKG: Not done in ED, will get one.   Assessment/Plan Principal Problem:   Cellulitis of left  leg Active Problems:   Diabetes mellitus (HCC)   Hypertension   CLL (chronic lymphocytic leukemia) (HCC)   CVA (cerebral vascular accident) ([place])   HLD (hyperlipidemia)   UTI (urinary  tract infection)   Sepsis ([place])   Cellulitis of left leg and possible sepsis: Patient has erythema, swelling, warmth, tenderness in left lower leg, consistent with cellulitis. Patient meet criteria for sepsis with leukocytosis and tachypnea, no tachycardia. Lactic acid is normal. Hemodynamically stable currently. Pt is s/p of left leg bypass graft.  - will place on med-surg bed for obs - Empiric antimicrobial treatment with vancomycin - PRN Percocet for pain - Blood cultures x 2  - ESR and CRP - wound care consult - will get Procalcitonin and trend lactic acid levels per sepsis protocol. - LE doppler to r/o DVT - get ABI  Recent UTI (urinary tract infection): no symptoms currently. -The patient is to complete 8-day course of Ceftin  DM-II: Last A1c 6/0 on 07/12/17, well controled. Patient is taking lantus at home -will decrease Lantus dose from 30 to 20 units daily -SSI  HTN: -continue prinzid  CLL (chronic lymphocytic leukemia) ([place]): not being treated now. WBC 36.3 which likely is paritally due to infection. -f/u by CBC -f/u with oncologist.  Hx of stroke -continue Plavix and Lipitor  HLD; -Lipitor   DVT ppx: Q Lovenox Code Status: Full code Family Communication: Yes, patient's 2 daughters at bed side Disposition Plan:  Anticipate discharge back to previous home environment Consults called:  none Admission status:  medical floor/obs    Date of Service 07/15/2016    Ivor Costa [facility] Pager [phone]  If 7PM-7AM, please contact night-coverage [email or web address] Password [facility] 07/15/2016, 8:39 PM

## 2016-07-15 NOTE — ED Notes (Addendum)
Called 3W again to get an update.  Brandon Chambers reported that the nurse on call hasn't checked in as of yet.  Joe AC and Terri CN made aware.    Family remains at bedside and are updated on reason for delay.  Verbalizes understanding.

## 2016-07-15 NOTE — ED Notes (Signed)
Called 3W to give report, was notified that the floor is waiting for a nurse.  Will call back in an hour.  CN made aware

## 2016-07-15 NOTE — ED Notes (Signed)
Pt is resting comfortably with family at bedside.  Eating and drinking without difficulty.  PO okayed by Jeneen Rinks EDP.

## 2016-07-15 NOTE — ED Triage Notes (Addendum)
Pt BIB family c/o L leg cellulitis. Pt was recently admitted on the 4th floor and was put in compression boots. Pt now has redness and pus drainage to the back of his L leg. He reports pain, but denies nausea or fever. A&Ox4.

## 2016-07-15 NOTE — Progress Notes (Signed)
Pharmacy Antibiotic Note  Brandon Chambers is a 83 y.o. male admitted on 07/15/2016 with cellulitis.  Pt with redness and pus drainage doe the back of left leg. Pharmacy has been consulted for vancomycin dosing.  Pt with recent admission. Pt diagnosed with E.coli UTI and was discharged on Ceftin.   Plan:  Vancomycin 1500 mg IV x1 then vancomycin 1000 mg IV q12h, target trough 10-15 mcg/ml  Monitor clinical course, renal function, cultures as available   Height: 6' (182.9 cm) Weight: 200 lb (90.7 kg) IBW/kg (Calculated) : 77.6  Temp (24hrs), Avg:98.7 F (37.1 C), Min:98.7 F (37.1 C), Max:98.7 F (37.1 C)   Recent Labs Lab 07/11/16 1846 07/11/16 2110 07/12/16 0525 07/13/16 0556 07/15/16 1802 07/15/16 1808  WBC 52.4*  --  38.8* 39.1*  --   --   CREATININE 1.13  --  0.91 0.84 0.87  --   LATICACIDVEN  --  0.92  --   --   --  0.94    Estimated Creatinine Clearance: 68.1 mL/min (by C-G formula based on SCr of 0.87 mg/dL).    Allergies  Allergen Reactions  . Adhesive [Tape] Rash  . Codeine Rash    Antimicrobials this admission:  6/1 vancomycin >>    Dose adjustments this admission: ---  Microbiology results: 6/1 BCx: sent  Thank you for allowing pharmacy to be a part of this patient's care.   Royetta Asal, PharmD, BCPS Pager [phone] 07/15/2016 7:37 PM

## 2016-07-16 ENCOUNTER — Observation Stay (HOSPITAL_COMMUNITY): Payer: Medicare HMO

## 2016-07-16 DIAGNOSIS — Z91048 Other nonmedicinal substance allergy status: Secondary | ICD-10-CM | POA: Diagnosis not present

## 2016-07-16 DIAGNOSIS — Z7902 Long term (current) use of antithrombotics/antiplatelets: Secondary | ICD-10-CM | POA: Diagnosis not present

## 2016-07-16 DIAGNOSIS — L03116 Cellulitis of left lower limb: Secondary | ICD-10-CM | POA: Diagnosis present

## 2016-07-16 DIAGNOSIS — N390 Urinary tract infection, site not specified: Secondary | ICD-10-CM | POA: Diagnosis present

## 2016-07-16 DIAGNOSIS — E785 Hyperlipidemia, unspecified: Secondary | ICD-10-CM | POA: Diagnosis present

## 2016-07-16 DIAGNOSIS — I10 Essential (primary) hypertension: Secondary | ICD-10-CM | POA: Diagnosis present

## 2016-07-16 DIAGNOSIS — N3000 Acute cystitis without hematuria: Secondary | ICD-10-CM | POA: Diagnosis not present

## 2016-07-16 DIAGNOSIS — A419 Sepsis, unspecified organism: Secondary | ICD-10-CM | POA: Diagnosis present

## 2016-07-16 DIAGNOSIS — C9110 Chronic lymphocytic leukemia of B-cell type not having achieved remission: Secondary | ICD-10-CM | POA: Diagnosis present

## 2016-07-16 DIAGNOSIS — Z8673 Personal history of transient ischemic attack (TIA), and cerebral infarction without residual deficits: Secondary | ICD-10-CM | POA: Diagnosis not present

## 2016-07-16 DIAGNOSIS — Z885 Allergy status to narcotic agent status: Secondary | ICD-10-CM | POA: Diagnosis not present

## 2016-07-16 DIAGNOSIS — E119 Type 2 diabetes mellitus without complications: Secondary | ICD-10-CM

## 2016-07-16 DIAGNOSIS — Z87891 Personal history of nicotine dependence: Secondary | ICD-10-CM | POA: Diagnosis not present

## 2016-07-16 DIAGNOSIS — Z794 Long term (current) use of insulin: Secondary | ICD-10-CM | POA: Diagnosis not present

## 2016-07-16 DIAGNOSIS — C9190 Lymphoid leukemia, unspecified not having achieved remission: Secondary | ICD-10-CM

## 2016-07-16 DIAGNOSIS — Z79899 Other long term (current) drug therapy: Secondary | ICD-10-CM | POA: Diagnosis not present

## 2016-07-16 DIAGNOSIS — Z85820 Personal history of malignant melanoma of skin: Secondary | ICD-10-CM | POA: Diagnosis not present

## 2016-07-16 DIAGNOSIS — Z8546 Personal history of malignant neoplasm of prostate: Secondary | ICD-10-CM | POA: Diagnosis not present

## 2016-07-16 LAB — GLUCOSE, CAPILLARY
GLUCOSE-CAPILLARY: 110 mg/dL — AB (ref 65–99)
Glucose-Capillary: 147 mg/dL — ABNORMAL HIGH (ref 65–99)
Glucose-Capillary: 128 mg/dL — ABNORMAL HIGH (ref 65–99)
Glucose-Capillary: 181 mg/dL — ABNORMAL HIGH (ref 65–99)

## 2016-07-16 LAB — BASIC METABOLIC PANEL
Anion gap: 4 — ABNORMAL LOW (ref 5–15)
BUN: 13 mg/dL (ref 6–20)
CALCIUM: 8.8 mg/dL — AB (ref 8.9–10.3)
CO2: 27 mmol/L (ref 22–32)
Chloride: 103 mmol/L (ref 101–111)
Creatinine, Ser: 0.75 mg/dL (ref 0.61–1.24)
GFR calc Af Amer: >60 mL/min (ref >60)
GLUCOSE: 108 mg/dL — AB (ref 65–99)
Potassium: 3.6 mmol/L (ref 3.5–5.1)
Sodium: 134 mmol/L — ABNORMAL LOW (ref 135–145)

## 2016-07-16 LAB — CBC
HCT: 31.8 % — ABNORMAL LOW (ref 39.0–52.0)
Hemoglobin: 10.6 g/dL — ABNORMAL LOW (ref 13.0–17.0)
MCH: 32.6 pg (ref 26.0–34.0)
MCHC: 33.3 g/dL (ref 30.0–36.0)
MCV: 97.8 fL (ref 78.0–100.0)
PLATELETS: 118 10*3/uL — AB (ref 150–400)
RBC: 3.25 MIL/uL — ABNORMAL LOW (ref 4.22–5.81)
RDW: 14.3 % (ref 11.5–15.5)
WBC: 31.9 10*3/uL — ABNORMAL HIGH (ref 4.0–10.5)

## 2016-07-16 LAB — C-REACTIVE PROTEIN: CRP: <0.8 mg/dL (ref <1.0)

## 2016-07-16 LAB — LACTIC ACID, PLASMA: Lactic Acid, Venous: 1.2 mmol/L (ref 0.5–1.9)

## 2016-07-16 NOTE — Progress Notes (Signed)
*  PRELIMINARY RESULTS* Vascular Ultrasound Left lower extremity venous duplex has been completed.  Preliminary findings: No obvious evidence of DVT or baker's cyst.    Landry Mellow, RDMS, RVT  07/16/2016, 12:07 PM

## 2016-07-16 NOTE — Progress Notes (Signed)
PROGRESS NOTE  Brandon Chambers BJY:[id number] DOB: 1928-05-09 DOA: 07/15/2016 PCP: System, Pcp Not In   LOS: 0 days   Brief Narrative / Interim history: Brandon Chambers is a 83 y.o. male with medical history significant of hypertension, hyperlipidemia, diabetes mellitus, stroke, melanoma, prostate cancer (s/p of surgery, no radiation or chemotherapy), CLL, PAD (s/p of left leg bypass graft), arthritis, who presents with left leg pain.  He was hospitalized and discharged 3 days ago for UTI, and the day after discharge he noticed his left lower extremity became red and tender.  He developed some cellulitic changes in that area and came back to the emergency room.  Assessment & Plan: Principal Problem:   Cellulitis of left leg Active Problems:   Diabetes mellitus (HCC)   Hypertension   CLL (chronic lymphocytic leukemia) (HCC)   CVA (cerebral vascular accident) ([place])   HLD (hyperlipidemia)   UTI (urinary tract infection)   Sepsis ([place])   Left lower extremity cellulitis -Continue vancomycin for now, margins drawn this morning -Lower extremity Doppler and ABI are pending -Blood cultures obtained in the ED, patient has chronic leukocytosis due to his CLL, his lactic acid was normal, sepsis unlikely  Recent urinary tract infection -Continue Ceftin  Type 2 diabetes mellitus -Last hemoglobin A1c 6.0 last week, continue Lantus and sliding scale  Hypertension -Continue home medications  CLL -Followed by oncology as an outpatient  Prior history of CVA -Continue Plavix and Lipitor  Hyperlipidemia -Continue Lipitor   DVT prophylaxis: Lovenox Code Status: Full code Family Communication: Extended family at bedside Disposition Plan: Home when ready  Consultants:   None  Procedures:   None   Antimicrobials:  Vancomycin 6/1 >>   Ceftin PTA   Subjective: - no chest pain, shortness of breath, no abdominal pain, nausea or vomiting.  Feels better, his left lower extremity  is not as painful as it was yesterday  Objective: Vitals:   07/15/16 2300 07/15/16 2332 07/16/16 0428 07/16/16 0933  BP: (!) 149/79 (!) 150/62 134/73 130/60  Pulse: 72 68 71   Resp: (!) 23 (!) 22 20   Temp:  97 F (36.1 C) 99 F (37.2 C)   TempSrc:  Oral Oral   SpO2:  98% 99%   Weight:      Height:        Intake/Output Summary (Last 24 hours) at 07/16/16 1125 Last data filed at 07/16/16 0127  Gross per 24 hour  Intake              500 ml  Output              350 ml  Net              150 ml   Filed Weights   07/15/16 1708  Weight: 90.7 kg (200 lb)    Examination:  Vitals:   07/15/16 2300 07/15/16 2332 07/16/16 0428 07/16/16 0933  BP: (!) 149/79 (!) 150/62 134/73 130/60  Pulse: 72 68 71   Resp: (!) 23 (!) 22 20   Temp:  97 F (36.1 C) 99 F (37.2 C)   TempSrc:  Oral Oral   SpO2:  98% 99%   Weight:      Height:        Constitutional: NAD Eyes: PERRL, lids and conjunctivae normal ENMT: Mucous membranes are moist Respiratory: clear to auscultation bilaterally, no wheezing, no crackles.  Cardiovascular: Regular rate and rhythm, no murmurs / rubs / gallops. 1+ pitting LE edema.  Abdomen: no tenderness. Bowel sounds positive.  Skin: Round about 10 cm erythematous area on the medial aspect of the left shin, tender to palpation, no fluctuance noted Neurologic: CN 2-12 grossly intact. Strength 5/5 in all 4.  Psychiatric: Normal judgment and insight. Alert and oriented x 3. Normal mood.    Data Reviewed: I have personally reviewed following labs and imaging studies  CBC:  Recent Labs Lab 07/11/16 1846 07/12/16 0525 07/13/16 0556 07/15/16 1802 07/16/16 0407  WBC 52.4* 38.8* 39.1* 36.3* 31.9*  NEUTROABS  --   --  4.7 3.6  --   HGB 12.4* 10.6* 11.2* 11.5* 10.6*  HCT 36.4* 30.5* 32.6* 34.7* 31.8*  MCV 97.8 97.1 97.9 98.0 97.8  PLT 88* 105* 98* 122* 710*   Basic Metabolic Panel:  Recent Labs Lab 07/11/16 1846 07/12/16 0525 07/13/16 0556 07/15/16 1802  07/16/16 0407  NA 130* 133* 134* 132* 134*  K 4.7 3.4* 4.0 4.1 3.6  CL 100* 101 104 100* 103  CO2 21* 25 25 24 27   GLUCOSE 161* 99 106* 123* 108*  BUN 19 18 21* 16 13  CREATININE 1.13 0.91 0.84 0.87 0.75  CALCIUM 8.9 8.7* 9.0 9.1 8.8*  MG  --  1.7 1.9  --   --   PHOS  --  2.7 2.5  --   --    GFR: Estimated Creatinine Clearance: 74.1 mL/min (by C-G formula based on SCr of 0.75 mg/dL). Liver Function Tests:  Recent Labs Lab 07/11/16 1846 07/12/16 0525 07/13/16 0556 07/15/16 1802  AST 23 14* 15 25  ALT 11* 9* 11* 13*  ALKPHOS 62 53 52 66  BILITOT 2.9* 2.2* 1.7* 1.1  PROT 5.5* 4.9* 4.9* 5.5*  ALBUMIN 3.8 3.1* 3.2* 3.3*   No results for input(s): LIPASE, AMYLASE in the last 168 hours. No results for input(s): AMMONIA in the last 168 hours. Coagulation Profile: No results for input(s): INR, PROTIME in the last 168 hours. Cardiac Enzymes: No results for input(s): CKTOTAL, CKMB, CKMBINDEX, TROPONINI in the last 168 hours. BNP (last 3 results) No results for input(s): PROBNP in the last 8760 hours. HbA1C: No results for input(s): HGBA1C in the last 72 hours. CBG:  Recent Labs Lab 07/13/16 0019 07/13/16 0417 07/13/16 0729 07/13/16 1146 07/16/16 0734  GLUCAP 133* 107* 97 126* 110*   Lipid Profile: No results for input(s): CHOL, HDL, LDLCALC, TRIG, CHOLHDL, LDLDIRECT in the last 72 hours. Thyroid Function Tests: No results for input(s): TSH, T4TOTAL, FREET4, T3FREE, THYROIDAB in the last 72 hours. Anemia Panel: No results for input(s): VITAMINB12, FOLATE, FERRITIN, TIBC, IRON, RETICCTPCT in the last 72 hours. Urine analysis:    Component Value Date/Time   COLORURINE YELLOW 07/15/2016 [place] 07/15/2016 1725   LABSPEC 1.011 07/15/2016 1725   PHURINE 5.0 07/15/2016 1725   GLUCOSEU NEGATIVE 07/15/2016 1725   HGBUR NEGATIVE 07/15/2016 1725   BILIRUBINUR NEGATIVE 07/15/2016 1725   BILIRUBINUR Negative 01/30/2014 1201   KETONESUR NEGATIVE 07/15/2016  1725   PROTEINUR NEGATIVE 07/15/2016 1725   UROBILINOGEN 1.0 01/30/2014 1201   UROBILINOGEN 0.2 01/22/2008 0851   NITRITE NEGATIVE 07/15/2016 1725   LEUKOCYTESUR NEGATIVE 07/15/2016 1725   Sepsis Labs: Invalid input(s): PROCALCITONIN, LACTICIDVEN  Recent Results (from the past 240 hour(s))  Urine culture     Status: Abnormal   Collection Time: 07/11/16  8:21 PM  Result Value Ref Range Status   Specimen Description URINE, CLEAN CATCH  Final   Special Requests NONE  Final  Culture >=100,000 COLONIES/mL ESCHERICHIA COLI (A)  Final   Report Status 07/14/2016 FINAL  Final   Organism ID, Bacteria ESCHERICHIA COLI (A)  Final      Susceptibility   Escherichia coli - MIC*    AMPICILLIN <=2 SENSITIVE Sensitive     CEFAZOLIN <=4 SENSITIVE Sensitive     CEFTRIAXONE <=1 SENSITIVE Sensitive     CIPROFLOXACIN <=0.25 SENSITIVE Sensitive     GENTAMICIN <=1 SENSITIVE Sensitive     IMIPENEM <=0.25 SENSITIVE Sensitive     NITROFURANTOIN <=16 SENSITIVE Sensitive     TRIMETH/SULFA <=20 SENSITIVE Sensitive     AMPICILLIN/SULBACTAM <=2 SENSITIVE Sensitive     PIP/TAZO <=4 SENSITIVE Sensitive     Extended ESBL NEGATIVE Sensitive     * >=100,000 COLONIES/mL ESCHERICHIA COLI      Radiology Studies: Dg Chest Port 1 View  Result Date: 07/15/2016 CLINICAL DATA:  Swollen painful leg with fever EXAM: PORTABLE CHEST 1 VIEW COMPARISON:  07/11/2016 FINDINGS: Mild cardiomegaly without edema. Aortic atherosclerosis. No consolidation or pleural effusion. No pneumothorax. IMPRESSION: Mild cardiomegaly.  No edema or infiltrate. Electronically Signed   By: Donavan Foil M.D.   On: 07/15/2016 21:00     Scheduled Meds: . atorvastatin  40 mg Oral q1800  . cefUROXime  250 mg Oral BID WC  . clopidogrel  75 mg Oral QAC breakfast  . enoxaparin (LOVENOX) injection  40 mg Subcutaneous QHS  . feeding supplement (ENSURE ENLIVE)  237 mL Oral Daily  . hydrochlorothiazide  25 mg Oral Daily  . insulin aspart  0-9 Units  Subcutaneous TID WC  . insulin glargine  20 Units Subcutaneous QHS  . lisinopril  20 mg Oral Daily  . vitamin B-12  1,000 mcg Oral Daily   Continuous Infusions: . vancomycin Stopped (07/16/16 0850)    Marzetta Board, MD, PhD [facility] Pager [phone]  If 7PM-7AM, please contact night-coverage [email or web address] Password [email or web address] 07/16/2016, 11:25 AM

## 2016-07-17 ENCOUNTER — Inpatient Hospital Stay (HOSPITAL_COMMUNITY): Payer: Medicare HMO

## 2016-07-17 DIAGNOSIS — I10 Essential (primary) hypertension: Secondary | ICD-10-CM

## 2016-07-17 DIAGNOSIS — L03116 Cellulitis of left lower limb: Secondary | ICD-10-CM

## 2016-07-17 LAB — CREATININE, SERUM
Creatinine, Ser: 0.81 mg/dL (ref 0.61–1.24)
GFR calc Af Amer: >60 mL/min (ref >60)
GFR calc non Af Amer: >60 mL/min (ref >60)

## 2016-07-17 LAB — GLUCOSE, CAPILLARY
GLUCOSE-CAPILLARY: 113 mg/dL — AB (ref 65–99)
GLUCOSE-CAPILLARY: 178 mg/dL — AB (ref 65–99)
Glucose-Capillary: 171 mg/dL — ABNORMAL HIGH (ref 65–99)
Glucose-Capillary: 111 mg/dL — ABNORMAL HIGH (ref 65–99)

## 2016-07-17 NOTE — Progress Notes (Signed)
VASCULAR LAB PRELIMINARY  ARTERIAL  ABI completed: Unable to accurately obtain a right ABI due to non-compressible arteries likely as a result of medial calcification. Monophasic waveforms detected in the dorsalis pedis, and posterior tibial arteries suggests abnormal arterial flow at rest. Left ABI of 0.72 is suggest of moderate arterial occlusive disease at rest. Unable to accurately obtain TBI's due to inconsistent/low amplitude waveforms.   RIGHT    LEFT    PRESSURE WAVEFORM  PRESSURE WAVEFORM  BRACHIAL 175 Triphasic BRACHIAL 169 Triphasic  DP >254 Monophasic DP 126 Monophasic  PT >254 Monophasic PT 107 Monophasic    RIGHT LEFT  ABI  0.72     Legrand Como, RVT 07/17/2016, 10:09 AM

## 2016-07-17 NOTE — Evaluation (Signed)
Physical Therapy Evaluation Patient Details Name: Brandon Chambers MRN: [id number] DOB: 1928/06/05 Today's Date: 07/17/2016   History of Present Illness  Brandon Chambers is a 83 y.o. male with medical history significant of DM 2, HTN, HLD, CLL, left brain stroke in 1992. Admitted recently with UTI, readmitted now for cellulitis   Clinical Impression  Pt admitted with above diagnosis. Pt currently with functional limitations due to the deficits listed below (see PT Problem List).  Pt will benefit from skilled PT to increase their independence and safety with mobility to allow discharge to the venue listed below.  Will benefit from HHPT     Follow Up Recommendations Home health PT;Supervision/Assistance - 24 hour    Equipment Recommendations  None recommended by PT    Recommendations for Other Services       Precautions / Restrictions Precautions Precautions: Fall      Mobility  Bed Mobility Overal bed mobility: Needs Assistance Bed Mobility: Supine to Sit     Supine to sit: Min guard     General bed mobility comments: extra time to  to get legs over to bed edge and to sit up  Transfers Overall transfer level: Needs assistance Equipment used: Rolling walker (2 wheeled) Transfers: Sit to/from Stand Sit to Stand: Min assist;Min guard         General transfer comment: extra time and boost to stand from low bed, cues for hand placement  Ambulation/Gait Ambulation/Gait assistance: Min guard Ambulation Distance (Feet): 50 Feet Assistive device: Rolling walker (2 wheeled) Gait Pattern/deviations: Step-through pattern;Decreased stride length;Trunk flexed     General Gait Details: cues for RW position and safety with turns  Science writer    Modified Rankin (Stroke Patients Only)       Balance Overall balance assessment: Needs assistance Sitting-balance support: Feet supported;No upper extremity supported Sitting balance-Leahy  Scale: Good       Standing balance-Leahy Scale: Poor Standing balance comment: guarded/unsteady  when not supported by UEs                             Pertinent Vitals/Pain Pain Assessment: No/denies pain    Home Living Family/patient expects to be discharged to:: Private residence Living Arrangements: Children (twin daughters)   Type of Home: House Home Access: Stairs to enter   Entrance Stairs-Number of Steps: small step Home Layout: One level Home Equipment: Environmental consultant - 2 wheels;Cane - quad      Prior Function Level of Independence: Independent with assistive device(s);Independent         Comments: pt states he does not use his ADs, he does better without them     Hand Dominance        Extremity/Trunk Assessment   Upper Extremity Assessment Upper Extremity Assessment: Generalized weakness    Lower Extremity Assessment Lower Extremity Assessment: Generalized weakness LLE Deficits / Details: AROM grossly WFL; cellulitic changes posterior LE, with pitting edema, appears to possibly be a small wound present center area of erythema LLE Sensation: history of peripheral neuropathy       Communication   Communication: No difficulties  Cognition Arousal/Alertness: Awake/alert Behavior During Therapy: WFL for tasks assessed/performed Overall Cognitive Status: Within Functional Limits for tasks assessed  General Comments      Exercises     Assessment/Plan    PT Assessment Patient needs continued PT services  PT Problem List Decreased strength;Decreased activity tolerance;Decreased balance;Decreased mobility;Decreased knowledge of use of DME;Decreased safety awareness;Decreased knowledge of precautions       PT Treatment Interventions DME instruction;Gait training;Functional mobility training;Therapeutic activities;Therapeutic exercise;Balance training;Patient/family education    PT Goals  (Current goals can be found in the Care Plan section)  Acute Rehab PT Goals Patient Stated Goal: to go home PT Goal Formulation: With patient Time For Goal Achievement: 07/26/16 Potential to Achieve Goals: Good    Frequency Min 3X/week   Barriers to discharge        Co-evaluation               AM-PAC PT "6 Clicks" Daily Activity  Outcome Measure Difficulty turning over in bed (including adjusting bedclothes, sheets and blankets)?: A Little Difficulty moving from lying on back to sitting on the side of the bed? : A Little Difficulty sitting down on and standing up from a chair with arms (e.g., wheelchair, bedside commode, etc,.)?: A Little Help needed moving to and from a bed to chair (including a wheelchair)?: A Little Help needed walking in hospital room?: A Little Help needed climbing 3-5 steps with a railing? : A Lot 6 Click Score: 17    End of Session   Activity Tolerance: Patient tolerated treatment well Patient left: in chair;with call bell/phone within reach;with chair alarm set Nurse Communication: Mobility status PT Visit Diagnosis: Unsteadiness on feet (R26.81);Muscle weakness (generalized) (M62.81)    Time: 1610-9604 PT Time Calculation (min) (ACUTE ONLY): 21 min   Charges:   PT Evaluation $PT Eval Low Complexity: 1 Procedure     PT G CodesKenyon Ana, PT Pager: [phone] 07/17/2016   [facility] 07/17/2016, 12:15 PM

## 2016-07-17 NOTE — Progress Notes (Signed)
PROGRESS NOTE  Brandon Chambers IOE:[id number] DOB: 02-10-1929 DOA: 07/15/2016 PCP: System, Pcp Not In   LOS: 1 day   Brief Narrative / Interim history: Brandon Chambers is a 83 y.o. male with medical history significant of hypertension, hyperlipidemia, diabetes mellitus, stroke, melanoma, prostate cancer (s/p of surgery, no radiation or chemotherapy), CLL, PAD (s/p of left leg bypass graft), arthritis, who presents with left leg pain.  He was hospitalized and discharged 3 days ago for UTI, and the day after discharge he noticed his left lower extremity became red and tender.  He developed some cellulitic changes in that area and came back to the emergency room.  Assessment & Plan: Principal Problem:   Cellulitis of left leg Active Problems:   Diabetes mellitus ([place])   Hypertension   CLL (chronic lymphocytic leukemia) ([facility])   CVA (cerebral vascular accident) ([place])   HLD (hyperlipidemia)   UTI (urinary tract infection)   Sepsis ([place])   Left lower extremity cellulitis -Continue vancomycin for now, agents appears to be receding based on the line drawn yesterday -Lower extremity Doppler negative for DVT and ABI shows moderate disease on the left.  Will need outpatient follow-up with vascular.  Recent urinary tract infection -Continue Ceftin  Type 2 diabetes mellitus -Last hemoglobin A1c 6.0 last week, continue Lantus and sliding scale -CBGs fair in the last 24 hours between 113 and 181  Hypertension -Continue home medications -Blood pressure okay this morning at 147/70  CLL -Followed by oncology as an outpatient  Prior history of CVA -Continue Plavix and Lipitor  Hyperlipidemia -Continue Lipitor   DVT prophylaxis: Lovenox Code Status: Full code Family Communication: Daughter at bedside Disposition Plan: Home when ready, likely 1 day.  PT evaluation pending  Consultants:   None  Procedures:   None   Antimicrobials:  Vancomycin 6/1 >>   Ceftin PTA    Subjective: -Feels good, appreciate his left pain in his left leg  Objective: Vitals:   07/16/16 1325 07/16/16 2046 07/17/16 0500 07/17/16 0920  BP: 130/68 (!) 147/67 (!) 161/87 (!) 147/70  Pulse: (!) 56 67 63   Resp: 18 19 18    Temp: 99.1 F (37.3 C) 97.6 F (36.4 C) 97.9 F (36.6 C)   TempSrc: Oral Oral Oral   SpO2: 97% 98% 99%   Weight:      Height:        Intake/Output Summary (Last 24 hours) at 07/17/16 1211 Last data filed at 07/17/16 0920  Gross per 24 hour  Intake              500 ml  Output             1050 ml  Net             -550 ml   Filed Weights   07/15/16 1708  Weight: 90.7 kg (200 lb)    Examination:  Vitals:   07/16/16 1325 07/16/16 2046 07/17/16 0500 07/17/16 0920  BP: 130/68 (!) 147/67 (!) 161/87 (!) 147/70  Pulse: (!) 56 67 63   Resp: 18 19 18    Temp: 99.1 F (37.3 C) 97.6 F (36.4 C) 97.9 F (36.6 C)   TempSrc: Oral Oral Oral   SpO2: 97% 98% 99%   Weight:      Height:       Constitutional: NAD, calm, comfortable Eyes: lids and conjunctivae normal Respiratory: clear to auscultation bilaterally, no wheezing, no crackles. Cardiovascular: Regular rate and rhythm, no murmurs / rubs / gallops.  No LE edema. Abdomen: no tenderness. Bowel sounds positive.  Skin: Round erythematous area on the medial aspect of the left shin, improving Neurologic: non focal  Data Reviewed: I have personally reviewed following labs and imaging studies  CBC:  Recent Labs Lab 07/11/16 1846 07/12/16 0525 07/13/16 0556 07/15/16 1802 07/16/16 0407  WBC 52.4* 38.8* 39.1* 36.3* 31.9*  NEUTROABS  --   --  4.7 3.6  --   HGB 12.4* 10.6* 11.2* 11.5* 10.6*  HCT 36.4* 30.5* 32.6* 34.7* 31.8*  MCV 97.8 97.1 97.9 98.0 97.8  PLT 88* 105* 98* 122* 270*   Basic Metabolic Panel:  Recent Labs Lab 07/11/16 1846 07/12/16 0525 07/13/16 0556 07/15/16 1802 07/16/16 0407 07/17/16 0405  NA 130* 133* 134* 132* 134*  --   K 4.7 3.4* 4.0 4.1 3.6  --   CL 100* 101  104 100* 103  --   CO2 21* 25 25 24 27   --   GLUCOSE 161* 99 106* 123* 108*  --   BUN 19 18 21* 16 13  --   CREATININE 1.13 0.91 0.84 0.87 0.75 0.81  CALCIUM 8.9 8.7* 9.0 9.1 8.8*  --   MG  --  1.7 1.9  --   --   --   PHOS  --  2.7 2.5  --   --   --    GFR: Estimated Creatinine Clearance: 73.2 mL/min (by C-G formula based on SCr of 0.81 mg/dL). Liver Function Tests:  Recent Labs Lab 07/11/16 1846 07/12/16 0525 07/13/16 0556 07/15/16 1802  AST 23 14* 15 25  ALT 11* 9* 11* 13*  ALKPHOS 62 53 52 66  BILITOT 2.9* 2.2* 1.7* 1.1  PROT 5.5* 4.9* 4.9* 5.5*  ALBUMIN 3.8 3.1* 3.2* 3.3*   No results for input(s): LIPASE, AMYLASE in the last 168 hours. No results for input(s): AMMONIA in the last 168 hours. Coagulation Profile: No results for input(s): INR, PROTIME in the last 168 hours. Cardiac Enzymes: No results for input(s): CKTOTAL, CKMB, CKMBINDEX, TROPONINI in the last 168 hours. BNP (last 3 results) No results for input(s): PROBNP in the last 8760 hours. HbA1C: No results for input(s): HGBA1C in the last 72 hours. CBG:  Recent Labs Lab 07/16/16 1136 07/16/16 1613 07/16/16 2314 07/17/16 0746 07/17/16 1140  GLUCAP 147* 128* 181* 113* 171*   Lipid Profile: No results for input(s): CHOL, HDL, LDLCALC, TRIG, CHOLHDL, LDLDIRECT in the last 72 hours. Thyroid Function Tests: No results for input(s): TSH, T4TOTAL, FREET4, T3FREE, THYROIDAB in the last 72 hours. Anemia Panel: No results for input(s): VITAMINB12, FOLATE, FERRITIN, TIBC, IRON, RETICCTPCT in the last 72 hours. Urine analysis:    Component Value Date/Time   COLORURINE YELLOW 07/15/2016 [place] 07/15/2016 1725   LABSPEC 1.011 07/15/2016 1725   PHURINE 5.0 07/15/2016 1725   GLUCOSEU NEGATIVE 07/15/2016 1725   HGBUR NEGATIVE 07/15/2016 1725   BILIRUBINUR NEGATIVE 07/15/2016 1725   BILIRUBINUR Negative 01/30/2014 1201   KETONESUR NEGATIVE 07/15/2016 1725   PROTEINUR NEGATIVE 07/15/2016  1725   UROBILINOGEN 1.0 01/30/2014 1201   UROBILINOGEN 0.2 01/22/2008 0851   NITRITE NEGATIVE 07/15/2016 1725   LEUKOCYTESUR NEGATIVE 07/15/2016 1725   Sepsis Labs: Invalid input(s): PROCALCITONIN, LACTICIDVEN  Recent Results (from the past 240 hour(s))  Urine culture     Status: Abnormal   Collection Time: 07/11/16  8:21 PM  Result Value Ref Range Status   Specimen Description URINE, CLEAN CATCH  Final   Special Requests NONE  Final   Culture >=100,000 COLONIES/mL ESCHERICHIA COLI (A)  Final   Report Status 07/14/2016 FINAL  Final   Organism ID, Bacteria ESCHERICHIA COLI (A)  Final      Susceptibility   Escherichia coli - MIC*    AMPICILLIN <=2 SENSITIVE Sensitive     CEFAZOLIN <=4 SENSITIVE Sensitive     CEFTRIAXONE <=1 SENSITIVE Sensitive     CIPROFLOXACIN <=0.25 SENSITIVE Sensitive     GENTAMICIN <=1 SENSITIVE Sensitive     IMIPENEM <=0.25 SENSITIVE Sensitive     NITROFURANTOIN <=16 SENSITIVE Sensitive     TRIMETH/SULFA <=20 SENSITIVE Sensitive     AMPICILLIN/SULBACTAM <=2 SENSITIVE Sensitive     PIP/TAZO <=4 SENSITIVE Sensitive     Extended ESBL NEGATIVE Sensitive     * >=100,000 COLONIES/mL ESCHERICHIA COLI  Culture, blood (Routine X 2) w Reflex to ID Panel     Status: None (Preliminary result)   Collection Time: 07/15/16  7:16 PM  Result Value Ref Range Status   Specimen Description BLOOD RIGHT WRIST  Final   Special Requests   Final    BOTTLES DRAWN AEROBIC AND ANAEROBIC Blood Culture adequate volume   Culture   Final    NO GROWTH < 24 HOURS Performed at [facility], [address] [place], [place] [address]    Report Status PENDING  Incomplete  Culture, blood (Routine X 2) w Reflex to ID Panel     Status: None (Preliminary result)   Collection Time: 07/15/16  7:23 PM  Result Value Ref Range Status   Specimen Description BLOOD LEFT ANTECUBITAL  Final   Special Requests   Final    BOTTLES DRAWN AEROBIC AND ANAEROBIC Blood Culture adequate volume    Culture   Final    NO GROWTH < 24 HOURS Performed at [facility], [place] [address], [place], [place] [address]    Report Status PENDING  Incomplete      Radiology Studies: Dg Chest Port 1 View  Result Date: 07/15/2016 CLINICAL DATA:  Swollen painful leg with fever EXAM: PORTABLE CHEST 1 VIEW COMPARISON:  07/11/2016 FINDINGS: Mild cardiomegaly without edema. Aortic atherosclerosis. No consolidation or pleural effusion. No pneumothorax. IMPRESSION: Mild cardiomegaly.  No edema or infiltrate. Electronically Signed   By: Donavan Foil M.D.   On: 07/15/2016 21:00     Scheduled Meds: . atorvastatin  40 mg Oral q1800  . cefUROXime  250 mg Oral BID WC  . clopidogrel  75 mg Oral QAC breakfast  . enoxaparin (LOVENOX) injection  40 mg Subcutaneous QHS  . feeding supplement (ENSURE ENLIVE)  237 mL Oral Daily  . hydrochlorothiazide  25 mg Oral Daily  . insulin aspart  0-9 Units Subcutaneous TID WC  . insulin glargine  20 Units Subcutaneous QHS  . lisinopril  20 mg Oral Daily  . vitamin B-12  1,000 mcg Oral Daily   Continuous Infusions: . vancomycin Stopped (07/17/16 0851)    Marzetta Board, MD, PhD [facility] Pager [phone]  If 7PM-7AM, please contact night-coverage [email or web address] Password [email or web address] 07/17/2016, 12:11 PM

## 2016-07-18 DIAGNOSIS — A419 Sepsis, unspecified organism: Principal | ICD-10-CM

## 2016-07-18 LAB — GLUCOSE, CAPILLARY
Glucose-Capillary: 107 mg/dL — ABNORMAL HIGH (ref 65–99)
Glucose-Capillary: 148 mg/dL — ABNORMAL HIGH (ref 65–99)

## 2016-07-18 LAB — CREATININE, SERUM
Creatinine, Ser: 0.77 mg/dL (ref 0.61–1.24)
GFR calc non Af Amer: >60 mL/min (ref >60)

## 2016-07-18 MED ORDER — DOXYCYCLINE HYCLATE 100 MG PO CAPS: 100.0000 mg | ORAL_CAPSULE | Freq: Two times a day (BID) | ORAL | 0 refills | Status: DC

## 2016-07-18 NOTE — Progress Notes (Signed)
Discharge instructions reviewed with patient and son. Both verbalized understanding. Patient discharged via private vehicle with son.

## 2016-07-18 NOTE — Discharge Summary (Signed)
Physician Discharge Summary  Brandon Chambers SNK:[id number] DOB: Dec 04, 1928 DOA: 07/15/2016  PCP: System, Pcp Not In  Admit date: 07/15/2016 Discharge date: 07/18/2016  Admitted From: home Disposition:  home  Recommendations for Outpatient Follow-up:  1. Follow up with PCP in 1-2 weeks  Home Health: PT, RN, SW Equipment/Devices: none  Discharge Condition: stable CODE STATUS: Full code Diet recommendation: regular  HPI: Per Dr. Blaine Hamper, Brandon Chambers is a 83 y.o. male with medical history significant of hypertension, hyperlipidemia, diabetes mellitus, stroke, melanoma, prostate cancer (s/p of surgery, no radiation or chemotherapy), CLL, PAD (s/p of left leg bypass graft), arthritis, who presents with left leg pain. Patient was recently hospitalized from 05/28-5/30 due to UTI. Discharged on Ceftin on 5/30 (supposed to take this medication for 8 days for his total). Patient states that he does not have symptoms of UTI currently. Pt states that he was put in compression boots in previous admission. After he went home, he started having left lower leg pain. The left lower leg become erythematous and swelling, with pus drainage. His leg pain is moderate, constant, nonradiating. Patient denies fever, chills. No nausea, vomiting, diarrhea or abdominal pain. Denies chest pain, shortness of breath. He has mild dry cough. No unilateral weakness. ED Course: pt was found to have  WBC 36.3, lactate is 0.4, creatinine normal, temperature normal, tachypnea, no tachycardia, section 96% on room air. Patient is placed on MedSurg bed for observation.  Hospital Course: Discharge Diagnoses:  Principal Problem:   Cellulitis of left leg Active Problems:   Diabetes mellitus ([place])   Hypertension   CLL (chronic lymphocytic leukemia) ([facility])   CVA (cerebral vascular accident) ([place])   HLD (hyperlipidemia)   UTI (urinary tract infection)   Left lower extremity cellulitis -patient was admitted to the hospital with  left lower extremity cellulitis.  No apparent injury.  He was placed on vancomycin, with significant improvement in his erythema, and he cellulitis was starting to recede.  He was transitioned to doxycycline, and he is to continue that for 7 additional days. Lower extremity Doppler negative for DVT and ABI shows moderate disease on the left which is not new.  Outpatient follow-up Recent urinary tract infection -Continue Ceftin for 2 additional days to complete a total 10 day course Type 2 diabetes mellitus -continue medications Hypertension -Continue home medications CLL -Followed by oncology as an outpatient, chronic leukocytosis Prior history of CVA -Continue Plavix and Lipitor.  Patient's family concerned about their ability to care for the patient at home and inquiring for SNF/rehab, however patient refused and was adamant about returning home  Hyperlipidemia -Continue Lipitor   Discharge Instructions   Allergies as of 07/18/2016      Reactions   Adhesive [tape] Rash   Codeine Rash      Medication List    TAKE these medications   acetaminophen 500 MG tablet Commonly known as:  TYLENOL Take 1,000 mg by mouth every 6 (six) hours as needed for mild pain or moderate pain.   atorvastatin 40 MG tablet Commonly known as:  LIPITOR TAKE 1 TABLET EVERY DAY What changed:  See the new instructions.   blood glucose meter kit and supplies Kit Test blood sugar 3 times daily. Dx code: E11.9   cefUROXime 250 MG tablet Commonly known as:  CEFTIN Take 1 tablet (250 mg total) by mouth 2 (two) times daily with a meal.   clopidogrel 75 MG tablet Commonly known as:  PLAVIX TAKE 1 TABLET EVERY DAY  doxycycline 100 MG capsule Commonly known as:  VIBRAMYCIN Take 1 capsule (100 mg total) by mouth 2 (two) times daily.   feeding supplement (ENSURE ENLIVE) Liqd Take 237 mLs by mouth daily.   glucose blood test strip Commonly known as:  RELION GLUCOSE TEST STRIPS Test blood glucose 3 times a  day. Dx Code 250.00   glucose blood test strip Test blood sugar 3 times daily. Dx code: E11.9   insulin glargine 100 UNIT/ML injection Commonly known as:  LANTUS Inject 42 units into the skin at bedtime. What changed:  how much to take  how to take this  when to take this  additional instructions   Lancets Misc Test blood sugar 3 times daily. Dx code: E11.9   lisinopril-hydrochlorothiazide 20-25 MG tablet Commonly known as:  PRINZIDE,ZESTORETIC Take 1 tablet by mouth daily. PATIENT NEEDS AN OFFICE VISIT FOR ADDITIONAL REFILLS.   vitamin B-12 1000 MCG tablet Commonly known as:  CYANOCOBALAMIN Take 1 tablet (1,000 mcg total) by mouth daily.      Follow-up Information    Home, [facility] Follow up.   Specialty:  Home Health Services Why:  [facility] nursing/physical therapy,social worker Contact information: [place] [address] [phone]          Allergies  Allergen Reactions  . Adhesive [Tape] Rash  . Codeine Rash    Consultations:  None   Procedures/Studies:  Ct Head Wo Contrast  Result Date: 07/11/2016 CLINICAL DATA:  83 year old male with altered mental status. EXAM: CT HEAD WITHOUT CONTRAST TECHNIQUE: Contiguous axial images were obtained from the base of the skull through the vertex without intravenous contrast. COMPARISON:  None. FINDINGS: Brain: There is moderate age-related atrophy and chronic microvascular ischemic changes. There is no acute intracranial hemorrhage. No mass effect or midline shift noted. No extra-axial fluid collection. Vascular: No hyperdense vessel or unexpected calcification. Skull: Normal. Negative for fracture or focal lesion. Sinuses/Orbits: No acute finding. Other: None IMPRESSION: 1. No acute intracranial hemorrhage. 2. Moderate age-related atrophy and chronic microvascular ischemic changes. If symptoms persist, and there are no contraindications, MRI may provide better evaluation if clinically indicated.  Electronically Signed   By: Anner Crete M.D.   On: 07/11/2016 21:42   Dg Chest Port 1 View  Result Date: 07/15/2016 CLINICAL DATA:  Swollen painful leg with fever EXAM: PORTABLE CHEST 1 VIEW COMPARISON:  07/11/2016 FINDINGS: Mild cardiomegaly without edema. Aortic atherosclerosis. No consolidation or pleural effusion. No pneumothorax. IMPRESSION: Mild cardiomegaly.  No edema or infiltrate. Electronically Signed   By: Donavan Foil M.D.   On: 07/15/2016 21:00   Dg Chest Port 1 View  Result Date: 07/11/2016 CLINICAL DATA:  Altered mental status. EXAM: PORTABLE CHEST 1 VIEW COMPARISON:  Radiographs of January 22, 2008. FINDINGS: Stable cardiomediastinal silhouette. No pneumothorax or pleural effusion is noted. Both lungs are clear. The visualized skeletal structures are unremarkable. IMPRESSION: No acute cardiopulmonary abnormality seen. Electronically Signed   By: Marijo Conception, M.D.   On: 07/11/2016 21:28      Subjective: - no chest pain, shortness of breath, no abdominal pain, nausea or vomiting.   Discharge Exam: Vitals:   07/17/16 2101 07/18/16 0456  BP: (!) 136/57 (!) 142/86  Pulse: 61 68  Resp: 18 20  Temp: 98.1 F (36.7 C) 98.1 F (36.7 C)   Vitals:   07/17/16 0920 07/17/16 1500 07/17/16 2101 07/18/16 0456  BP: (!) 147/70 128/61 (!) 136/57 (!) 142/86  Pulse:  65 61 68  Resp:  '16 18 20  '$ Temp:  98 F (36.7 C) 98.1 F (36.7 C) 98.1 F (36.7 C)  TempSrc:  Oral Oral Oral  SpO2:  97% 96% 98%  Weight:      Height:       General: Pt is alert, awake, not in acute distress Cardiovascular: RRR, S1/S2 +, no rubs, no gallops Respiratory: CTA bilaterally, no wheezing, no rhonchi  The results of significant diagnostics from this hospitalization (including imaging, microbiology, ancillary and laboratory) are listed below for reference.     Microbiology: Recent Results (from the past 240 hour(s))  Urine culture     Status: Abnormal   Collection Time: 07/11/16  8:21 PM    Result Value Ref Range Status   Specimen Description URINE, CLEAN CATCH  Final   Special Requests NONE  Final   Culture >=100,000 COLONIES/mL ESCHERICHIA COLI (A)  Final   Report Status 07/14/2016 FINAL  Final   Organism ID, Bacteria ESCHERICHIA COLI (A)  Final      Susceptibility   Escherichia coli - MIC*    AMPICILLIN <=2 SENSITIVE Sensitive     CEFAZOLIN <=4 SENSITIVE Sensitive     CEFTRIAXONE <=1 SENSITIVE Sensitive     CIPROFLOXACIN <=0.25 SENSITIVE Sensitive     GENTAMICIN <=1 SENSITIVE Sensitive     IMIPENEM <=0.25 SENSITIVE Sensitive     NITROFURANTOIN <=16 SENSITIVE Sensitive     TRIMETH/SULFA <=20 SENSITIVE Sensitive     AMPICILLIN/SULBACTAM <=2 SENSITIVE Sensitive     PIP/TAZO <=4 SENSITIVE Sensitive     Extended ESBL NEGATIVE Sensitive     * >=100,000 COLONIES/mL ESCHERICHIA COLI  Culture, blood (Routine X 2) w Reflex to ID Panel     Status: None (Preliminary result)   Collection Time: 07/15/16  7:16 PM  Result Value Ref Range Status   Specimen Description BLOOD RIGHT WRIST  Final   Special Requests   Final    BOTTLES DRAWN AEROBIC AND ANAEROBIC Blood Culture adequate volume   Culture   Final    NO GROWTH 2 DAYS Performed at [facility], [address], [place], [place] [address]    Report Status PENDING  Incomplete  Culture, blood (Routine X 2) w Reflex to ID Panel     Status: None (Preliminary result)   Collection Time: 07/15/16  7:23 PM  Result Value Ref Range Status   Specimen Description BLOOD LEFT ANTECUBITAL  Final   Special Requests   Final    BOTTLES DRAWN AEROBIC AND ANAEROBIC Blood Culture adequate volume   Culture   Final    NO GROWTH 2 DAYS Performed at [facility], [place] [address], [place], [place] [address]    Report Status PENDING  Incomplete     Labs: BNP (last 3 results) No results for input(s): BNP in the last 8760 hours. Basic Metabolic Panel:  Recent Labs Lab 07/11/16 1846 07/12/16 0525 07/13/16 0556 07/15/16 1802  07/16/16 0407 07/17/16 0405 07/18/16 0403  NA 130* 133* 134* 132* 134*  --   --   K 4.7 3.4* 4.0 4.1 3.6  --   --   CL 100* 101 104 100* 103  --   --   CO2 21* '25 25 24 27  '$ --   --   GLUCOSE 161* 99 106* 123* 108*  --   --   BUN 19 18 21* 16 13  --   --   CREATININE 1.13 0.91 0.84 0.87 0.75 0.81 0.77  CALCIUM 8.9  8.7* 9.0 9.1 8.8*  --   --   MG  --  1.7 1.9  --   --   --   --   PHOS  --  2.7 2.5  --   --   --   --    Liver Function Tests:  Recent Labs Lab 07/11/16 1846 07/12/16 0525 07/13/16 0556 07/15/16 1802  AST 23 14* 15 25  ALT 11* 9* 11* 13*  ALKPHOS 62 53 52 66  BILITOT 2.9* 2.2* 1.7* 1.1  PROT 5.5* 4.9* 4.9* 5.5*  ALBUMIN 3.8 3.1* 3.2* 3.3*   No results for input(s): LIPASE, AMYLASE in the last 168 hours. No results for input(s): AMMONIA in the last 168 hours. CBC:  Recent Labs Lab 07/11/16 1846 07/12/16 0525 07/13/16 0556 07/15/16 1802 07/16/16 0407  WBC 52.4* 38.8* 39.1* 36.3* 31.9*  NEUTROABS  --   --  4.7 3.6  --   HGB 12.4* 10.6* 11.2* 11.5* 10.6*  HCT 36.4* 30.5* 32.6* 34.7* 31.8*  MCV 97.8 97.1 97.9 98.0 97.8  PLT 88* 105* 98* 122* 118*   Cardiac Enzymes: No results for input(s): CKTOTAL, CKMB, CKMBINDEX, TROPONINI in the last 168 hours. BNP: Invalid input(s): POCBNP CBG:  Recent Labs Lab 07/17/16 1140 07/17/16 1649 07/17/16 2002 07/18/16 0801 07/18/16 1132  GLUCAP 171* 111* 178* 107* 148*   D-Dimer No results for input(s): DDIMER in the last 72 hours. Hgb A1c No results for input(s): HGBA1C in the last 72 hours. Lipid Profile No results for input(s): CHOL, HDL, LDLCALC, TRIG, CHOLHDL, LDLDIRECT in the last 72 hours. Thyroid function studies No results for input(s): TSH, T4TOTAL, T3FREE, THYROIDAB in the last 72 hours.  Invalid input(s): FREET3 Anemia work up No results for input(s): VITAMINB12, FOLATE, FERRITIN, TIBC, IRON, RETICCTPCT in the last 72 hours. Urinalysis    Component Value Date/Time   COLORURINE YELLOW  07/15/2016 1725   APPEARANCEUR CLEAR 07/15/2016 1725   LABSPEC 1.011 07/15/2016 1725   PHURINE 5.0 07/15/2016 1725   GLUCOSEU NEGATIVE 07/15/2016 1725   HGBUR NEGATIVE 07/15/2016 1725   BILIRUBINUR NEGATIVE 07/15/2016 1725   BILIRUBINUR Negative 01/30/2014 [place] 07/15/2016 1725   PROTEINUR NEGATIVE 07/15/2016 1725   UROBILINOGEN 1.0 01/30/2014 1201   UROBILINOGEN 0.2 01/22/2008 0851   NITRITE NEGATIVE 07/15/2016 1725   LEUKOCYTESUR NEGATIVE 07/15/2016 1725   Sepsis Labs Invalid input(s): PROCALCITONIN,  WBC,  LACTICIDVEN Microbiology Recent Results (from the past 240 hour(s))  Urine culture     Status: Abnormal   Collection Time: 07/11/16  8:21 PM  Result Value Ref Range Status   Specimen Description URINE, CLEAN CATCH  Final   Special Requests NONE  Final   Culture >=100,000 COLONIES/mL ESCHERICHIA COLI (A)  Final   Report Status 07/14/2016 FINAL  Final   Organism ID, Bacteria ESCHERICHIA COLI (A)  Final      Susceptibility   Escherichia coli - MIC*    AMPICILLIN <=2 SENSITIVE Sensitive     CEFAZOLIN <=4 SENSITIVE Sensitive     CEFTRIAXONE <=1 SENSITIVE Sensitive     CIPROFLOXACIN <=0.25 SENSITIVE Sensitive     GENTAMICIN <=1 SENSITIVE Sensitive     IMIPENEM <=0.25 SENSITIVE Sensitive     NITROFURANTOIN <=16 SENSITIVE Sensitive     TRIMETH/SULFA <=20 SENSITIVE Sensitive     AMPICILLIN/SULBACTAM <=2 SENSITIVE Sensitive     PIP/TAZO <=4 SENSITIVE Sensitive     Extended ESBL NEGATIVE Sensitive     * >=100,000 COLONIES/mL ESCHERICHIA COLI  Culture, blood (Routine  X 2) w Reflex to ID Panel     Status: None (Preliminary result)   Collection Time: 07/15/16  7:16 PM  Result Value Ref Range Status   Specimen Description BLOOD RIGHT WRIST  Final   Special Requests   Final    BOTTLES DRAWN AEROBIC AND ANAEROBIC Blood Culture adequate volume   Culture   Final    NO GROWTH 2 DAYS Performed at [facility], [address] [address] [place], [place] [address]     Report Status PENDING  Incomplete  Culture, blood (Routine X 2) w Reflex to ID Panel     Status: None (Preliminary result)   Collection Time: 07/15/16  7:23 PM  Result Value Ref Range Status   Specimen Description BLOOD LEFT ANTECUBITAL  Final   Special Requests   Final    BOTTLES DRAWN AEROBIC AND ANAEROBIC Blood Culture adequate volume   Culture   Final    NO GROWTH 2 DAYS Performed at [facility], [place] [address], [place], [place] [address]    Report Status PENDING  Incomplete    Time coordinating discharge: 40 minutes  SIGNED:  Marzetta Board, MD  [facility] 07/18/2016, 1:00 PM Pager [phone]  If 7PM-7AM, please contact night-coverage [email or web address] Password [email or web address]

## 2016-07-18 NOTE — Progress Notes (Signed)
CSW notified by MD family interested in SNF placement for pt.  Met with pt, son, and daughters. Pt lives with daughters. Discussed process of seeking SNF placement and potential that it would be out-of-pocket expense due to prior auth being needed due to pt's insurance and PT eval recommending pt walking 50 ft min assist- home health PT follow up.   However, family and pt state that pt is not willing to pursue a SNF whether covered by insurance or private pay. They are concerned about his need for 24/7 supervision as they feel family "will not be able to help him get up in the middle of the night, he'll get up and not ask for help." CSW discussed importance of complying with supervision recommendations on pt's part and discussed private sitter option as well to assist with supervision. Family declines interest in Neurosurgeon.  Pt states he is willing to work with HHPT and family agreeable to being given options. CM following as well.  Sharren Bridge, MSW, LCSW Clinical Social Work 07/18/2016 [phone]

## 2016-07-18 NOTE — Discharge Instructions (Signed)
Take Cefuroxime (Ceftin) for 3 more days to complete a total 10 day course Take Doxycycline twice daily for 7 more days. Start taking it 07/18/2016 in the evening   [facility] 1-2 weeks  Please get a complete blood count and chemistry panel checked by your Primary MD at your next visit, and again as instructed by your Primary MD. Please get your medications reviewed and adjusted by your Primary MD.  Please request your Primary MD to go over all Hospital Tests and Procedure/Radiological results at the follow up, please get all Hospital records sent to your Prim MD by signing hospital release before you go home.  If you had Pneumonia of Lung problems at the Hospital: Please get a 2 view Chest X ray done in 6-8 weeks after hospital discharge or sooner if instructed by your Primary MD.  If you have Congestive Heart Failure: Please call your Cardiologist or Primary MD anytime you have any of the following symptoms:  1) 3 pound weight gain in 24 hours or 5 pounds in 1 week  2) shortness of breath, with or without a dry hacking cough  3) swelling in the hands, feet or stomach  4) if you have to sleep on extra pillows at night in order to breathe  Follow cardiac low salt diet and 1.5 lit/day fluid restriction.  If you have diabetes Accuchecks 4 times/day, Once in AM empty stomach and then before each meal. Log in all results and show them to your primary doctor at your next visit. If any glucose reading is under 80 or above 300 call your primary MD immediately.  If you have Seizure/Convulsions/Epilepsy: Please do not drive, operate heavy machinery, participate in activities at heights or participate in high speed sports until you have seen by Primary MD or a Neurologist and advised to do so again.  If you had Gastrointestinal Bleeding: Please ask your Primary MD to check a complete blood count within one week of discharge or at your next visit. Your endoscopic/colonoscopic biopsies  that are pending at the time of discharge, will also need to followed by your Primary MD.  Get Medicines reviewed and adjusted. Please take all your medications with you for your next visit with your Primary MD  Please request your Primary MD to go over all hospital tests and procedure/radiological results at the follow up, please ask your Primary MD to get all Hospital records sent to his/her office.  If you experience worsening of your admission symptoms, develop shortness of breath, life threatening emergency, suicidal or homicidal thoughts you must seek medical attention immediately by calling 911 or calling your MD immediately  if symptoms less severe.  You must read complete instructions/literature along with all the possible adverse reactions/side effects for all the Medicines you take and that have been prescribed to you. Take any new Medicines after you have completely understood and accpet all the possible adverse reactions/side effects.   Do not drive or operate heavy machinery when taking Pain medications.   Do not take more than prescribed Pain, Sleep and Anxiety Medications  Special Instructions: If you have smoked or chewed Tobacco  in the last 2 yrs please stop smoking, stop any regular Alcohol  and or any Recreational drug use.  Wear Seat belts while driving.  Please note You were cared for by a hospitalist during your hospital stay. If you have any questions about your discharge medications or the care you received while you were in the hospital  after you are discharged, you can call the unit and asked to speak with the hospitalist on call if the hospitalist that took care of you is not available. Once you are discharged, your primary care physician will handle any further medical issues. Please note that NO REFILLS for any discharge medications will be authorized once you are discharged, as it is imperative that you return to your primary care physician (or establish a  relationship with a primary care physician if you do not have one) for your aftercare needs so that they can reassess your need for medications and monitor your lab values.  You can reach the hospitalist office at phone [phone] or fax [phone]   If you do not have a primary care physician, you can call [phone] for a physician referral.  Activity: As tolerated with Full fall precautions use walker/cane & assistance as needed  Diet: regular  Disposition Home

## 2016-07-18 NOTE — Care Management Note (Signed)
Case Management Note  Patient Details  Name: Brandon Chambers MRN: [id number] Date of Birth: 10-25-1928  Subjective/Objective:  83 y/o m admitted w/L leg cellulitis. From home. Readmit. Patient familiar from last admission-[facility] was set up but patient was readmitted prior to their start of services. PT-recc SNF-patient declined SNF.Patient chose [facility] again-rep Tim aware.HHRN/PT/social worker. @ daughters in home, Son doesn't live in home but complains that his siblings doesn't help @ home. Son was trying to get HCPOA set up prior admission.  Patient declined for me to contact any family member to let them know the d/c plan. MD aware to add HHRN/social worker. Nsg updated. No further CM needs.                  Action/Plan:d/c home w/HHC.   Expected Discharge Date:  07/18/16               Expected Discharge Plan:  [place]  In-House Referral:     Discharge planning Services  CM Consult  Post Acute Care Choice:  [place] ([facility] chosen but did not start services prior to admission) Choice offered to:  Patient  DME Arranged:    DME Agency:     HH Arranged:  RN, PT, Refused SNF, Social Work [employer] Agency:  [facility] [employer]  Status of Service:  Completed, signed off  If discussed at [employer], dates discussed:    Additional Comments:  Dessa Phi, RN 07/18/2016, 11:07 AM

## 2016-07-18 NOTE — Progress Notes (Signed)
Pharmacy Antibiotic Note  Brandon Chambers is a 83 y.o. male admitted on 07/15/2016 with cellulitis.  Pt with redness and pus drainage from the back of left leg. Pharmacy has been consulted for vancomycin dosing.  Pt with recent admission. Pt diagnosed with E.coli UTI and was discharged on Ceftin.   Wound improved, afebrile, renal function wnl, holding on trough today while await disposition plans, anticipated po abx today.  Plan:  Vancomycin 1500 mg IV x1 then vancomycin 1000 mg IV q12h, target trough 10-15 mcg/ml  Monitor clinical course, renal function, cultures as available  Height: 6' (182.9 cm) Weight: 200 lb (90.7 kg) IBW/kg (Calculated) : 77.6  Temp (24hrs), Avg:98.1 F (36.7 C), Min:98 F (36.7 C), Max:98.1 F (36.7 C)   Recent Labs Lab 07/11/16 1846 07/11/16 2110 07/12/16 0525 07/13/16 0556 07/15/16 1802 07/15/16 1808 07/15/16 2112 07/15/16 2130 07/15/16 2330 07/16/16 0407 07/17/16 0405 07/18/16 0403  WBC 52.4*  --  38.8* 39.1* 36.3*  --   --   --   --  31.9*  --   --   CREATININE 1.13  --  0.91 0.84 0.87  --   --   --   --  0.75 0.81 0.77  LATICACIDVEN  --  0.92  --   --   --  0.94 1.42 1.5 1.2  --   --   --     Estimated Creatinine Clearance: 74.1 mL/min (by C-G formula based on SCr of 0.77 mg/dL).    Allergies  Allergen Reactions  . Adhesive [Tape] Rash  . Codeine Rash   Antimicrobials this admission:  6/1 vancomycin >>  6/2 ceftin >> (orig stop date 6/7)  Dose adjustments this admission:  Microbiology results: 5/28 UCx: E.coli, pan sens 6/1 BCx: ngtd  Thank you for allowing pharmacy to be a part of this patient's care.  Minda Ditto PharmD Pager [phone] 07/18/2016, 10:00 AM

## 2016-07-19 DIAGNOSIS — I4430 Unspecified atrioventricular block: Secondary | ICD-10-CM | POA: Diagnosis not present

## 2016-07-19 DIAGNOSIS — M6281 Muscle weakness (generalized): Secondary | ICD-10-CM | POA: Diagnosis not present

## 2016-07-19 DIAGNOSIS — N390 Urinary tract infection, site not specified: Secondary | ICD-10-CM | POA: Diagnosis not present

## 2016-07-19 DIAGNOSIS — E119 Type 2 diabetes mellitus without complications: Secondary | ICD-10-CM | POA: Diagnosis not present

## 2016-07-19 DIAGNOSIS — L03116 Cellulitis of left lower limb: Secondary | ICD-10-CM | POA: Diagnosis not present

## 2016-07-19 DIAGNOSIS — I10 Essential (primary) hypertension: Secondary | ICD-10-CM | POA: Diagnosis not present

## 2016-07-20 DIAGNOSIS — C44329 Squamous cell carcinoma of skin of other parts of face: Secondary | ICD-10-CM | POA: Diagnosis not present

## 2016-07-20 LAB — CULTURE, BLOOD (ROUTINE X 2)
CULTURE: NO GROWTH
CULTURE: NO GROWTH
Special Requests: ADEQUATE
Special Requests: ADEQUATE

## 2016-07-20 NOTE — ED Provider Notes (Signed)
[facility] DEPT Provider Note   CSN: [id number] Arrival date & time: 07/11/16  1821     History   Chief Complaint Chief Complaint  Patient presents with  . Altered Mental Status    HPI Brandon Chambers is a 83 y.o. male.  HPI His family report that he has had a gradual decline in general function over about 3 days duration. He has had increased general weakness, poor appetite and episodes of confusion. Patient has CLL that is not under active treatment. No documented fever. Family expressed concern for strokelike symptoms with slurred speech and confusion. There however is not an abrupt onset of symptoms. Past Medical History:  Diagnosis Date  . Arthritis   . CLL (chronic lymphocytic leukemia) ([place])   . Diabetes mellitus ([place])   . Hypercholesteremia   . Hypertension   . Prostate cancer ([place])   . Skin cancer (melanoma) ([place])   . SOB (shortness of breath)   . Stroke [facility])     Patient Active Problem List   Diagnosis Date Noted  . Cellulitis of left leg 07/15/2016  . UTI (urinary tract infection) 07/12/2016  . Acute lower UTI 07/11/2016  . Hyponatremia 07/11/2016  . Acute metabolic encephalopathy 75/11/2583  . AKI (acute kidney injury) ([place]) 07/11/2016  . Dehydration 07/11/2016  . Thrombocytopenia ([place]) 07/11/2016  . QT prolongation 07/11/2016  . HLD (hyperlipidemia) 12/02/2013  . B12 deficiency 12/02/2013  . Prostate cancer ([place]) 09/04/2012  . CLL (chronic lymphocytic leukemia) ([place]) 03/05/2012  . Anticoagulated on Coumadin 03/05/2012  . CVA (cerebral vascular accident) ([place]) 03/05/2012  . AV block 10/21/2011  . Diabetes mellitus ([place])   . Hypercholesteremia   . Hypertension   . SOB (shortness of breath)     Past Surgical History:  Procedure Laterality Date  . BYPASS GRAFT Left    leg  . CATARACT EXTRACTION Bilateral   . PROSTATE SURGERY         Home Medications    Prior to Admission medications   Medication Sig Start Date End Date Taking?  Authorizing Provider  acetaminophen (TYLENOL) 500 MG tablet Take 1,000 mg by mouth every 6 (six) hours as needed for mild pain or moderate pain.   Yes [provider]  atorvastatin (LIPITOR) 40 MG tablet TAKE 1 TABLET EVERY DAY Patient taking differently: TAKE 1 TABLET BY MOUTH EVERY DAY 10/23/14  Yes Rosalin Hawking, MD  clopidogrel (PLAVIX) 75 MG tablet TAKE 1 TABLET EVERY DAY 10/23/14  Yes Rosalin Hawking, MD  insulin glargine (LANTUS) 100 UNIT/ML injection Inject 42 units into the skin at bedtime. Patient taking differently: Inject 30 Units into the skin at bedtime. Inject 42 units into the skin at bedtime. 02/17/14  Yes Darlyne Russian, MD  lisinopril-hydrochlorothiazide (PRINZIDE,ZESTORETIC) 20-25 MG per tablet Take 1 tablet by mouth daily. PATIENT NEEDS AN OFFICE VISIT FOR ADDITIONAL REFILLS. 10/24/14  Yes Daub, Loura Back, MD  vitamin B-12 (CYANOCOBALAMIN) 1000 MCG tablet Take 1 tablet (1,000 mcg total) by mouth daily. 08/30/13  Yes Rosalin Hawking, MD  blood glucose meter kit and supplies KIT Test blood sugar 3 times daily. Dx code: E11.9 04/22/14   Darlyne Russian, MD  cefUROXime (CEFTIN) 250 MG tablet Take 1 tablet (250 mg total) by mouth 2 (two) times daily with a meal. 07/13/16   Barton Dubois, MD  doxycycline (VIBRAMYCIN) 100 MG capsule Take 1 capsule (100 mg total) by mouth 2 (two) times daily. 07/18/16   Caren Griffins, MD  feeding supplement, ENSURE ENLIVE, (ENSURE  ENLIVE) LIQD Take 237 mLs by mouth daily. Patient not taking: Reported on 07/15/2016 07/13/16   Barton Dubois, MD  glucose blood (RELION GLUCOSE TEST STRIPS) test strip Test blood glucose 3 times a day. Dx Code 250.00 10/16/13   Collene Leyden, PA-C  glucose blood test strip Test blood sugar 3 times daily. Dx code: E11.9 04/22/14   Darlyne Russian, MD  Lancets MISC Test blood sugar 3 times daily. Dx code: E11.9 04/22/14   Darlyne Russian, MD    Family History Family History  Problem Relation Age of Onset  . Stroke Mother   . Hyperlipidemia  Father   . Cancer Brother        type unknown    Social History Social History  Substance Use Topics  . Smoking status: Former Smoker    Packs/day: 0.50    Years: 20.00    Types: Cigarettes    Quit date: 02/15/1975  . Smokeless tobacco: Never Used  . Alcohol use No     Allergies   Adhesive [tape] and Codeine   Review of Systems Review of Systems 10 Systems reviewed and are negative for acute change except as noted in the HPI.   Physical Exam Updated Vital Signs BP (!) 119/54 (BP Location: Right Arm)   Pulse 68   Temp 97.8 F (36.6 C) (Oral)   Resp 18   Ht 6' (1.829 m)   Wt 88.2 kg (194 lb 7.1 oz)   SpO2 100%   BMI 26.37 kg/m   Physical Exam  Constitutional:  Patient is awake and interactive but seems mildly confused and ill. No respiratory distress at rest.  HENT:  Head: Normocephalic and atraumatic.  Eyes: EOM are normal.  Neck: Neck supple.  Cardiovascular: Normal rate, regular rhythm and intact distal pulses.   Pulmonary/Chest: Effort normal and breath sounds normal.  Abdominal: Soft. He exhibits no distension. There is no tenderness. There is no guarding.  Musculoskeletal: He exhibits edema.  Neurological: He is alert.  Patient is alert and somewhat active in helping with examination but clearly confused and requires assistance for sitting forward. Has generalized weakness but no focal extremity flaccidity or decreased motor relative to contralateral side.  Skin: Skin is warm and dry.  Psychiatric:  Patient is cooperative. He is not agitated.     ED Treatments / Results  Labs (all labs ordered are listed, but only abnormal results are displayed) Labs Reviewed  URINE CULTURE - Abnormal; Notable for the following:       Result Value   Culture >=100,000 COLONIES/mL ESCHERICHIA COLI (*)    Organism ID, Bacteria ESCHERICHIA COLI (*)    All other components within normal limits  COMPREHENSIVE METABOLIC PANEL - Abnormal; Notable for the following:     Sodium 130 (*)    Chloride 100 (*)    CO2 21 (*)    Glucose, Bld 161 (*)    Total Protein 5.5 (*)    ALT 11 (*)    Total Bilirubin 2.9 (*)    GFR calc non Af Amer 57 (*)    All other components within normal limits  CBC - Abnormal; Notable for the following:    WBC 52.4 (*)    RBC 3.72 (*)    Hemoglobin 12.4 (*)    HCT 36.4 (*)    Platelets 88 (*)    All other components within normal limits  URINALYSIS, ROUTINE W REFLEX MICROSCOPIC - Abnormal; Notable for the following:    APPearance HAZY (*)  Nitrite POSITIVE (*)    Leukocytes, UA MODERATE (*)    Bacteria, UA MANY (*)    Squamous Epithelial / LPF 0-5 (*)    All other components within normal limits  HEMOGLOBIN A1C - Abnormal; Notable for the following:    Hgb A1c MFr Bld 6.0 (*)    All other components within normal limits  COMPREHENSIVE METABOLIC PANEL - Abnormal; Notable for the following:    Sodium 133 (*)    Potassium 3.4 (*)    Calcium 8.7 (*)    Total Protein 4.9 (*)    Albumin 3.1 (*)    AST 14 (*)    ALT 9 (*)    Total Bilirubin 2.2 (*)    All other components within normal limits  CBC - Abnormal; Notable for the following:    WBC 38.8 (*)    RBC 3.14 (*)    Hemoglobin 10.6 (*)    HCT 30.5 (*)    Platelets 105 (*)    All other components within normal limits  PREALBUMIN - Abnormal; Notable for the following:    Prealbumin 8.6 (*)    All other components within normal limits  GLUCOSE, CAPILLARY - Abnormal; Notable for the following:    Glucose-Capillary 122 (*)    All other components within normal limits  GLUCOSE, CAPILLARY - Abnormal; Notable for the following:    Glucose-Capillary 102 (*)    All other components within normal limits  GLUCOSE, CAPILLARY - Abnormal; Notable for the following:    Glucose-Capillary 106 (*)    All other components within normal limits  GLUCOSE, CAPILLARY - Abnormal; Notable for the following:    Glucose-Capillary 104 (*)    All other components within normal limits    GLUCOSE, CAPILLARY - Abnormal; Notable for the following:    Glucose-Capillary 189 (*)    All other components within normal limits  CBC WITH DIFFERENTIAL/PLATELET - Abnormal; Notable for the following:    WBC 39.1 (*)    RBC 3.33 (*)    Hemoglobin 11.2 (*)    HCT 32.6 (*)    Platelets 98 (*)    Lymphs Abs 33.6 (*)    All other components within normal limits  COMPREHENSIVE METABOLIC PANEL - Abnormal; Notable for the following:    Sodium 134 (*)    Glucose, Bld 106 (*)    BUN 21 (*)    Total Protein 4.9 (*)    Albumin 3.2 (*)    ALT 11 (*)    Total Bilirubin 1.7 (*)    All other components within normal limits  GLUCOSE, CAPILLARY - Abnormal; Notable for the following:    Glucose-Capillary 155 (*)    All other components within normal limits  GLUCOSE, CAPILLARY - Abnormal; Notable for the following:    Glucose-Capillary 133 (*)    All other components within normal limits  GLUCOSE, CAPILLARY - Abnormal; Notable for the following:    Glucose-Capillary 107 (*)    All other components within normal limits  GLUCOSE, CAPILLARY - Abnormal; Notable for the following:    Glucose-Capillary 126 (*)    All other components within normal limits  CBG MONITORING, ED - Abnormal; Notable for the following:    Glucose-Capillary 175 (*)    All other components within normal limits  MAGNESIUM  PHOSPHORUS  TSH  SODIUM, URINE, RANDOM  CREATININE, URINE, RANDOM  OSMOLALITY, URINE  MAGNESIUM  PHOSPHORUS  PATHOLOGIST SMEAR REVIEW  GLUCOSE, CAPILLARY  CBG MONITORING, ED  Randolm Idol, ED  I-STAT CG4 LACTIC ACID, ED    EKG  EKG Interpretation  Date/Time:  Monday Jul 11 2016 18:35:55 EDT Ventricular Rate:  82 PR Interval:    QRS Duration: 154 QT Interval:  420 QTC Calculation: 491 R Axis:   -81 Text Interpretation:  Sinus rhythm Ventricular premature complex Sinus pause RBBB and LAFB agree. some dynamic t waves lateral otherwise no sig change from old Confirmed by Charlesetta Shanks [phone]) on 07/11/2016 6:41:23 PM Also confirmed by Charlesetta Shanks [phone]), editor Verna Czech [phone])  on 07/12/2016 7:36:52 AM       Radiology No results found.  Procedures Procedures (including critical care time)  Medications Ordered in ED Medications  0.9 %  sodium chloride infusion ( Intravenous Stopped 07/12/16 1520)  0.9 %  sodium chloride infusion ( Intravenous Transfusing/Transfer 07/12/16 0003)  cefTRIAXone (ROCEPHIN) 1 g in dextrose 5 % 50 mL IVPB (0 g Intravenous Stopped 07/11/16 2232)  potassium chloride (K-DUR,KLOR-CON) CR tablet 30 mEq (30 mEq Oral Given 07/12/16 2138)  potassium chloride 10 mEq in 100 mL IVPB (0 mEq Intravenous Stopped 07/12/16 1616)  magnesium sulfate IVPB 2 g 50 mL (0 g Intravenous Stopped 07/12/16 1145)     Initial Impression / Assessment and Plan / ED Course  I have reviewed the triage vital signs and the nursing notes.  Pertinent labs & imaging results that were available during my care of the patient were reviewed by me and considered in my medical decision making (see chart for details).    Consult: [facility] hospitalist for admission  Final Clinical Impressions(s) / ED Diagnoses   Final diagnoses:  UTI Confusion  Severe comorbid illness    Patient baseline has CLL. White count is significantly elevated however baseline not available as patient seen at the [place]. Last documented is 40,000 in 2015. Patient is afebrile, no tachycardia and no hypotension. He has had mental status change and is positive for UTI. Plan will be for admission. New Prescriptions Discharge Medication List as of 07/13/2016  1:07 PM    START taking these medications   Details  cefUROXime (CEFTIN) 250 MG tablet Take 1 tablet (250 mg total) by mouth 2 (two) times daily with a meal., Starting Wed 07/13/2016, Print    feeding supplement, ENSURE ENLIVE, (ENSURE ENLIVE) LIQD Take 237 mLs by mouth daily., Starting Wed 07/13/2016, No Print         Charlesetta Shanks,  MD 07/20/16 [phone]

## 2016-07-25 DIAGNOSIS — E119 Type 2 diabetes mellitus without complications: Secondary | ICD-10-CM | POA: Diagnosis not present

## 2016-07-25 DIAGNOSIS — I4430 Unspecified atrioventricular block: Secondary | ICD-10-CM | POA: Diagnosis not present

## 2016-07-25 DIAGNOSIS — N390 Urinary tract infection, site not specified: Secondary | ICD-10-CM | POA: Diagnosis not present

## 2016-07-25 DIAGNOSIS — I10 Essential (primary) hypertension: Secondary | ICD-10-CM | POA: Diagnosis not present

## 2016-07-25 DIAGNOSIS — M6281 Muscle weakness (generalized): Secondary | ICD-10-CM | POA: Diagnosis not present

## 2016-07-25 DIAGNOSIS — L03116 Cellulitis of left lower limb: Secondary | ICD-10-CM | POA: Diagnosis not present

## 2016-07-26 DIAGNOSIS — N390 Urinary tract infection, site not specified: Secondary | ICD-10-CM | POA: Diagnosis not present

## 2016-07-26 DIAGNOSIS — I4430 Unspecified atrioventricular block: Secondary | ICD-10-CM | POA: Diagnosis not present

## 2016-07-26 DIAGNOSIS — E119 Type 2 diabetes mellitus without complications: Secondary | ICD-10-CM | POA: Diagnosis not present

## 2016-07-26 DIAGNOSIS — I10 Essential (primary) hypertension: Secondary | ICD-10-CM | POA: Diagnosis not present

## 2016-07-26 DIAGNOSIS — M6281 Muscle weakness (generalized): Secondary | ICD-10-CM | POA: Diagnosis not present

## 2016-07-26 DIAGNOSIS — L03116 Cellulitis of left lower limb: Secondary | ICD-10-CM | POA: Diagnosis not present

## 2016-07-28 DIAGNOSIS — I10 Essential (primary) hypertension: Secondary | ICD-10-CM | POA: Diagnosis not present

## 2016-07-28 DIAGNOSIS — L03116 Cellulitis of left lower limb: Secondary | ICD-10-CM | POA: Diagnosis not present

## 2016-07-28 DIAGNOSIS — E119 Type 2 diabetes mellitus without complications: Secondary | ICD-10-CM | POA: Diagnosis not present

## 2016-07-28 DIAGNOSIS — I4430 Unspecified atrioventricular block: Secondary | ICD-10-CM | POA: Diagnosis not present

## 2016-07-28 DIAGNOSIS — N390 Urinary tract infection, site not specified: Secondary | ICD-10-CM | POA: Diagnosis not present

## 2016-07-28 DIAGNOSIS — M6281 Muscle weakness (generalized): Secondary | ICD-10-CM | POA: Diagnosis not present

## 2016-08-03 DIAGNOSIS — E119 Type 2 diabetes mellitus without complications: Secondary | ICD-10-CM | POA: Diagnosis not present

## 2016-08-03 DIAGNOSIS — N390 Urinary tract infection, site not specified: Secondary | ICD-10-CM | POA: Diagnosis not present

## 2016-08-03 DIAGNOSIS — L03116 Cellulitis of left lower limb: Secondary | ICD-10-CM | POA: Diagnosis not present

## 2016-08-03 DIAGNOSIS — I4430 Unspecified atrioventricular block: Secondary | ICD-10-CM | POA: Diagnosis not present

## 2016-08-03 DIAGNOSIS — M6281 Muscle weakness (generalized): Secondary | ICD-10-CM | POA: Diagnosis not present

## 2016-08-03 DIAGNOSIS — I10 Essential (primary) hypertension: Secondary | ICD-10-CM | POA: Diagnosis not present

## 2016-08-04 DIAGNOSIS — M6281 Muscle weakness (generalized): Secondary | ICD-10-CM | POA: Diagnosis not present

## 2016-08-04 DIAGNOSIS — N390 Urinary tract infection, site not specified: Secondary | ICD-10-CM | POA: Diagnosis not present

## 2016-08-04 DIAGNOSIS — I4430 Unspecified atrioventricular block: Secondary | ICD-10-CM | POA: Diagnosis not present

## 2016-08-04 DIAGNOSIS — L03116 Cellulitis of left lower limb: Secondary | ICD-10-CM | POA: Diagnosis not present

## 2016-08-04 DIAGNOSIS — I10 Essential (primary) hypertension: Secondary | ICD-10-CM | POA: Diagnosis not present

## 2016-08-04 DIAGNOSIS — E119 Type 2 diabetes mellitus without complications: Secondary | ICD-10-CM | POA: Diagnosis not present

## 2016-08-05 DIAGNOSIS — E119 Type 2 diabetes mellitus without complications: Secondary | ICD-10-CM | POA: Diagnosis not present

## 2016-08-05 DIAGNOSIS — I4430 Unspecified atrioventricular block: Secondary | ICD-10-CM | POA: Diagnosis not present

## 2016-08-05 DIAGNOSIS — N390 Urinary tract infection, site not specified: Secondary | ICD-10-CM | POA: Diagnosis not present

## 2016-08-05 DIAGNOSIS — L03116 Cellulitis of left lower limb: Secondary | ICD-10-CM | POA: Diagnosis not present

## 2016-08-05 DIAGNOSIS — I10 Essential (primary) hypertension: Secondary | ICD-10-CM | POA: Diagnosis not present

## 2016-08-05 DIAGNOSIS — M6281 Muscle weakness (generalized): Secondary | ICD-10-CM | POA: Diagnosis not present

## 2016-08-09 DIAGNOSIS — N390 Urinary tract infection, site not specified: Secondary | ICD-10-CM | POA: Diagnosis not present

## 2016-08-09 DIAGNOSIS — L03116 Cellulitis of left lower limb: Secondary | ICD-10-CM | POA: Diagnosis not present

## 2016-08-09 DIAGNOSIS — I4430 Unspecified atrioventricular block: Secondary | ICD-10-CM | POA: Diagnosis not present

## 2016-08-09 DIAGNOSIS — M6281 Muscle weakness (generalized): Secondary | ICD-10-CM | POA: Diagnosis not present

## 2016-08-09 DIAGNOSIS — I10 Essential (primary) hypertension: Secondary | ICD-10-CM | POA: Diagnosis not present

## 2016-08-09 DIAGNOSIS — E119 Type 2 diabetes mellitus without complications: Secondary | ICD-10-CM | POA: Diagnosis not present

## 2016-08-17 DIAGNOSIS — N390 Urinary tract infection, site not specified: Secondary | ICD-10-CM | POA: Diagnosis not present

## 2016-08-17 DIAGNOSIS — M6281 Muscle weakness (generalized): Secondary | ICD-10-CM | POA: Diagnosis not present

## 2016-08-17 DIAGNOSIS — L03116 Cellulitis of left lower limb: Secondary | ICD-10-CM | POA: Diagnosis not present

## 2016-08-17 DIAGNOSIS — I10 Essential (primary) hypertension: Secondary | ICD-10-CM | POA: Diagnosis not present

## 2016-08-17 DIAGNOSIS — I4430 Unspecified atrioventricular block: Secondary | ICD-10-CM | POA: Diagnosis not present

## 2016-08-17 DIAGNOSIS — E119 Type 2 diabetes mellitus without complications: Secondary | ICD-10-CM | POA: Diagnosis not present

## 2016-08-24 DIAGNOSIS — I10 Essential (primary) hypertension: Secondary | ICD-10-CM | POA: Diagnosis not present

## 2016-08-24 DIAGNOSIS — I4430 Unspecified atrioventricular block: Secondary | ICD-10-CM | POA: Diagnosis not present

## 2016-08-24 DIAGNOSIS — N390 Urinary tract infection, site not specified: Secondary | ICD-10-CM | POA: Diagnosis not present

## 2016-08-24 DIAGNOSIS — L03116 Cellulitis of left lower limb: Secondary | ICD-10-CM | POA: Diagnosis not present

## 2016-08-24 DIAGNOSIS — M6281 Muscle weakness (generalized): Secondary | ICD-10-CM | POA: Diagnosis not present

## 2016-08-24 DIAGNOSIS — E119 Type 2 diabetes mellitus without complications: Secondary | ICD-10-CM | POA: Diagnosis not present

## 2016-08-31 DIAGNOSIS — L570 Actinic keratosis: Secondary | ICD-10-CM | POA: Diagnosis not present

## 2016-09-01 DIAGNOSIS — N390 Urinary tract infection, site not specified: Secondary | ICD-10-CM | POA: Diagnosis not present

## 2016-09-01 DIAGNOSIS — L03116 Cellulitis of left lower limb: Secondary | ICD-10-CM | POA: Diagnosis not present

## 2016-09-01 DIAGNOSIS — E119 Type 2 diabetes mellitus without complications: Secondary | ICD-10-CM | POA: Diagnosis not present

## 2016-09-01 DIAGNOSIS — I4430 Unspecified atrioventricular block: Secondary | ICD-10-CM | POA: Diagnosis not present

## 2016-09-01 DIAGNOSIS — M6281 Muscle weakness (generalized): Secondary | ICD-10-CM | POA: Diagnosis not present

## 2016-09-01 DIAGNOSIS — I10 Essential (primary) hypertension: Secondary | ICD-10-CM | POA: Diagnosis not present

## 2016-09-07 DIAGNOSIS — L03116 Cellulitis of left lower limb: Secondary | ICD-10-CM | POA: Diagnosis not present

## 2016-09-07 DIAGNOSIS — I4430 Unspecified atrioventricular block: Secondary | ICD-10-CM | POA: Diagnosis not present

## 2016-09-07 DIAGNOSIS — M6281 Muscle weakness (generalized): Secondary | ICD-10-CM | POA: Diagnosis not present

## 2016-09-07 DIAGNOSIS — I10 Essential (primary) hypertension: Secondary | ICD-10-CM | POA: Diagnosis not present

## 2016-09-07 DIAGNOSIS — E119 Type 2 diabetes mellitus without complications: Secondary | ICD-10-CM | POA: Diagnosis not present

## 2016-09-07 DIAGNOSIS — N390 Urinary tract infection, site not specified: Secondary | ICD-10-CM | POA: Diagnosis not present

## 2016-09-09 DIAGNOSIS — L570 Actinic keratosis: Secondary | ICD-10-CM | POA: Diagnosis not present

## 2016-09-09 DIAGNOSIS — L821 Other seborrheic keratosis: Secondary | ICD-10-CM | POA: Diagnosis not present

## 2016-09-09 DIAGNOSIS — D485 Neoplasm of uncertain behavior of skin: Secondary | ICD-10-CM | POA: Diagnosis not present

## 2016-09-09 DIAGNOSIS — D225 Melanocytic nevi of trunk: Secondary | ICD-10-CM | POA: Diagnosis not present

## 2016-09-14 DIAGNOSIS — M6281 Muscle weakness (generalized): Secondary | ICD-10-CM | POA: Diagnosis not present

## 2016-09-14 DIAGNOSIS — N390 Urinary tract infection, site not specified: Secondary | ICD-10-CM | POA: Diagnosis not present

## 2016-09-14 DIAGNOSIS — I10 Essential (primary) hypertension: Secondary | ICD-10-CM | POA: Diagnosis not present

## 2016-09-14 DIAGNOSIS — I4430 Unspecified atrioventricular block: Secondary | ICD-10-CM | POA: Diagnosis not present

## 2016-09-14 DIAGNOSIS — L03116 Cellulitis of left lower limb: Secondary | ICD-10-CM | POA: Diagnosis not present

## 2016-09-14 DIAGNOSIS — E119 Type 2 diabetes mellitus without complications: Secondary | ICD-10-CM | POA: Diagnosis not present

## 2016-09-21 DIAGNOSIS — I4430 Unspecified atrioventricular block: Secondary | ICD-10-CM | POA: Diagnosis not present

## 2016-09-21 DIAGNOSIS — I10 Essential (primary) hypertension: Secondary | ICD-10-CM | POA: Diagnosis not present

## 2016-09-21 DIAGNOSIS — L03116 Cellulitis of left lower limb: Secondary | ICD-10-CM | POA: Diagnosis not present

## 2016-09-21 DIAGNOSIS — M6281 Muscle weakness (generalized): Secondary | ICD-10-CM | POA: Diagnosis not present

## 2016-09-21 DIAGNOSIS — N390 Urinary tract infection, site not specified: Secondary | ICD-10-CM | POA: Diagnosis not present

## 2016-09-21 DIAGNOSIS — E119 Type 2 diabetes mellitus without complications: Secondary | ICD-10-CM | POA: Diagnosis not present

## 2016-10-03 DIAGNOSIS — E119 Type 2 diabetes mellitus without complications: Secondary | ICD-10-CM | POA: Diagnosis not present

## 2016-10-03 DIAGNOSIS — I4430 Unspecified atrioventricular block: Secondary | ICD-10-CM | POA: Diagnosis not present

## 2016-10-03 DIAGNOSIS — I10 Essential (primary) hypertension: Secondary | ICD-10-CM | POA: Diagnosis not present

## 2016-10-03 DIAGNOSIS — M6281 Muscle weakness (generalized): Secondary | ICD-10-CM | POA: Diagnosis not present

## 2016-10-03 DIAGNOSIS — N390 Urinary tract infection, site not specified: Secondary | ICD-10-CM | POA: Diagnosis not present

## 2016-10-03 DIAGNOSIS — L03116 Cellulitis of left lower limb: Secondary | ICD-10-CM | POA: Diagnosis not present

## 2016-10-07 DIAGNOSIS — N390 Urinary tract infection, site not specified: Secondary | ICD-10-CM | POA: Diagnosis not present

## 2016-10-07 DIAGNOSIS — I4430 Unspecified atrioventricular block: Secondary | ICD-10-CM | POA: Diagnosis not present

## 2016-10-07 DIAGNOSIS — M6281 Muscle weakness (generalized): Secondary | ICD-10-CM | POA: Diagnosis not present

## 2016-10-07 DIAGNOSIS — I10 Essential (primary) hypertension: Secondary | ICD-10-CM | POA: Diagnosis not present

## 2016-10-07 DIAGNOSIS — E119 Type 2 diabetes mellitus without complications: Secondary | ICD-10-CM | POA: Diagnosis not present

## 2016-10-07 DIAGNOSIS — L03116 Cellulitis of left lower limb: Secondary | ICD-10-CM | POA: Diagnosis not present

## 2016-10-10 DIAGNOSIS — E119 Type 2 diabetes mellitus without complications: Secondary | ICD-10-CM | POA: Diagnosis not present

## 2016-10-10 DIAGNOSIS — L03116 Cellulitis of left lower limb: Secondary | ICD-10-CM | POA: Diagnosis not present

## 2016-10-10 DIAGNOSIS — I4430 Unspecified atrioventricular block: Secondary | ICD-10-CM | POA: Diagnosis not present

## 2016-10-10 DIAGNOSIS — I10 Essential (primary) hypertension: Secondary | ICD-10-CM | POA: Diagnosis not present

## 2016-10-10 DIAGNOSIS — N390 Urinary tract infection, site not specified: Secondary | ICD-10-CM | POA: Diagnosis not present

## 2016-10-10 DIAGNOSIS — M6281 Muscle weakness (generalized): Secondary | ICD-10-CM | POA: Diagnosis not present

## 2016-10-14 DIAGNOSIS — N390 Urinary tract infection, site not specified: Secondary | ICD-10-CM | POA: Diagnosis not present

## 2016-10-14 DIAGNOSIS — M6281 Muscle weakness (generalized): Secondary | ICD-10-CM | POA: Diagnosis not present

## 2016-10-14 DIAGNOSIS — L03116 Cellulitis of left lower limb: Secondary | ICD-10-CM | POA: Diagnosis not present

## 2016-10-14 DIAGNOSIS — I4430 Unspecified atrioventricular block: Secondary | ICD-10-CM | POA: Diagnosis not present

## 2016-10-14 DIAGNOSIS — E119 Type 2 diabetes mellitus without complications: Secondary | ICD-10-CM | POA: Diagnosis not present

## 2016-10-14 DIAGNOSIS — I10 Essential (primary) hypertension: Secondary | ICD-10-CM | POA: Diagnosis not present

## 2016-10-17 DIAGNOSIS — I10 Essential (primary) hypertension: Secondary | ICD-10-CM | POA: Diagnosis not present

## 2016-10-17 DIAGNOSIS — I4430 Unspecified atrioventricular block: Secondary | ICD-10-CM | POA: Diagnosis not present

## 2016-10-17 DIAGNOSIS — M6281 Muscle weakness (generalized): Secondary | ICD-10-CM | POA: Diagnosis not present

## 2016-10-17 DIAGNOSIS — N390 Urinary tract infection, site not specified: Secondary | ICD-10-CM | POA: Diagnosis not present

## 2016-10-17 DIAGNOSIS — L03116 Cellulitis of left lower limb: Secondary | ICD-10-CM | POA: Diagnosis not present

## 2016-10-17 DIAGNOSIS — E119 Type 2 diabetes mellitus without complications: Secondary | ICD-10-CM | POA: Diagnosis not present

## 2016-10-20 DIAGNOSIS — E119 Type 2 diabetes mellitus without complications: Secondary | ICD-10-CM | POA: Diagnosis not present

## 2016-10-20 DIAGNOSIS — I4430 Unspecified atrioventricular block: Secondary | ICD-10-CM | POA: Diagnosis not present

## 2016-10-20 DIAGNOSIS — M6281 Muscle weakness (generalized): Secondary | ICD-10-CM | POA: Diagnosis not present

## 2016-10-20 DIAGNOSIS — I10 Essential (primary) hypertension: Secondary | ICD-10-CM | POA: Diagnosis not present

## 2016-10-20 DIAGNOSIS — N390 Urinary tract infection, site not specified: Secondary | ICD-10-CM | POA: Diagnosis not present

## 2016-10-20 DIAGNOSIS — L03116 Cellulitis of left lower limb: Secondary | ICD-10-CM | POA: Diagnosis not present

## 2016-10-24 DIAGNOSIS — E119 Type 2 diabetes mellitus without complications: Secondary | ICD-10-CM | POA: Diagnosis not present

## 2016-10-24 DIAGNOSIS — N390 Urinary tract infection, site not specified: Secondary | ICD-10-CM | POA: Diagnosis not present

## 2016-10-24 DIAGNOSIS — M6281 Muscle weakness (generalized): Secondary | ICD-10-CM | POA: Diagnosis not present

## 2016-10-24 DIAGNOSIS — I10 Essential (primary) hypertension: Secondary | ICD-10-CM | POA: Diagnosis not present

## 2016-10-24 DIAGNOSIS — L03116 Cellulitis of left lower limb: Secondary | ICD-10-CM | POA: Diagnosis not present

## 2016-10-24 DIAGNOSIS — I4430 Unspecified atrioventricular block: Secondary | ICD-10-CM | POA: Diagnosis not present

## 2016-10-28 DIAGNOSIS — N390 Urinary tract infection, site not specified: Secondary | ICD-10-CM | POA: Diagnosis not present

## 2016-10-28 DIAGNOSIS — M6281 Muscle weakness (generalized): Secondary | ICD-10-CM | POA: Diagnosis not present

## 2016-10-28 DIAGNOSIS — E119 Type 2 diabetes mellitus without complications: Secondary | ICD-10-CM | POA: Diagnosis not present

## 2016-10-28 DIAGNOSIS — I10 Essential (primary) hypertension: Secondary | ICD-10-CM | POA: Diagnosis not present

## 2016-10-28 DIAGNOSIS — I4430 Unspecified atrioventricular block: Secondary | ICD-10-CM | POA: Diagnosis not present

## 2016-10-28 DIAGNOSIS — L03116 Cellulitis of left lower limb: Secondary | ICD-10-CM | POA: Diagnosis not present

## 2016-10-31 DIAGNOSIS — E119 Type 2 diabetes mellitus without complications: Secondary | ICD-10-CM | POA: Diagnosis not present

## 2016-10-31 DIAGNOSIS — M6281 Muscle weakness (generalized): Secondary | ICD-10-CM | POA: Diagnosis not present

## 2016-10-31 DIAGNOSIS — I10 Essential (primary) hypertension: Secondary | ICD-10-CM | POA: Diagnosis not present

## 2016-10-31 DIAGNOSIS — N390 Urinary tract infection, site not specified: Secondary | ICD-10-CM | POA: Diagnosis not present

## 2016-10-31 DIAGNOSIS — I4430 Unspecified atrioventricular block: Secondary | ICD-10-CM | POA: Diagnosis not present

## 2016-10-31 DIAGNOSIS — L03116 Cellulitis of left lower limb: Secondary | ICD-10-CM | POA: Diagnosis not present

## 2016-11-05 DIAGNOSIS — M6281 Muscle weakness (generalized): Secondary | ICD-10-CM | POA: Diagnosis not present

## 2016-11-05 DIAGNOSIS — I10 Essential (primary) hypertension: Secondary | ICD-10-CM | POA: Diagnosis not present

## 2016-11-05 DIAGNOSIS — L03116 Cellulitis of left lower limb: Secondary | ICD-10-CM | POA: Diagnosis not present

## 2016-11-05 DIAGNOSIS — N390 Urinary tract infection, site not specified: Secondary | ICD-10-CM | POA: Diagnosis not present

## 2016-11-05 DIAGNOSIS — I4430 Unspecified atrioventricular block: Secondary | ICD-10-CM | POA: Diagnosis not present

## 2016-11-05 DIAGNOSIS — E119 Type 2 diabetes mellitus without complications: Secondary | ICD-10-CM | POA: Diagnosis not present

## 2016-11-08 DIAGNOSIS — M6281 Muscle weakness (generalized): Secondary | ICD-10-CM | POA: Diagnosis not present

## 2016-11-08 DIAGNOSIS — I10 Essential (primary) hypertension: Secondary | ICD-10-CM | POA: Diagnosis not present

## 2016-11-08 DIAGNOSIS — L03116 Cellulitis of left lower limb: Secondary | ICD-10-CM | POA: Diagnosis not present

## 2016-11-08 DIAGNOSIS — N390 Urinary tract infection, site not specified: Secondary | ICD-10-CM | POA: Diagnosis not present

## 2016-11-08 DIAGNOSIS — I4430 Unspecified atrioventricular block: Secondary | ICD-10-CM | POA: Diagnosis not present

## 2016-11-08 DIAGNOSIS — E119 Type 2 diabetes mellitus without complications: Secondary | ICD-10-CM | POA: Diagnosis not present

## 2016-11-11 DIAGNOSIS — I10 Essential (primary) hypertension: Secondary | ICD-10-CM | POA: Diagnosis not present

## 2016-11-11 DIAGNOSIS — N390 Urinary tract infection, site not specified: Secondary | ICD-10-CM | POA: Diagnosis not present

## 2016-11-11 DIAGNOSIS — I4430 Unspecified atrioventricular block: Secondary | ICD-10-CM | POA: Diagnosis not present

## 2016-11-11 DIAGNOSIS — E119 Type 2 diabetes mellitus without complications: Secondary | ICD-10-CM | POA: Diagnosis not present

## 2016-11-11 DIAGNOSIS — L03116 Cellulitis of left lower limb: Secondary | ICD-10-CM | POA: Diagnosis not present

## 2016-11-11 DIAGNOSIS — M6281 Muscle weakness (generalized): Secondary | ICD-10-CM | POA: Diagnosis not present

## 2016-11-14 DIAGNOSIS — E119 Type 2 diabetes mellitus without complications: Secondary | ICD-10-CM | POA: Diagnosis not present

## 2016-11-14 DIAGNOSIS — N390 Urinary tract infection, site not specified: Secondary | ICD-10-CM | POA: Diagnosis not present

## 2016-11-14 DIAGNOSIS — I10 Essential (primary) hypertension: Secondary | ICD-10-CM | POA: Diagnosis not present

## 2016-11-14 DIAGNOSIS — I4430 Unspecified atrioventricular block: Secondary | ICD-10-CM | POA: Diagnosis not present

## 2016-11-14 DIAGNOSIS — M6281 Muscle weakness (generalized): Secondary | ICD-10-CM | POA: Diagnosis not present

## 2016-11-14 DIAGNOSIS — L03116 Cellulitis of left lower limb: Secondary | ICD-10-CM | POA: Diagnosis not present

## 2016-11-17 DIAGNOSIS — I10 Essential (primary) hypertension: Secondary | ICD-10-CM | POA: Diagnosis not present

## 2016-11-17 DIAGNOSIS — M1991 Primary osteoarthritis, unspecified site: Secondary | ICD-10-CM | POA: Diagnosis not present

## 2016-11-17 DIAGNOSIS — L03116 Cellulitis of left lower limb: Secondary | ICD-10-CM | POA: Diagnosis not present

## 2016-11-17 DIAGNOSIS — C9110 Chronic lymphocytic leukemia of B-cell type not having achieved remission: Secondary | ICD-10-CM | POA: Diagnosis not present

## 2016-11-17 DIAGNOSIS — I4430 Unspecified atrioventricular block: Secondary | ICD-10-CM | POA: Diagnosis not present

## 2016-11-17 DIAGNOSIS — E119 Type 2 diabetes mellitus without complications: Secondary | ICD-10-CM | POA: Diagnosis not present

## 2016-11-22 DIAGNOSIS — L03116 Cellulitis of left lower limb: Secondary | ICD-10-CM | POA: Diagnosis not present

## 2016-11-22 DIAGNOSIS — C9110 Chronic lymphocytic leukemia of B-cell type not having achieved remission: Secondary | ICD-10-CM | POA: Diagnosis not present

## 2016-11-22 DIAGNOSIS — M1991 Primary osteoarthritis, unspecified site: Secondary | ICD-10-CM | POA: Diagnosis not present

## 2016-11-22 DIAGNOSIS — I4430 Unspecified atrioventricular block: Secondary | ICD-10-CM | POA: Diagnosis not present

## 2016-11-22 DIAGNOSIS — E119 Type 2 diabetes mellitus without complications: Secondary | ICD-10-CM | POA: Diagnosis not present

## 2016-11-22 DIAGNOSIS — I10 Essential (primary) hypertension: Secondary | ICD-10-CM | POA: Diagnosis not present

## 2016-11-24 DIAGNOSIS — C9110 Chronic lymphocytic leukemia of B-cell type not having achieved remission: Secondary | ICD-10-CM | POA: Diagnosis not present

## 2016-11-24 DIAGNOSIS — L03116 Cellulitis of left lower limb: Secondary | ICD-10-CM | POA: Diagnosis not present

## 2016-11-24 DIAGNOSIS — I10 Essential (primary) hypertension: Secondary | ICD-10-CM | POA: Diagnosis not present

## 2016-11-24 DIAGNOSIS — E119 Type 2 diabetes mellitus without complications: Secondary | ICD-10-CM | POA: Diagnosis not present

## 2016-11-24 DIAGNOSIS — M1991 Primary osteoarthritis, unspecified site: Secondary | ICD-10-CM | POA: Diagnosis not present

## 2016-11-24 DIAGNOSIS — I4430 Unspecified atrioventricular block: Secondary | ICD-10-CM | POA: Diagnosis not present

## 2017-02-13 DIAGNOSIS — C61 Malignant neoplasm of prostate: Secondary | ICD-10-CM | POA: Diagnosis not present

## 2017-02-17 DIAGNOSIS — R9721 Rising PSA following treatment for malignant neoplasm of prostate: Secondary | ICD-10-CM | POA: Diagnosis not present

## 2017-02-17 DIAGNOSIS — C61 Malignant neoplasm of prostate: Secondary | ICD-10-CM | POA: Diagnosis not present

## 2017-06-02 ENCOUNTER — Emergency Department (HOSPITAL_COMMUNITY): Payer: Medicare HMO

## 2017-06-02 ENCOUNTER — Inpatient Hospital Stay (HOSPITAL_COMMUNITY)
Admission: EM | Admit: 2017-06-02 | Discharge: 2017-06-04 | DRG: 432 | Disposition: A | Discharge status: Home Health | Payer: Medicare HMO | Attending: Internal Medicine | Admitting: Internal Medicine

## 2017-06-02 ENCOUNTER — Encounter (HOSPITAL_COMMUNITY): Payer: Self-pay | Admitting: Emergency Medicine

## 2017-06-02 DIAGNOSIS — I130 Hypertensive heart and chronic kidney disease with heart failure and stage 1 through stage 4 chronic kidney disease, or unspecified chronic kidney disease: Secondary | ICD-10-CM | POA: Diagnosis present

## 2017-06-02 DIAGNOSIS — K7460 Unspecified cirrhosis of liver: Principal | ICD-10-CM | POA: Diagnosis present

## 2017-06-02 DIAGNOSIS — Z885 Allergy status to narcotic agent status: Secondary | ICD-10-CM

## 2017-06-02 DIAGNOSIS — R188 Other ascites: Secondary | ICD-10-CM

## 2017-06-02 DIAGNOSIS — C9110 Chronic lymphocytic leukemia of B-cell type not having achieved remission: Secondary | ICD-10-CM | POA: Diagnosis not present

## 2017-06-02 DIAGNOSIS — N390 Urinary tract infection, site not specified: Secondary | ICD-10-CM | POA: Diagnosis not present

## 2017-06-02 DIAGNOSIS — I509 Heart failure, unspecified: Secondary | ICD-10-CM | POA: Diagnosis present

## 2017-06-02 DIAGNOSIS — K8020 Calculus of gallbladder without cholecystitis without obstruction: Secondary | ICD-10-CM | POA: Diagnosis not present

## 2017-06-02 DIAGNOSIS — E875 Hyperkalemia: Secondary | ICD-10-CM | POA: Diagnosis present

## 2017-06-02 DIAGNOSIS — J918 Pleural effusion in other conditions classified elsewhere: Secondary | ICD-10-CM | POA: Diagnosis not present

## 2017-06-02 DIAGNOSIS — Z95820 Peripheral vascular angioplasty status with implants and grafts: Secondary | ICD-10-CM | POA: Diagnosis not present

## 2017-06-02 DIAGNOSIS — J9811 Atelectasis: Secondary | ICD-10-CM | POA: Diagnosis not present

## 2017-06-02 DIAGNOSIS — E785 Hyperlipidemia, unspecified: Secondary | ICD-10-CM | POA: Diagnosis present

## 2017-06-02 DIAGNOSIS — M1990 Unspecified osteoarthritis, unspecified site: Secondary | ICD-10-CM | POA: Diagnosis present

## 2017-06-02 DIAGNOSIS — E1122 Type 2 diabetes mellitus with diabetic chronic kidney disease: Secondary | ICD-10-CM | POA: Diagnosis present

## 2017-06-02 DIAGNOSIS — Z87891 Personal history of nicotine dependence: Secondary | ICD-10-CM

## 2017-06-02 DIAGNOSIS — I8510 Secondary esophageal varices without bleeding: Secondary | ICD-10-CM | POA: Diagnosis not present

## 2017-06-02 DIAGNOSIS — Z8673 Personal history of transient ischemic attack (TIA), and cerebral infarction without residual deficits: Secondary | ICD-10-CM

## 2017-06-02 DIAGNOSIS — R627 Adult failure to thrive: Secondary | ICD-10-CM | POA: Diagnosis present

## 2017-06-02 DIAGNOSIS — Z950 Presence of cardiac pacemaker: Secondary | ICD-10-CM

## 2017-06-02 DIAGNOSIS — Z66 Do not resuscitate: Secondary | ICD-10-CM | POA: Diagnosis present

## 2017-06-02 DIAGNOSIS — B953 Streptococcus pneumoniae as the cause of diseases classified elsewhere: Secondary | ICD-10-CM | POA: Diagnosis present

## 2017-06-02 DIAGNOSIS — R531 Weakness: Secondary | ICD-10-CM | POA: Diagnosis not present

## 2017-06-02 DIAGNOSIS — Z8546 Personal history of malignant neoplasm of prostate: Secondary | ICD-10-CM | POA: Diagnosis not present

## 2017-06-02 DIAGNOSIS — Z9079 Acquired absence of other genital organ(s): Secondary | ICD-10-CM | POA: Diagnosis not present

## 2017-06-02 DIAGNOSIS — R339 Retention of urine, unspecified: Secondary | ICD-10-CM | POA: Diagnosis present

## 2017-06-02 DIAGNOSIS — J189 Pneumonia, unspecified organism: Secondary | ICD-10-CM | POA: Diagnosis not present

## 2017-06-02 DIAGNOSIS — J181 Lobar pneumonia, unspecified organism: Secondary | ICD-10-CM | POA: Diagnosis present

## 2017-06-02 DIAGNOSIS — E119 Type 2 diabetes mellitus without complications: Secondary | ICD-10-CM

## 2017-06-02 DIAGNOSIS — D539 Nutritional anemia, unspecified: Secondary | ICD-10-CM | POA: Diagnosis not present

## 2017-06-02 DIAGNOSIS — N182 Chronic kidney disease, stage 2 (mild): Secondary | ICD-10-CM | POA: Diagnosis present

## 2017-06-02 DIAGNOSIS — C9190 Lymphoid leukemia, unspecified not having achieved remission: Secondary | ICD-10-CM | POA: Diagnosis not present

## 2017-06-02 DIAGNOSIS — Z7902 Long term (current) use of antithrombotics/antiplatelets: Secondary | ICD-10-CM

## 2017-06-02 DIAGNOSIS — Z91048 Other nonmedicinal substance allergy status: Secondary | ICD-10-CM

## 2017-06-02 DIAGNOSIS — Z794 Long term (current) use of insulin: Secondary | ICD-10-CM | POA: Diagnosis not present

## 2017-06-02 DIAGNOSIS — R609 Edema, unspecified: Secondary | ICD-10-CM | POA: Diagnosis not present

## 2017-06-02 DIAGNOSIS — Z79899 Other long term (current) drug therapy: Secondary | ICD-10-CM

## 2017-06-02 LAB — URINALYSIS, ROUTINE W REFLEX MICROSCOPIC
Bilirubin Urine: NEGATIVE
GLUCOSE, UA: NEGATIVE mg/dL
Ketones, ur: NEGATIVE mg/dL
LEUKOCYTES UA: NEGATIVE
NITRITE: NEGATIVE
PH: 5 (ref 5.0–8.0)
Protein, ur: 100 mg/dL — AB
SPECIFIC GRAVITY, URINE: 1.015 (ref 1.005–1.030)

## 2017-06-02 LAB — CBC WITH DIFFERENTIAL/PLATELET
BASOS PCT: 0 %
Basophils Absolute: 0 10*3/uL (ref 0.0–0.1)
EOS ABS: 0 10*3/uL (ref 0.0–0.7)
Eosinophils Relative: 0 %
HCT: 38.9 % — ABNORMAL LOW (ref 39.0–52.0)
Hemoglobin: 12.6 g/dL — ABNORMAL LOW (ref 13.0–17.0)
Lymphocytes Relative: 88 %
Lymphs Abs: 40.1 10*3/uL — ABNORMAL HIGH (ref 0.7–4.0)
MCH: 32.6 pg (ref 26.0–34.0)
MCHC: 32.4 g/dL (ref 30.0–36.0)
MCV: 100.5 fL — AB (ref 78.0–100.0)
MONO ABS: 0.5 10*3/uL (ref 0.1–1.0)
Monocytes Relative: 1 %
NEUTROS ABS: 5 10*3/uL (ref 1.7–7.7)
Neutrophils Relative %: 11 %
PLATELETS: 133 10*3/uL — AB (ref 150–400)
RBC: 3.87 MIL/uL — ABNORMAL LOW (ref 4.22–5.81)
RDW: 15.2 % (ref 11.5–15.5)
WBC: 45.6 10*3/uL — ABNORMAL HIGH (ref 4.0–10.5)

## 2017-06-02 LAB — I-STAT CHEM 8, ED
BUN: 38 mg/dL — ABNORMAL HIGH (ref 6–20)
CALCIUM ION: 1.07 mmol/L — AB (ref 1.15–1.40)
Chloride: 101 mmol/L (ref 101–111)
Creatinine, Ser: 1.2 mg/dL (ref 0.61–1.24)
GLUCOSE: 124 mg/dL — AB (ref 65–99)
HCT: 38 % — ABNORMAL LOW (ref 39.0–52.0)
HEMOGLOBIN: 12.9 g/dL — AB (ref 13.0–17.0)
Potassium: 6.1 mmol/L — ABNORMAL HIGH (ref 3.5–5.1)
Sodium: 134 mmol/L — ABNORMAL LOW (ref 135–145)
TCO2: 26 mmol/L (ref 22–32)

## 2017-06-02 LAB — COMPREHENSIVE METABOLIC PANEL
ALT: 11 U/L — ABNORMAL LOW (ref 17–63)
ANION GAP: 12 (ref 5–15)
AST: 21 U/L (ref 15–41)
Albumin: 3.8 g/dL (ref 3.5–5.0)
Alkaline Phosphatase: 86 U/L (ref 38–126)
BUN: 28 mg/dL — ABNORMAL HIGH (ref 6–20)
CALCIUM: 9.3 mg/dL (ref 8.9–10.3)
CHLORIDE: 104 mmol/L (ref 101–111)
CO2: 23 mmol/L (ref 22–32)
Creatinine, Ser: 1.22 mg/dL (ref 0.61–1.24)
GFR calc non Af Amer: 52 mL/min — ABNORMAL LOW (ref >60)
Glucose, Bld: 133 mg/dL — ABNORMAL HIGH (ref 65–99)
Potassium: 4.6 mmol/L (ref 3.5–5.1)
SODIUM: 139 mmol/L (ref 135–145)
Total Bilirubin: 2.3 mg/dL — ABNORMAL HIGH (ref 0.3–1.2)
Total Protein: 6.3 g/dL — ABNORMAL LOW (ref 6.5–8.1)

## 2017-06-02 LAB — I-STAT CG4 LACTIC ACID, ED: Lactic Acid, Venous: 1.31 mmol/L (ref 0.5–1.9)

## 2017-06-02 LAB — I-STAT TROPONIN, ED: TROPONIN I, POC: 0.02 ng/mL (ref 0.00–0.08)

## 2017-06-02 LAB — ETHANOL: Alcohol, Ethyl (B): <10 mg/dL (ref <10)

## 2017-06-02 LAB — CBG MONITORING, ED: GLUCOSE-CAPILLARY: 79 mg/dL (ref 65–99)

## 2017-06-02 MED ORDER — SODIUM CHLORIDE 0.9 % IV SOLN
1.0000 g | Freq: Once | INTRAVENOUS | Status: AC
  Administered 2017-06-02: 1 g via INTRAVENOUS
  Filled 2017-06-02: qty 10

## 2017-06-02 MED ORDER — IOPAMIDOL (ISOVUE-300) INJECTION 61%
INTRAVENOUS | Status: AC
  Filled 2017-06-02: qty 100

## 2017-06-02 MED ORDER — SODIUM CHLORIDE 0.9 % IV SOLN
500.0000 mg | Freq: Once | INTRAVENOUS | Status: AC
  Administered 2017-06-02: 500 mg via INTRAVENOUS
  Filled 2017-06-02: qty 500

## 2017-06-02 MED ORDER — SODIUM CHLORIDE 0.9 % IV BOLUS: 1000.0000 mL | Freq: Once | INTRAVENOUS | Status: DC

## 2017-06-02 MED ORDER — IOPAMIDOL (ISOVUE-300) INJECTION 61%
100.0000 mL | Freq: Once | INTRAVENOUS | Status: AC | PRN
  Administered 2017-06-02: 100 mL via INTRAVENOUS

## 2017-06-02 NOTE — ED Triage Notes (Signed)
[facility] was [facility] [place] [facility] for urine retention. Had foley cath placed and only had 110ml urine returned. Removed foley and placed another catheter with no output. Flushed foley and no urine returned. Pt had bladder scan which showed >600. Foley cath still in place. Family states that his CBG was 63 this morning and hasnt eaten since due to "being at doctors appointments".

## 2017-06-02 NOTE — H&P (Addendum)
TRH H&P   Patient Demographics:    Brandon Chambers, is a 84 y.o. male  MRN: [id number]   DOB - 1928/05/29  Admit Date - 06/02/2017  Outpatient Primary MD for the patient is Borum, Jaci Standard, MD  Referring MD/NP/PA: Shirlyn Goltz  Outpatient Specialists:     Patient coming from: home=> [facility]  Chief Complaint  Patient presents with  . Urinary Retention      HPI:    Brandon Chambers  is a 84 y.o. male, w CLL , Dm2, hypertension, hyperlipidemia, CVA, prostate cancer apparently c/o generalized weakness, and went to routine [facility] appointment and told them that he was having difficulty urinating and had a ?in and out cath w very little output and was told to go to ER for evaluation. Pt notes slight dry cough, denies fever, chills, cp, palp, sob, n/v, diarrhea, brbpr.   In ED, CXR IMPRESSION: Right lower lobe infiltrate   CT abd/ pelvis IMPRESSION: 1. The over arching finding is that of cirrhosis with large volume of ascites and soft tissue anasarca. Ancillary findings of small paraesophageal varices. No splenomegaly. No hepatic mass. 2. Uncomplicated cholelithiasis. 3. Foley decompressed urinary bladder. 4. Status post prostatectomy.  Na 139, K 4.6, Bun 28, Creatinine 1.22 Ast 21, Alt 11, Alk phos 86, T. Bili 2.3 Glucose 133  Wbc 45.6 (baseline 30) Hgb 12.6, Plt 133 Etoh <10  Urinalysis wbc 6-30, Rbc tntc  Trop 0.02  Pt will be admitted for RLL pneumonia and cirrhosis and ascites.        Review of systems:    In addition to the HPI above,  No Fever-chills, No Headache, No changes with Vision or hearing, No problems swallowing food or Liquids, No Chest pain, No Shortness of Breath, No Abdominal pain, No Nausea or Vommitting, Bowel movements are regular, No Blood in stool or Urine, No dysuria, No new skin rashes or bruises, No new joints pains-aches,  No  new weakness, tingling, numbness in any extremity, No recent weight gain or loss, No polyuria, polydypsia or polyphagia, No significant Mental Stressors.  A full 10 point Review of Systems was done, except as stated above, all other Review of Systems were negative.   With Past History of the following :    Past Medical History:  Diagnosis Date  . Arthritis   . CLL (chronic lymphocytic leukemia) ([place])   . Diabetes mellitus ([place])   . Hypercholesteremia   . Hypertension   . Prostate cancer ([place])   . Skin cancer (melanoma) ([place])   . SOB (shortness of breath)   . Stroke [facility])       Past Surgical History:  Procedure Laterality Date  . BYPASS GRAFT Left    leg  . CATARACT EXTRACTION Bilateral   . PROSTATE SURGERY        Social History:     Social History   Tobacco Use  .  Smoking status: Former Smoker    Packs/day: 0.50    Years: 20.00    Pack years: 10.00    Types: Cigarettes    Last attempt to quit: 02/15/1975    Years since quitting: 42.3  . Smokeless tobacco: Never Used  Substance Use Topics  . Alcohol use: No     Lives - at home  Mobility - walks by self   Family History :     Family History  Problem Relation Age of Onset  . Stroke Mother   . Hyperlipidemia Father   . Cancer Brother        type unknown       Home Medications:   Prior to Admission medications   Medication Sig Start Date End Date Taking? Authorizing Provider  acetaminophen (TYLENOL) 500 MG tablet Take 1,000 mg by mouth every 6 (six) hours as needed for mild pain or moderate pain.   Yes [provider]  atorvastatin (LIPITOR) 40 MG tablet TAKE 1 TABLET EVERY DAY Patient taking differently: TAKE 1 TABLET BY MOUTH EVERY DAY 10/23/14  Yes Rosalin Hawking, MD  blood glucose meter kit and supplies KIT Test blood sugar 3 times daily. Dx code: E11.9 04/22/14  Yes Darlyne Russian, MD  clopidogrel (PLAVIX) 75 MG tablet TAKE 1 TABLET EVERY DAY 10/23/14  Yes Rosalin Hawking, MD  furosemide (LASIX)  20 MG tablet Take 20 mg by mouth daily.   Yes [provider]  glucose blood (RELION GLUCOSE TEST STRIPS) test strip Test blood glucose 3 times a day. Dx Code 250.00 10/16/13  Yes Marte, Heather M, PA-C  glucose blood test strip Test blood sugar 3 times daily. Dx code: E11.9 04/22/14  Yes Darlyne Russian, MD  Lancets MISC Test blood sugar 3 times daily. Dx code: E11.9 04/22/14  Yes Darlyne Russian, MD  lisinopril (PRINIVIL,ZESTRIL) 20 MG tablet Take 20 mg by mouth daily.   Yes [provider]  vitamin B-12 (CYANOCOBALAMIN) 1000 MCG tablet Take 1 tablet (1,000 mcg total) by mouth daily. 08/30/13  Yes Rosalin Hawking, MD  feeding supplement, ENSURE ENLIVE, (ENSURE ENLIVE) LIQD Take 237 mLs by mouth daily. Patient not taking: Reported on 06/02/2017 07/13/16   Barton Dubois, MD  insulin glargine (LANTUS) 100 UNIT/ML injection Inject 42 units into the skin at bedtime. Patient not taking: Reported on 06/02/2017 02/17/14   Darlyne Russian, MD  lisinopril-hydrochlorothiazide (PRINZIDE,ZESTORETIC) 20-25 MG per tablet Take 1 tablet by mouth daily. PATIENT NEEDS AN OFFICE VISIT FOR ADDITIONAL REFILLS. Patient not taking: Reported on 06/02/2017 10/24/14   Darlyne Russian, MD     Allergies:     Allergies  Allergen Reactions  . Adhesive [Tape] Rash  . Codeine Rash     Physical Exam:   Vitals  Blood pressure 135/76, pulse 85, temperature 97.6 F (36.4 C), temperature source Oral, resp. rate 19, SpO2 99 %.   1. General  lying in bed in NAD,   2. Normal affect and insight, Not Suicidal or Homicidal, Awake Alert, Oriented X 3.  3. No F.N deficits, ALL C.Nerves Intact, Strength 5/5 all 4 extremities, Sensation intact all 4 extremities, Plantars down going.  4. Ears and Eyes appear Normal, Conjunctivae clear, PERRLA. Moist Oral Mucosa.  5. Supple Neck, No JVD, No cervical lymphadenopathy appriciated, No Carotid Bruits.  6. Symmetrical Chest wall movement, Good air movement bilaterally, slight  crackles right lower lung, no wheezing  7. RRR, No Gallops, Rubs or Murmurs, No Parasternal Heave.  8. Slight  abdominal distension. , Positive Bowel Sounds, Abdomen Soft, No tenderness, No organomegaly appriciated,No rebound -guarding or rigidity.  9.  No Cyanosis, No Skin Rash or Bruise.1 + edema  10. Good muscle tone,  joints appear normal , no effusions, Normal ROM.  11. No Palpable Lymph Nodes in Neck or Axillae  No palmar erythema, no asterixis   Data Review:    CBC Recent Labs  Lab 06/02/17 2048 06/02/17 2130  WBC 45.6*  --   HGB 12.6* 12.9*  HCT 38.9* 38.0*  PLT 133*  --   MCV 100.5*  --   MCH 32.6  --   MCHC 32.4  --   RDW 15.2  --   LYMPHSABS 40.1*  --   MONOABS 0.5  --   EOSABS 0.0  --   BASOSABS 0.0  --    ------------------------------------------------------------------------------------------------------------------  Chemistries  Recent Labs  Lab 06/02/17 2048 06/02/17 2130  NA 139 134*  K 4.6 6.1*  CL 104 101  CO2 23  --   GLUCOSE 133* 124*  BUN 28* 38*  CREATININE 1.22 1.20  CALCIUM 9.3  --   AST 21  --   ALT 11*  --   ALKPHOS 86  --   BILITOT 2.3*  --    ------------------------------------------------------------------------------------------------------------------ CrCl cannot be calculated (Unknown ideal weight.). ------------------------------------------------------------------------------------------------------------------ No results for input(s): TSH, T4TOTAL, T3FREE, THYROIDAB in the last 72 hours.  Invalid input(s): FREET3  Coagulation profile No results for input(s): INR, PROTIME in the last 168 hours. ------------------------------------------------------------------------------------------------------------------- No results for input(s): DDIMER in the last 72 hours. -------------------------------------------------------------------------------------------------------------------  Cardiac Enzymes No results for  input(s): CKMB, TROPONINI, MYOGLOBIN in the last 168 hours.  Invalid input(s): CK ------------------------------------------------------------------------------------------------------------------ No results found for: BNP   ---------------------------------------------------------------------------------------------------------------  Urinalysis    Component Value Date/Time   COLORURINE YELLOW 06/02/2017 2108   APPEARANCEUR HAZY (A) 06/02/2017 2108   LABSPEC 1.015 06/02/2017 2108   PHURINE 5.0 06/02/2017 2108   GLUCOSEU NEGATIVE 06/02/2017 2108   HGBUR LARGE (A) 06/02/2017 2108   BILIRUBINUR NEGATIVE 06/02/2017 2108   BILIRUBINUR Negative 01/30/2014 1201   KETONESUR NEGATIVE 06/02/2017 2108   PROTEINUR 100 (A) 06/02/2017 2108   UROBILINOGEN 1.0 01/30/2014 1201   UROBILINOGEN 0.2 01/22/2008 0851   NITRITE NEGATIVE 06/02/2017 2108   LEUKOCYTESUR NEGATIVE 06/02/2017 2108    ----------------------------------------------------------------------------------------------------------------   Imaging Results:    Dg Chest 2 View  Result Date: 06/02/2017 CLINICAL DATA:  Weakness EXAM: CHEST - 2 VIEW COMPARISON:  07/15/2016 FINDINGS: Cardiac shadow is stable. Two lead pacing device is now seen. No pneumothorax is noted. The overall inspiratory effort is poor. Increased density is noted in the right lung base which projects in the right lower lobe consistent with focal infiltrate. No sizable effusion is seen. No acute bony abnormality is noted. IMPRESSION: Right lower lobe infiltrate Electronically Signed   By: Inez Catalina M.D.   On: 06/02/2017 21:36   Ct Abdomen Pelvis W Contrast  Result Date: 06/02/2017 CLINICAL DATA:  Urinary retention with Foley catheter placement and only 100 cc of urine return. EXAM: CT ABDOMEN AND PELVIS WITH CONTRAST TECHNIQUE: Multidetector CT imaging of the abdomen and pelvis was performed using the standard protocol following bolus administration of intravenous  contrast. CONTRAST:  168m ISOVUE-300 IOPAMIDOL (ISOVUE-300) INJECTION 61% COMPARISON:  Abdomen radiographs 12/28/2005 FINDINGS: Lower chest: Mild cardiomegaly with coron moderate left main and three-vessel coronary arteriosclerosis. No pericardial effusion. Small to moderate right pleural effusion with adjacent atelectasis. Mild peribronchial thickening in both lower lobes.  Hepatobiliary: Small shrunken liver with nodular liver surface contour consistent with cirrhosis. Calcified granulomata are present within. Numerous gallstones are seen within the nondistended gallbladder along the dependent aspect. No biliary dilatation. Pancreas: Atrophic pancreas. Spleen: No splenomegaly. Adrenals/Urinary Tract: Normal bilateral adrenal glands and kidneys. No hydronephrosis. The urinary bladder is difficult to visualize due to a large volume of surrounding ascites. What appears to represent the bladder appears decompressed with Foley catheter noted within. Stomach/Bowel: Nonobstructed, nondistended bowel loops, displaced centrally due to large volume of ascites. The appendix is normal in caliber. Vascular/Lymphatic: Moderate-to-marked aortoiliac and branch vessel atherosclerosis without aneurysm. No lymphadenopathy. There appear to be small paraesophageal varices. Reproductive: Status post prostatectomy with surgical clips along the pelvic sidewall bilaterally. Other: Large volume of ascites with third spacing of fluid. Musculoskeletal: No aggressive osseous lesions. IMPRESSION: 1. The over arching finding is that of cirrhosis with large volume of ascites and soft tissue anasarca. Ancillary findings of small paraesophageal varices. No splenomegaly. No hepatic mass. 2. Uncomplicated cholelithiasis. 3. Foley decompressed urinary bladder. 4. Status post prostatectomy. Electronically Signed   By: Ashley Royalty M.D.   On: 06/02/2017 23:22     Assessment & Plan:    Principal Problem:   Pneumonia Active Problems:   Diabetes  mellitus (HCC)   CLL (chronic lymphocytic leukemia) ([place])   Acute lower UTI    CAP Blood culture x2 Urine strep antigen Urine legionella antigen Rocephin 1gm iv qday, Zithromax '500mg'$  iv qday  Leukocytosis Likely secondary to CAP on CLL Check cbc in am  UTI Await urine culture tx with Rocephin as above  Cirrhosis w ascites Awaiting INR Ultrasound guided Paracentesis Cont lasix '20mg'$  po qday  Dm2 fsbs ac and qhs, ISS  Hypertension Cont lisniopril Check cmp in am  Hyperlipidemia Cont lipitor  CVA Cont plavix   DVT Prophylaxis - SCDs   AM Labs Ordered, also please review Full Orders  Family Communication: Admission, patients condition and plan of care including tests being ordered have been discussed with the patient  who indicate understanding and agree with the plan and Code Status.  Code Status FULL CODE  Likely DC to  home  Condition GUARDED    Consults called: none  Admission status: inpatient   Time spent in minutes : 45   Jani Gravel M.D on 06/02/2017 at 11:57 PM  Between 7am to 7pm - Pager - [phone]   After 7pm go to [email or web address] - password [email or web address]  [facility] - Office  [phone]

## 2017-06-02 NOTE — ED Provider Notes (Signed)
[place] DEPT Provider Note   CSN: [id number] Arrival date & time: 06/02/17  1653     History   Chief Complaint Chief Complaint  Patient presents with  . Urinary Retention    HPI Brandon Chambers is a 84 y.o. male history of CLL, HTN, DM, HL, here presenting with lower abdominal pain, distention, trouble urinating, possible retention.  Patient states that he has progressive abdominal distention for the last several weeks.  He has trouble urinating for several weeks has progressively getting worse.  He also has some cough and shortness of breath as well.  He finally decided to tell his son who brought him to the [place] for evaluation.  At the [facility] clinic, patient was thought to have urinary retention so a Foley was placed but minimal output.  They performed a bladder scan which showed > 600 cc so he was sent for evaluation. No hx of prostate cancer or BPH. Patient is a former alcoholic but has no known hx of cirrhosis.   The history is provided by the patient.    Past Medical History:  Diagnosis Date  . Arthritis   . CLL (chronic lymphocytic leukemia) ([place])   . Diabetes mellitus ([place])   . Hypercholesteremia   . Hypertension   . Prostate cancer ([place])   . Skin cancer (melanoma) ([place])   . SOB (shortness of breath)   . Stroke [facility])     Patient Active Problem List   Diagnosis Date Noted  . Pneumonia 06/02/2017  . Cellulitis of left leg 07/15/2016  . UTI (urinary tract infection) 07/12/2016  . Acute lower UTI 07/11/2016  . Hyponatremia 07/11/2016  . Acute metabolic encephalopathy 14/43/1540  . AKI (acute kidney injury) ([place]) 07/11/2016  . Dehydration 07/11/2016  . Thrombocytopenia ([place]) 07/11/2016  . QT prolongation 07/11/2016  . HLD (hyperlipidemia) 12/02/2013  . B12 deficiency 12/02/2013  . Prostate cancer ([place]) 09/04/2012  . CLL (chronic lymphocytic leukemia) ([place]) 03/05/2012  . Anticoagulated on Coumadin 03/05/2012  . CVA (cerebral vascular  accident) ([place]) 03/05/2012  . AV block 10/21/2011  . Diabetes mellitus ([place])   . Hypercholesteremia   . Hypertension   . SOB (shortness of breath)     Past Surgical History:  Procedure Laterality Date  . BYPASS GRAFT Left    leg  . CATARACT EXTRACTION Bilateral   . PROSTATE SURGERY          Home Medications    Prior to Admission medications   Medication Sig Start Date End Date Taking? Authorizing Provider  acetaminophen (TYLENOL) 500 MG tablet Take 1,000 mg by mouth every 6 (six) hours as needed for mild pain or moderate pain.    [provider]  atorvastatin (LIPITOR) 40 MG tablet TAKE 1 TABLET EVERY DAY Patient taking differently: TAKE 1 TABLET BY MOUTH EVERY DAY 10/23/14   Rosalin Hawking, MD  blood glucose meter kit and supplies KIT Test blood sugar 3 times daily. Dx code: E11.9 04/22/14   Darlyne Russian, MD  clopidogrel (PLAVIX) 75 MG tablet TAKE 1 TABLET EVERY DAY 10/23/14   Rosalin Hawking, MD  feeding supplement, ENSURE ENLIVE, (ENSURE ENLIVE) LIQD Take 237 mLs by mouth daily. Patient not taking: Reported on 07/15/2016 07/13/16   Barton Dubois, MD  glucose blood (RELION GLUCOSE TEST STRIPS) test strip Test blood glucose 3 times a day. Dx Code 250.00 10/16/13   Collene Leyden, PA-C  glucose blood test strip Test blood sugar 3 times daily. Dx code: E11.9 04/22/14  Darlyne Russian, MD  insulin glargine (LANTUS) 100 UNIT/ML injection Inject 42 units into the skin at bedtime. Patient not taking: Reported on 06/02/2017 02/17/14   Darlyne Russian, MD  Lancets MISC Test blood sugar 3 times daily. Dx code: E11.9 04/22/14   Darlyne Russian, MD  lisinopril-hydrochlorothiazide (PRINZIDE,ZESTORETIC) 20-25 MG per tablet Take 1 tablet by mouth daily. PATIENT NEEDS AN OFFICE VISIT FOR ADDITIONAL REFILLS. 10/24/14   Darlyne Russian, MD  vitamin B-12 (CYANOCOBALAMIN) 1000 MCG tablet Take 1 tablet (1,000 mcg total) by mouth daily. 08/30/13   Rosalin Hawking, MD    Family History Family History  Problem  Relation Age of Onset  . Stroke Mother   . Hyperlipidemia Father   . Cancer Brother        type unknown    Social History Social History   Tobacco Use  . Smoking status: Former Smoker    Packs/day: 0.50    Years: 20.00    Pack years: 10.00    Types: Cigarettes    Last attempt to quit: 02/15/1975    Years since quitting: 42.3  . Smokeless tobacco: Never Used  Substance Use Topics  . Alcohol use: No  . Drug use: No     Allergies   Adhesive [tape] and Codeine   Review of Systems Review of Systems  Gastrointestinal: Positive for abdominal distention and abdominal pain.  Genitourinary: Positive for difficulty urinating.  All other systems reviewed and are negative.    Physical Exam Updated Vital Signs BP 135/76 (BP Location: Left Arm)   Pulse 85   Temp 97.6 F (36.4 C) (Oral)   Resp 19   SpO2 99%   Physical Exam  Constitutional:  Chronically ill   HENT:  Head: Normocephalic.  Eyes: Pupils are equal, round, and reactive to light. Conjunctivae and EOM are normal.  Neck: Normal range of motion. Neck supple.  Cardiovascular: Normal rate, regular rhythm and normal heart sounds.  Pulmonary/Chest: Effort normal.  Crackles R lung base   Abdominal:  Distended, + fluid wave, nontender   Musculoskeletal:  1+ edema bilaterally   Neurological: He is alert.  Skin: Skin is warm.  Psychiatric: He has a normal mood and affect.  Nursing note and vitals reviewed.    ED Treatments / Results  Labs (all labs ordered are listed, but only abnormal results are displayed) Labs Reviewed  URINALYSIS, ROUTINE W REFLEX MICROSCOPIC - Abnormal; Notable for the following components:      Result Value   APPearance HAZY (*)    Hgb urine dipstick LARGE (*)    Protein, ur 100 (*)    Bacteria, UA RARE (*)    Squamous Epithelial / LPF 0-5 (*)    All other components within normal limits  CBC WITH DIFFERENTIAL/PLATELET - Abnormal; Notable for the following components:   WBC 45.6 (*)     RBC 3.87 (*)    Hemoglobin 12.6 (*)    HCT 38.9 (*)    MCV 100.5 (*)    Platelets 133 (*)    Lymphs Abs 40.1 (*)    All other components within normal limits  COMPREHENSIVE METABOLIC PANEL - Abnormal; Notable for the following components:   Glucose, Bld 133 (*)    BUN 28 (*)    Total Protein 6.3 (*)    ALT 11 (*)    Total Bilirubin 2.3 (*)    GFR calc non Af Amer 52 (*)    All other components within normal limits  I-STAT  CHEM 8, ED - Abnormal; Notable for the following components:   Sodium 134 (*)    Potassium 6.1 (*)    BUN 38 (*)    Glucose, Bld 124 (*)    Calcium, Ion 1.07 (*)    Hemoglobin 12.9 (*)    HCT 38.0 (*)    All other components within normal limits  URINE CULTURE  CULTURE, BLOOD (ROUTINE X 2)  CULTURE, BLOOD (ROUTINE X 2)  ETHANOL  BRAIN NATRIURETIC PEPTIDE  PROTIME-INR  CBG MONITORING, ED  I-STAT TROPONIN, ED  I-STAT CG4 LACTIC ACID, ED    EKG EKG Interpretation  Date/Time:  Friday June 02 2017 21:03:25 EDT Ventricular Rate:  87 PR Interval:    QRS Duration: 197 QT Interval:  469 QTC Calculation: 453 R Axis:   -97 Text Interpretation:  Ventricular-paced complexes No further analysis attempted due to paced rhythm No significant change since last tracing Confirmed by Wandra Arthurs [phone]) on 06/02/2017 9:12:53 PM   Radiology Dg Chest 2 View  Result Date: 06/02/2017 CLINICAL DATA:  Weakness EXAM: CHEST - 2 VIEW COMPARISON:  07/15/2016 FINDINGS: Cardiac shadow is stable. Two lead pacing device is now seen. No pneumothorax is noted. The overall inspiratory effort is poor. Increased density is noted in the right lung base which projects in the right lower lobe consistent with focal infiltrate. No sizable effusion is seen. No acute bony abnormality is noted. IMPRESSION: Right lower lobe infiltrate Electronically Signed   By: Inez Catalina M.D.   On: 06/02/2017 21:36   Ct Abdomen Pelvis W Contrast  Result Date: 06/02/2017 CLINICAL DATA:  Urinary  retention with Foley catheter placement and only 100 cc of urine return. EXAM: CT ABDOMEN AND PELVIS WITH CONTRAST TECHNIQUE: Multidetector CT imaging of the abdomen and pelvis was performed using the standard protocol following bolus administration of intravenous contrast. CONTRAST:  177m ISOVUE-300 IOPAMIDOL (ISOVUE-300) INJECTION 61% COMPARISON:  Abdomen radiographs 12/28/2005 FINDINGS: Lower chest: Mild cardiomegaly with coron moderate left main and three-vessel coronary arteriosclerosis. No pericardial effusion. Small to moderate right pleural effusion with adjacent atelectasis. Mild peribronchial thickening in both lower lobes. Hepatobiliary: Small shrunken liver with nodular liver surface contour consistent with cirrhosis. Calcified granulomata are present within. Numerous gallstones are seen within the nondistended gallbladder along the dependent aspect. No biliary dilatation. Pancreas: Atrophic pancreas. Spleen: No splenomegaly. Adrenals/Urinary Tract: Normal bilateral adrenal glands and kidneys. No hydronephrosis. The urinary bladder is difficult to visualize due to a large volume of surrounding ascites. What appears to represent the bladder appears decompressed with Foley catheter noted within. Stomach/Bowel: Nonobstructed, nondistended bowel loops, displaced centrally due to large volume of ascites. The appendix is normal in caliber. Vascular/Lymphatic: Moderate-to-marked aortoiliac and branch vessel atherosclerosis without aneurysm. No lymphadenopathy. There appear to be small paraesophageal varices. Reproductive: Status post prostatectomy with surgical clips along the pelvic sidewall bilaterally. Other: Large volume of ascites with third spacing of fluid. Musculoskeletal: No aggressive osseous lesions. IMPRESSION: 1. The over arching finding is that of cirrhosis with large volume of ascites and soft tissue anasarca. Ancillary findings of small paraesophageal varices. No splenomegaly. No hepatic mass.  2. Uncomplicated cholelithiasis. 3. Foley decompressed urinary bladder. 4. Status post prostatectomy. Electronically Signed   By: DAshley RoyaltyM.D.   On: 06/02/2017 23:22    Procedures Procedures (including critical care time)  Medications Ordered in ED Medications  azithromycin (ZITHROMAX) 500 mg in sodium chloride 0.9 % 250 mL IVPB (500 mg Intravenous New Bag/Given 06/02/17 2327)  iopamidol (ISOVUE-300) 61 %  injection (has no administration in time range)  iopamidol (ISOVUE-300) 61 % injection 100 mL (100 mLs Intravenous Contrast Given 06/02/17 2239)  cefTRIAXone (ROCEPHIN) 1 g in sodium chloride 0.9 % 100 mL IVPB (0 g Intravenous Stopped 06/02/17 2304)     Initial Impression / Assessment and Plan / ED Course  I have reviewed the triage vital signs and the nursing notes.  Pertinent labs & imaging results that were available during my care of the patient were reviewed by me and considered in my medical decision making (see chart for details).     Brandon Chambers is a 84 y.o. male here with abdominal distention, possible retention. Bedside US showed free fluid throughout the abdomen but foley seemed in place. I think the bladder scan was likely false positive in the setting of ascites. He has leg swelling as well so consider CHF vs liver failure vs renal failure. Will get labs, CXR, CT ab/pel, UA.   11:41 PM LFTs nl. CXR showed RLL pneumonia. WBC 45, elevated compared to baseline (hx of CLL). Lactate normal. Given rocephin, azithromycin. CT showed ascites. Will admit for CAP, paracentesis for workup for ascites. I doubt SBP currently.    Final Clinical Impressions(s) / ED Diagnoses   Final diagnoses:  None    ED Discharge Orders    None       Drenda Freeze, MD 06/02/17 2341

## 2017-06-03 ENCOUNTER — Other Ambulatory Visit: Payer: Self-pay

## 2017-06-03 ENCOUNTER — Inpatient Hospital Stay (HOSPITAL_COMMUNITY): Payer: Medicare HMO

## 2017-06-03 DIAGNOSIS — J189 Pneumonia, unspecified organism: Secondary | ICD-10-CM | POA: Diagnosis present

## 2017-06-03 DIAGNOSIS — R188 Other ascites: Secondary | ICD-10-CM | POA: Insufficient documentation

## 2017-06-03 DIAGNOSIS — J181 Lobar pneumonia, unspecified organism: Secondary | ICD-10-CM

## 2017-06-03 LAB — BODY FLUID CELL COUNT WITH DIFFERENTIAL
Eos, Fluid: 0 %
LYMPHS FL: 89 %
Monocyte-Macrophage-Serous Fluid: 9 % — ABNORMAL LOW (ref 50–90)
NEUTROPHIL FLUID: 2 % (ref 0–25)
Total Nucleated Cell Count, Fluid: 335 cu mm (ref 0–1000)

## 2017-06-03 LAB — ALBUMIN, PLEURAL OR PERITONEAL FLUID: Albumin, Fluid: 1.9 g/dL

## 2017-06-03 LAB — GRAM STAIN: Gram Stain: NONE SEEN

## 2017-06-03 LAB — PROTEIN, PLEURAL OR PERITONEAL FLUID

## 2017-06-03 LAB — HEMOGLOBIN A1C
Hgb A1c MFr Bld: 5 % (ref 4.8–5.6)
Mean Plasma Glucose: 96.8 mg/dL

## 2017-06-03 LAB — BASIC METABOLIC PANEL
ANION GAP: 8 (ref 5–15)
BUN: 26 mg/dL — ABNORMAL HIGH (ref 6–20)
CO2: 24 mmol/L (ref 22–32)
Calcium: 8.9 mg/dL (ref 8.9–10.3)
Chloride: 107 mmol/L (ref 101–111)
Creatinine, Ser: 1.08 mg/dL (ref 0.61–1.24)
GFR calc Af Amer: >60 mL/min (ref >60)
GLUCOSE: 120 mg/dL — AB (ref 65–99)
POTASSIUM: 4.1 mmol/L (ref 3.5–5.1)
Sodium: 139 mmol/L (ref 135–145)

## 2017-06-03 LAB — GLUCOSE, CAPILLARY
GLUCOSE-CAPILLARY: 136 mg/dL — AB (ref 65–99)
GLUCOSE-CAPILLARY: 147 mg/dL — AB (ref 65–99)
Glucose-Capillary: 140 mg/dL — ABNORMAL HIGH (ref 65–99)

## 2017-06-03 LAB — STREP PNEUMONIAE URINARY ANTIGEN: Strep Pneumo Urinary Antigen: POSITIVE — AB

## 2017-06-03 LAB — HIV ANTIBODY (ROUTINE TESTING W REFLEX): HIV Screen 4th Generation wRfx: NONREACTIVE

## 2017-06-03 LAB — PROTIME-INR
INR: 1.12
Prothrombin Time: 14.3 seconds (ref 11.4–15.2)

## 2017-06-03 LAB — BRAIN NATRIURETIC PEPTIDE: B NATRIURETIC PEPTIDE 5: 223.1 pg/mL — AB (ref 0.0–100.0)

## 2017-06-03 MED ORDER — SODIUM CHLORIDE 0.9% FLUSH: 3.0000 mL | INTRAVENOUS | Status: DC | PRN

## 2017-06-03 MED ORDER — VITAMIN B-12 1000 MCG PO TABS
1000.0000 ug | ORAL_TABLET | Freq: Every day | ORAL | Status: DC
  Administered 2017-06-03 – 2017-06-04 (×2): 1000 ug via ORAL
  Filled 2017-06-03 (×2): qty 1

## 2017-06-03 MED ORDER — SODIUM CHLORIDE 0.9 % IV SOLN: 500.0000 mg | INTRAVENOUS | Status: DC

## 2017-06-03 MED ORDER — ATORVASTATIN CALCIUM 40 MG PO TABS
40.0000 mg | ORAL_TABLET | Freq: Every day | ORAL | Status: DC
  Administered 2017-06-03: 40 mg via ORAL
  Filled 2017-06-03: qty 1

## 2017-06-03 MED ORDER — SODIUM CHLORIDE 0.9% FLUSH
3.0000 mL | Freq: Two times a day (BID) | INTRAVENOUS | Status: DC
  Administered 2017-06-03 – 2017-06-04 (×2): 3 mL via INTRAVENOUS

## 2017-06-03 MED ORDER — PROPRANOLOL HCL 20 MG PO TABS
10.0000 mg | ORAL_TABLET | Freq: Two times a day (BID) | ORAL | Status: DC
  Administered 2017-06-03 – 2017-06-04 (×2): 10 mg via ORAL
  Filled 2017-06-03 (×2): qty 1

## 2017-06-03 MED ORDER — INSULIN ASPART 100 UNIT/ML ~~LOC~~ SOLN: 0.0000 [IU] | Freq: Three times a day (TID) | SUBCUTANEOUS | Status: DC

## 2017-06-03 MED ORDER — SACCHAROMYCES BOULARDII 250 MG PO CAPS
250.0000 mg | ORAL_CAPSULE | Freq: Two times a day (BID) | ORAL | Status: DC
  Administered 2017-06-03 – 2017-06-04 (×3): 250 mg via ORAL
  Filled 2017-06-03 (×3): qty 1

## 2017-06-03 MED ORDER — LIDOCAINE HCL 1 % IJ SOLN
INTRAMUSCULAR | Status: AC
  Filled 2017-06-03: qty 20

## 2017-06-03 MED ORDER — AMOXICILLIN-POT CLAVULANATE 500-125 MG PO TABS
1.0000 | ORAL_TABLET | Freq: Two times a day (BID) | ORAL | Status: DC
  Administered 2017-06-03 – 2017-06-04 (×2): 500 mg via ORAL
  Filled 2017-06-03 (×2): qty 1

## 2017-06-03 MED ORDER — SPIRONOLACTONE 25 MG PO TABS
50.0000 mg | ORAL_TABLET | Freq: Every day | ORAL | Status: DC
  Administered 2017-06-03 – 2017-06-04 (×2): 50 mg via ORAL
  Filled 2017-06-03 (×2): qty 2

## 2017-06-03 MED ORDER — FUROSEMIDE 20 MG PO TABS
20.0000 mg | ORAL_TABLET | Freq: Every day | ORAL | Status: DC
  Administered 2017-06-03 – 2017-06-04 (×2): 20 mg via ORAL
  Filled 2017-06-03 (×2): qty 1

## 2017-06-03 MED ORDER — CLOPIDOGREL BISULFATE 75 MG PO TABS
75.0000 mg | ORAL_TABLET | Freq: Every day | ORAL | Status: DC
  Administered 2017-06-03 – 2017-06-04 (×2): 75 mg via ORAL
  Filled 2017-06-03 (×2): qty 1

## 2017-06-03 MED ORDER — SODIUM CHLORIDE 0.9 % IV SOLN
1.0000 g | INTRAVENOUS | Status: DC
  Filled 2017-06-03: qty 10

## 2017-06-03 MED ORDER — LISINOPRIL 20 MG PO TABS: 20.0000 mg | ORAL_TABLET | Freq: Every day | ORAL | Status: DC

## 2017-06-03 MED ORDER — SODIUM CHLORIDE 0.9 % IV SOLN: 250.0000 mL | INTRAVENOUS | Status: DC | PRN

## 2017-06-03 MED ORDER — INSULIN ASPART 100 UNIT/ML ~~LOC~~ SOLN: 0.0000 [IU] | Freq: Every day | SUBCUTANEOUS | Status: DC

## 2017-06-03 NOTE — ED Notes (Signed)
ED TO INPATIENT HANDOFF REPORT  Name/Age/Gender Brandon Chambers 84 y.o. male  Code Status Code Status History    Date Active Date Inactive Code Status Order ID Comments User Context   07/15/2016 2031 07/18/2016 1601 Full Code [id number]  Ivor Costa, MD ED   07/12/2016 0019 07/13/2016 1625 Full Code [id number]  Toy Baker, MD Inpatient      Home/SNF/Other Home  Chief Complaint Urinary Retention  Level of Care/Admitting Diagnosis ED Disposition    ED Disposition Condition [facility]: [facility] [528413]  Level of Care: Telemetry [5]  Admit to tele based on following criteria: Monitor for Ischemic changes  Diagnosis: Pneumonia [227785]  Admitting Physician: Jani Gravel [[id number]]  Attending Physician: Jani Gravel [phone]  Estimated length of stay: past midnight tomorrow  Certification:: I certify this patient will need inpatient services for at least 2 midnights  PT Class (Do Not Modify): Inpatient [101]  PT Acc Code (Do Not Modify): Private [1]       Medical History Past Medical History:  Diagnosis Date  . Arthritis   . CLL (chronic lymphocytic leukemia) ([place])   . Diabetes mellitus ([place])   . Hypercholesteremia   . Hypertension   . Prostate cancer ([place])   . Skin cancer (melanoma) ([place])   . SOB (shortness of breath)   . Stroke [facility])     Allergies Allergies  Allergen Reactions  . Adhesive [Tape] Rash  . Codeine Rash    IV Location/Drains/Wounds Patient Lines/Drains/Airways Status   Active Line/Drains/Airways    Name:   Placement date:   Placement time:   Site:   Days:   Peripheral IV 06/02/17 Left Antecubital   06/02/17    2115    Antecubital   1          Labs/Imaging Results for orders placed or performed during the hospital encounter of 06/02/17 (from the past 48 hour(s))  CBG monitoring, ED     Status: None   Collection Time: 06/02/17  5:19 PM  Result Value Ref Range   Glucose-Capillary 79 65 - 99 mg/dL  CBC  with Differential/Platelet     Status: Abnormal   Collection Time: 06/02/17  8:48 PM  Result Value Ref Range   WBC 45.6 (H) 4.0 - 10.5 K/uL   RBC 3.87 (L) 4.22 - 5.81 MIL/uL   Hemoglobin 12.6 (L) 13.0 - 17.0 g/dL   HCT 38.9 (L) 39.0 - 52.0 %   MCV 100.5 (H) 78.0 - 100.0 fL   MCH 32.6 26.0 - 34.0 pg   MCHC 32.4 30.0 - 36.0 g/dL   RDW 15.2 11.5 - 15.5 %   Platelets 133 (L) 150 - 400 K/uL   Neutrophils Relative % 11 %   Lymphocytes Relative 88 %   Monocytes Relative 1 %   Eosinophils Relative 0 %   Basophils Relative 0 %   Neutro Abs 5.0 1.7 - 7.7 K/uL   Lymphs Abs 40.1 (H) 0.7 - 4.0 K/uL   Monocytes Absolute 0.5 0.1 - 1.0 K/uL   Eosinophils Absolute 0.0 0.0 - 0.7 K/uL   Basophils Absolute 0.0 0.0 - 0.1 K/uL   RBC Morphology BURR CELLS    WBC Morphology SMUDGE CELLS     Comment: Performed at [facility], [place] [address] [place], [place] [address]  Comprehensive metabolic panel     Status: Abnormal   Collection Time: 06/02/17  8:48 PM  Result Value Ref Range   Sodium 139  135 - 145 mmol/L   Potassium 4.6 3.5 - 5.1 mmol/L   Chloride 104 101 - 111 mmol/L   CO2 23 22 - 32 mmol/L   Glucose, Bld 133 (H) 65 - 99 mg/dL   BUN 28 (H) 6 - 20 mg/dL   Creatinine, Ser 1.22 0.61 - 1.24 mg/dL   Calcium 9.3 8.9 - 10.3 mg/dL   Total Protein 6.3 (L) 6.5 - 8.1 g/dL   Albumin 3.8 3.5 - 5.0 g/dL   AST 21 15 - 41 U/L   ALT 11 (L) 17 - 63 U/L   Alkaline Phosphatase 86 38 - 126 U/L   Total Bilirubin 2.3 (H) 0.3 - 1.2 mg/dL   GFR calc non Af Amer 52 (L) >60 mL/min   GFR calc Af Amer >60 >60 mL/min    Comment: (NOTE) The eGFR has been calculated using the CKD EPI equation. This calculation has not been validated in all clinical situations. eGFR's persistently <60 mL/min signify possible Chronic Kidney Disease.    Anion gap 12 5 - 15    Comment: Performed at [facility], [place] [address] [place], [place] [address]  Ethanol     Status: None   Collection  Time: 06/02/17  8:53 PM  Result Value Ref Range   Alcohol, Ethyl (B) <10 <10 mg/dL    Comment:        LOWEST DETECTABLE LIMIT FOR SERUM ALCOHOL IS 10 mg/dL FOR MEDICAL PURPOSES ONLY Performed at [facility], [place] [address], [place], [place] [address]   Brain natriuretic peptide     Status: Abnormal   Collection Time: 06/02/17  8:54 PM  Result Value Ref Range   B Natriuretic Peptide 223.1 (H) 0.0 - 100.0 pg/mL    Comment: Performed at [facility], [place] [address], [place], [place] [address]  Urinalysis, Routine w reflex microscopic- may I&O cath if menses     Status: Abnormal   Collection Time: 06/02/17  9:08 PM  Result Value Ref Range   Color, Urine YELLOW YELLOW   APPearance HAZY (A) CLEAR   Specific Gravity, Urine 1.015 1.005 - 1.030   pH 5.0 5.0 - 8.0   Glucose, UA NEGATIVE NEGATIVE mg/dL   Hgb urine dipstick LARGE (A) NEGATIVE   Bilirubin Urine NEGATIVE NEGATIVE   Ketones, ur NEGATIVE NEGATIVE mg/dL   Protein, ur 100 (A) NEGATIVE mg/dL   Nitrite NEGATIVE NEGATIVE   Leukocytes, UA NEGATIVE NEGATIVE   RBC / HPF TOO NUMEROUS TO COUNT 0 - 5 RBC/hpf   WBC, UA 6-30 0 - 5 WBC/hpf   Bacteria, UA RARE (A) NONE SEEN   Squamous Epithelial / LPF 0-5 (A) NONE SEEN   Mucus PRESENT    Budding Yeast PRESENT    Hyaline Casts, UA PRESENT    Granular Casts, UA PRESENT    Ca Oxalate Crys, UA PRESENT     Comment: Performed at [facility], [place] [address], [place], [place] [address]  I-stat troponin, ED     Status: None   Collection Time: 06/02/17  9:29 PM  Result Value Ref Range   Troponin i, poc 0.02 0.00 - 0.08 ng/mL   Comment 3            Comment: Due to the release kinetics of cTnI, a negative result within the first hours of the onset of symptoms does not rule out myocardial infarction with certainty. If myocardial infarction is still suspected, repeat the test at appropriate intervals.  I-stat chem 8, ed     Status:  Abnormal   Collection Time: 06/02/17  9:30 PM  Result Value Ref Range   Sodium 134 (L) 135 - 145 mmol/L   Potassium 6.1 (H) 3.5 - 5.1 mmol/L   Chloride 101 101 - 111 mmol/L   BUN 38 (H) 6 - 20 mg/dL   Creatinine, Ser 1.20 0.61 - 1.24 mg/dL   Glucose, Bld 124 (H) 65 - 99 mg/dL   Calcium, Ion 1.07 (L) 1.15 - 1.40 mmol/L   TCO2 26 22 - 32 mmol/L   Hemoglobin 12.9 (L) 13.0 - 17.0 g/dL   HCT 38.0 (L) 39.0 - 52.0 %  I-Stat CG4 Lactic Acid, ED     Status: None   Collection Time: 06/02/17 10:33 PM  Result Value Ref Range   Lactic Acid, Venous 1.31 0.5 - 1.9 mmol/L   Dg Chest 2 View  Result Date: 06/02/2017 CLINICAL DATA:  Weakness EXAM: CHEST - 2 VIEW COMPARISON:  07/15/2016 FINDINGS: Cardiac shadow is stable. Two lead pacing device is now seen. No pneumothorax is noted. The overall inspiratory effort is poor. Increased density is noted in the right lung base which projects in the right lower lobe consistent with focal infiltrate. No sizable effusion is seen. No acute bony abnormality is noted. IMPRESSION: Right lower lobe infiltrate Electronically Signed   By: Inez Catalina M.D.   On: 06/02/2017 21:36   Ct Abdomen Pelvis W Contrast  Result Date: 06/02/2017 CLINICAL DATA:  Urinary retention with Foley catheter placement and only 100 cc of urine return. EXAM: CT ABDOMEN AND PELVIS WITH CONTRAST TECHNIQUE: Multidetector CT imaging of the abdomen and pelvis was performed using the standard protocol following bolus administration of intravenous contrast. CONTRAST:  187m ISOVUE-300 IOPAMIDOL (ISOVUE-300) INJECTION 61% COMPARISON:  Abdomen radiographs 12/28/2005 FINDINGS: Lower chest: Mild cardiomegaly with coron moderate left main and three-vessel coronary arteriosclerosis. No pericardial effusion. Small to moderate right pleural effusion with adjacent atelectasis. Mild peribronchial thickening in both lower lobes. Hepatobiliary: Small shrunken liver with nodular liver surface contour consistent with  cirrhosis. Calcified granulomata are present within. Numerous gallstones are seen within the nondistended gallbladder along the dependent aspect. No biliary dilatation. Pancreas: Atrophic pancreas. Spleen: No splenomegaly. Adrenals/Urinary Tract: Normal bilateral adrenal glands and kidneys. No hydronephrosis. The urinary bladder is difficult to visualize due to a large volume of surrounding ascites. What appears to represent the bladder appears decompressed with Foley catheter noted within. Stomach/Bowel: Nonobstructed, nondistended bowel loops, displaced centrally due to large volume of ascites. The appendix is normal in caliber. Vascular/Lymphatic: Moderate-to-marked aortoiliac and branch vessel atherosclerosis without aneurysm. No lymphadenopathy. There appear to be small paraesophageal varices. Reproductive: Status post prostatectomy with surgical clips along the pelvic sidewall bilaterally. Other: Large volume of ascites with third spacing of fluid. Musculoskeletal: No aggressive osseous lesions. IMPRESSION: 1. The over arching finding is that of cirrhosis with large volume of ascites and soft tissue anasarca. Ancillary findings of small paraesophageal varices. No splenomegaly. No hepatic mass. 2. Uncomplicated cholelithiasis. 3. Foley decompressed urinary bladder. 4. Status post prostatectomy. Electronically Signed   By: DAshley RoyaltyM.D.   On: 06/02/2017 23:22    Pending Labs Unresulted Labs (From admission, onward)   Start     Ordered   06/02/17 2206  Blood culture (routine x 2)  BLOOD CULTURE X 2,   STAT     06/02/17 2205   06/02/17 2152  Protime-INR  Once,   STAT     06/02/17 2152  06/02/17 2053  Urine culture  STAT,   STAT     06/02/17 2052   Signed and Held  Culture, blood (routine x 2) Call MD if unable to obtain prior to antibiotics being given  BLOOD CULTURE X 2,   R    Comments:  If blood cultures drawn in Emergency Department - Do not draw and cancel order   Question:  Patient immune  status  Answer:  Normal   Signed and Held   Signed and Held  Culture, sputum-assessment  Once,   R    Question:  Patient immune status  Answer:  Normal   Signed and Held   Signed and Held  Gram stain  Once,   R    Question:  Patient immune status  Answer:  Normal   Signed and Held   Signed and Held  HIV antibody (Routine Screening)  Once,   R     Signed and Held   Signed and Held  Strep pneumoniae urinary antigen  Once,   R     Signed and Held   Signed and Held  Legionella Pneumophila Serogp 1 Ur Ag  Once,   R     Signed and Held      Vitals/Pain Today's Vitals   06/02/17 2139 06/02/17 2210 06/02/17 2256 06/03/17 0000  BP: (!) 142/84 140/84 135/76 132/67  Pulse: 79 87 85 82  Resp: 20 (!) 1 19 (!) 23  Temp:      TempSrc:      SpO2: 100% 99% 99% 100%    Isolation Precautions No active isolations  Medications Medications  azithromycin (ZITHROMAX) 500 mg in sodium chloride 0.9 % 250 mL IVPB (500 mg Intravenous Transfusing/Transfer 06/03/17 0014)  iopamidol (ISOVUE-300) 61 % injection (has no administration in time range)  iopamidol (ISOVUE-300) 61 % injection 100 mL (100 mLs Intravenous Contrast Given 06/02/17 2239)  cefTRIAXone (ROCEPHIN) 1 g in sodium chloride 0.9 % 100 mL IVPB (0 g Intravenous Stopped 06/02/17 2304)    Mobility non-ambulatory at this time

## 2017-06-03 NOTE — Procedures (Signed)
PROCEDURE SUMMARY:  Successful US guided paracentesis from RLQ.  Yielded 8.1 L of clear yellow fluid.  No immediate complications.  Pt tolerated well.   Specimen was sent for labs.  Ascencion Dike PA-C 06/03/2017 10:35 AM

## 2017-06-03 NOTE — Progress Notes (Signed)
PROGRESS NOTE  ERCELL Chambers GGY:[id number] DOB: Apr 10, 1928 DOA: 06/02/2017 PCP: Brandon Merles, MD  HPI/Recap of past 24 hours:  He feels better after 8liter ascites fluids removed, he denies ab pain, no fever Family at bedside  Assessment/Plan: Principal Problem:   Community acquired pneumonia of right lower lobe of lung ([place]) Active Problems:   Diabetes mellitus ([place])   CLL (chronic lymphocytic leukemia) ([place])   Acute lower UTI   Pneumonia    Cirrhosis/ascites/esophageal varices -he is sent to the hospital from pcp's office from the [place] due to concerning for urinary retention, he does not have urinary retention, instead, he is found to have cirrhosis with large volume ascites on CT ab  -INR 1.1, plt 133  -no bleeding -s/p paracentesis with 8 liter clear yellow fluids removed. Fluid analysis with wbc 335, 89% lymphs, 2% neutrophil.  Gram stain, culture, cytology pending -this is a new diagnosis for him, start low dose propranolol, start spironolactone 50mg , continue lasix 20mg  daily   CT ab/pel: " 1. cirrhosis with large volume of ascites and soft tissue anasarca. Ancillary findings of small paraesophageal varices. No splenomegaly. No hepatic mass. 2. Uncomplicated cholelithiasis. 3. Foley decompressed urinary bladder. 4. Status post prostatectomy."  Bilateral lower extremity pitting edema: Family reports they were told this is from heart failure. This is chronic. He is on lasix 20mg  daily. I think the cirrhosis also contribute to the edema. Will get venous dopper to r/o DVT Add spironolactone, continue lasix.  CLL with elevated wbc, lymphocytosis, (40%), smudge cell -Seen Dr Brandon Chambers in 2016, he is now followed at [facility] hematology -Per family, he is under observation, no active treatment. Cbc close to his baseline. -I have discussed case with oncology on call at [facility] Dr Brandon Chambers, nothing need to be done in the hospital, [facility]patient basis.  Questionable UTI/PNA -cxr with RLL infiltrate, CT " Small to moderate right pleural effusion with adjacent atelectasis. Mild peribronchial thickening in both lower lobes" -he reports recently decreased urination, but no urinary obstruction. Denies dysuria, no fever, no abdominal pain -Urine strep/legionella antigen pending -Blood culture / urine culture in process, abdominal ascites does not appear to have sbp -Sputum culture ordered, no cough  -He is started on Rocephin/zithro on admission, change abx to augmentin.   Hyperkalemia: k 6 on admission, lab error vs true elevation Repeat bmp k 4.1, repeat in am Hold acei  Insulin dependent dm2 a1c 5 Home insulin dose might be too high Long acting held, he is on ssi here, he did not require much insulin coverage here Adjust insulin  H/o HTN/CVA Continue statin/plavix  H/o pacemaker, family reports he had pacemaker placement last year in 2018. Currently paced rhythm. Stable, no chest pain D/c tele.  H/o prostate cancer, s/p prostectomy  CKDII Renal function appear at baseline  FTT: will get PT eval.    Code Status: DNR, confirmed with family at bedside  Family Communication: patient and family  Disposition Plan: home on 4/21 if culture no growth    Consultants: IR  Phone conversation with oncology Dr Brandon Chambers  Procedures:  US guided paracentesis  Antibiotics:  Rocephin/zithro on admissin  augmentin from 4/20   Objective: BP 127/70 (BP Location: Left Arm)   Pulse 82   Temp 97.7 F (36.5 C) (Oral)   Resp (!) 22   Wt 87.9 kg (193 lb 12.6 oz)   SpO2 98%   BMI 26.28 kg/m   Intake/Output  Summary (Last 24 hours) at 06/03/2017 0804 Last data filed at 06/03/2017 0048 Gross per 24 hour  Intake 100 ml  Output 150 ml  Net -50 ml   Filed Weights   06/03/17 0048  Weight: 87.9 kg (193 lb 12.6 oz)    Exam: Patient is examined daily including today on 06/03/2017, exams remain the same as of  yesterday except that has changed    General:  Frail, NAD  Cardiovascular: paced rhythm  Respiratory: diminished at right base, no rales, no wheezing, no rhonchi  Abdomen: Soft/ND/NT, positive BS  Musculoskeletal:3+  bilateral lower extremity pitting  Edema  Neuro: alert, oriented   Data Reviewed: Basic Metabolic Panel: Recent Labs  Lab 06/02/17 2048 06/02/17 2130  NA 139 134*  K 4.6 6.1*  CL 104 101  CO2 23  --   GLUCOSE 133* 124*  BUN 28* 38*  CREATININE 1.22 1.20  CALCIUM 9.3  --    Liver Function Tests: Recent Labs  Lab 06/02/17 2048  AST 21  ALT 11*  ALKPHOS 86  BILITOT 2.3*  PROT 6.3*  ALBUMIN 3.8   No results for input(s): LIPASE, AMYLASE in the last 168 hours. No results for input(s): AMMONIA in the last 168 hours. CBC: Recent Labs  Lab 06/02/17 2048 06/02/17 2130  WBC 45.6*  --   NEUTROABS 5.0  --   HGB 12.6* 12.9*  HCT 38.9* 38.0*  MCV 100.5*  --   PLT 133*  --    Cardiac Enzymes:   No results for input(s): CKTOTAL, CKMB, CKMBINDEX, TROPONINI in the last 168 hours. BNP (last 3 results) Recent Labs    06/02/17 2054  BNP 223.1*    ProBNP (last 3 results) No results for input(s): PROBNP in the last 8760 hours.  CBG: Recent Labs  Lab 06/02/17 1719  GLUCAP 79    No results found for this or any previous visit (from the past 240 hour(s)).   Studies: Dg Chest 2 View  Result Date: 06/02/2017 CLINICAL DATA:  Weakness EXAM: CHEST - 2 VIEW COMPARISON:  07/15/2016 FINDINGS: Cardiac shadow is stable. Two lead pacing device is now seen. No pneumothorax is noted. The overall inspiratory effort is poor. Increased density is noted in the right lung base which projects in the right lower lobe consistent with focal infiltrate. No sizable effusion is seen. No acute bony abnormality is noted. IMPRESSION: Right lower lobe infiltrate Electronically Signed   By: Brandon Chambers M.D.   On: 06/02/2017 21:36   Ct Abdomen Pelvis W Contrast  Result Date:  06/02/2017 CLINICAL DATA:  Urinary retention with Foley catheter placement and only 100 cc of urine return. EXAM: CT ABDOMEN AND PELVIS WITH CONTRAST TECHNIQUE: Multidetector CT imaging of the abdomen and pelvis was performed using the standard protocol following bolus administration of intravenous contrast. CONTRAST:  151mL ISOVUE-300 IOPAMIDOL (ISOVUE-300) INJECTION 61% COMPARISON:  Abdomen radiographs 12/28/2005 FINDINGS: Lower chest: Mild cardiomegaly with coron moderate left main and three-vessel coronary arteriosclerosis. No pericardial effusion. Small to moderate right pleural effusion with adjacent atelectasis. Mild peribronchial thickening in both lower lobes. Hepatobiliary: Small shrunken liver with nodular liver surface contour consistent with cirrhosis. Calcified granulomata are present within. Numerous gallstones are seen within the nondistended gallbladder along the dependent aspect. No biliary dilatation. Pancreas: Atrophic pancreas. Spleen: No splenomegaly. Adrenals/Urinary Tract: Normal bilateral adrenal glands and kidneys. No hydronephrosis. The urinary bladder is difficult to visualize due to a large volume of surrounding ascites. What appears to represent the bladder appears decompressed  with Foley catheter noted within. Stomach/Bowel: Nonobstructed, nondistended bowel loops, displaced centrally due to large volume of ascites. The appendix is normal in caliber. Vascular/Lymphatic: Moderate-to-marked aortoiliac and branch vessel atherosclerosis without aneurysm. No lymphadenopathy. There appear to be small paraesophageal varices. Reproductive: Status post prostatectomy with surgical clips along the pelvic sidewall bilaterally. Other: Large volume of ascites with third spacing of fluid. Musculoskeletal: No aggressive osseous lesions. IMPRESSION: 1. The over arching finding is that of cirrhosis with large volume of ascites and soft tissue anasarca. Ancillary findings of small paraesophageal varices.  No splenomegaly. No hepatic mass. 2. Uncomplicated cholelithiasis. 3. Foley decompressed urinary bladder. 4. Status post prostatectomy. Electronically Signed   By: Ashley Royalty M.D.   On: 06/02/2017 23:22    Scheduled Meds: . atorvastatin  40 mg Oral q1800  . clopidogrel  75 mg Oral Daily  . furosemide  20 mg Oral Daily  . insulin aspart  0-5 Units Subcutaneous QHS  . insulin aspart  0-9 Units Subcutaneous TID WC  . iopamidol      . sodium chloride flush  3 mL Intravenous Q12H  . vitamin B-12  1,000 mcg Oral Daily    Continuous Infusions: . sodium chloride    . azithromycin    . cefTRIAXone (ROCEPHIN)  IV       Time spent: 35 mins I have personally reviewed and interpreted on  06/03/2017 daily labs, tele strips, imagings as discussed above under date review session and assessment and plans.  I reviewed all nursing notes, pharmacy notes, consultant notes,  vitals, pertinent old records  I have discussed plan of care as described above with RN , patient and family on 06/03/2017   Florencia Reasons MD, PhD  [facility] Pager [phone]. If 7PM-7AM, please contact night-coverage at [email or web address], password [facility] 06/03/2017, 8:04 AM  LOS: 1 day

## 2017-06-03 NOTE — Progress Notes (Signed)
Pt arrived to unit room 1513 via stretcher with son. VS taken and pt oriented to room and callbell with  No complications. Alert and oriented x3, general weakness 0/10 pain. Initial assessment completed. Pt guide at the bedside. Will continue to monitor.

## 2017-06-04 ENCOUNTER — Inpatient Hospital Stay (HOSPITAL_COMMUNITY): Payer: Medicare HMO

## 2017-06-04 DIAGNOSIS — R609 Edema, unspecified: Secondary | ICD-10-CM

## 2017-06-04 LAB — CBC WITH DIFFERENTIAL/PLATELET
BASOS ABS: 0 10*3/uL (ref 0.0–0.1)
BASOS PCT: 0 %
Eosinophils Absolute: 0 10*3/uL (ref 0.0–0.7)
Eosinophils Relative: 0 %
HCT: 29 % — ABNORMAL LOW (ref 39.0–52.0)
HEMOGLOBIN: 9.6 g/dL — AB (ref 13.0–17.0)
LYMPHS PCT: 89 %
Lymphs Abs: 28.8 10*3/uL — ABNORMAL HIGH (ref 0.7–4.0)
MCH: 33.1 pg (ref 26.0–34.0)
MCHC: 33.1 g/dL (ref 30.0–36.0)
MCV: 100 fL (ref 78.0–100.0)
Monocytes Absolute: 0.3 10*3/uL (ref 0.1–1.0)
Monocytes Relative: 1 %
NEUTROS PCT: 10 %
Neutro Abs: 3.2 10*3/uL (ref 1.7–7.7)
Platelets: 101 10*3/uL — ABNORMAL LOW (ref 150–400)
RBC: 2.9 MIL/uL — AB (ref 4.22–5.81)
RDW: 15 % (ref 11.5–15.5)
WBC: 32.3 10*3/uL — ABNORMAL HIGH (ref 4.0–10.5)

## 2017-06-04 LAB — HEPATIC FUNCTION PANEL
ALBUMIN: 2.6 g/dL — AB (ref 3.5–5.0)
ALK PHOS: 60 U/L (ref 38–126)
ALT: 9 U/L — AB (ref 17–63)
AST: 13 U/L — AB (ref 15–41)
Bilirubin, Direct: 0.4 mg/dL (ref 0.1–0.5)
Indirect Bilirubin: 1.2 mg/dL — ABNORMAL HIGH (ref 0.3–0.9)
TOTAL PROTEIN: 4.4 g/dL — AB (ref 6.5–8.1)
Total Bilirubin: 1.6 mg/dL — ABNORMAL HIGH (ref 0.3–1.2)

## 2017-06-04 LAB — HEPATITIS PANEL, ACUTE
HCV Ab: <0.1 s/co ratio (ref 0.0–0.9)
HEP B C IGM: NEGATIVE
HEP B S AG: NEGATIVE
Hep A IgM: NEGATIVE

## 2017-06-04 LAB — BASIC METABOLIC PANEL
ANION GAP: 8 (ref 5–15)
BUN: 23 mg/dL — ABNORMAL HIGH (ref 6–20)
CALCIUM: 8.5 mg/dL — AB (ref 8.9–10.3)
CO2: 24 mmol/L (ref 22–32)
Chloride: 107 mmol/L (ref 101–111)
Creatinine, Ser: 0.92 mg/dL (ref 0.61–1.24)
GFR calc non Af Amer: >60 mL/min (ref >60)
Glucose, Bld: 142 mg/dL — ABNORMAL HIGH (ref 65–99)
Potassium: 3.9 mmol/L (ref 3.5–5.1)
Sodium: 139 mmol/L (ref 135–145)

## 2017-06-04 LAB — GLUCOSE, CAPILLARY
Glucose-Capillary: 114 mg/dL — ABNORMAL HIGH (ref 65–99)
Glucose-Capillary: 146 mg/dL — ABNORMAL HIGH (ref 65–99)

## 2017-06-04 LAB — MAGNESIUM: Magnesium: 1.9 mg/dL (ref 1.7–2.4)

## 2017-06-04 MED ORDER — SPIRONOLACTONE 50 MG PO TABS: 50.0000 mg | ORAL_TABLET | Freq: Every day | ORAL | 0 refills | Status: AC

## 2017-06-04 MED ORDER — AMOXICILLIN-POT CLAVULANATE 500-125 MG PO TABS: 1.0000 | ORAL_TABLET | Freq: Two times a day (BID) | ORAL | 0 refills | Status: DC

## 2017-06-04 MED ORDER — RIFAXIMIN 550 MG PO TABS: 550.0000 mg | ORAL_TABLET | Freq: Two times a day (BID) | ORAL | 0 refills | Status: AC

## 2017-06-04 MED ORDER — PROPRANOLOL HCL 10 MG PO TABS: 10.0000 mg | ORAL_TABLET | Freq: Two times a day (BID) | ORAL | 0 refills | Status: AC

## 2017-06-04 MED ORDER — RIFAXIMIN 550 MG PO TABS
550.0000 mg | ORAL_TABLET | Freq: Two times a day (BID) | ORAL | Status: DC
  Filled 2017-06-04: qty 1

## 2017-06-04 MED ORDER — SACCHAROMYCES BOULARDII 250 MG PO CAPS: 250.0000 mg | ORAL_CAPSULE | Freq: Two times a day (BID) | ORAL | 0 refills | Status: AC

## 2017-06-04 MED ORDER — LISINOPRIL 2.5 MG PO TABS: 2.5000 mg | ORAL_TABLET | Freq: Every day | ORAL | 0 refills | Status: AC

## 2017-06-04 MED ORDER — AMOXICILLIN-POT CLAVULANATE 500-125 MG PO TABS: 1.0000 | ORAL_TABLET | Freq: Two times a day (BID) | ORAL | 0 refills | Status: AC

## 2017-06-04 NOTE — Patient Care Conference (Signed)
Brandon Chambers to be D/C'd Home per MD order.  Discussed prescriptions and follow up appointments with the patient. Prescriptions given to patient, medication list explained in detail. Pt verbalized understanding.  Allergies as of 06/04/2017      Reactions   Adhesive [tape] Rash   Codeine Rash      Medication List    STOP taking these medications   acetaminophen 500 MG tablet Commonly known as:  TYLENOL   insulin glargine 100 UNIT/ML injection Commonly known as:  LANTUS   lisinopril-hydrochlorothiazide 20-25 MG tablet Commonly known as:  PRINZIDE,ZESTORETIC     TAKE these medications   amoxicillin-clavulanate 500-125 MG tablet Commonly known as:  AUGMENTIN Take 1 tablet (500 mg total) by mouth 2 (two) times daily for 5 days.   atorvastatin 40 MG tablet Commonly known as:  LIPITOR TAKE 1 TABLET EVERY DAY What changed:    how much to take  how to take this  when to take this   blood glucose meter kit and supplies Kit Test blood sugar 3 times daily. Dx code: E11.9   clopidogrel 75 MG tablet Commonly known as:  PLAVIX TAKE 1 TABLET EVERY DAY   feeding supplement (ENSURE ENLIVE) Liqd Take 237 mLs by mouth daily.   furosemide 20 MG tablet Commonly known as:  LASIX Take 20 mg by mouth daily.   glucose blood test strip Commonly known as:  RELION GLUCOSE TEST STRIPS Test blood glucose 3 times a day. Dx Code 250.00   glucose blood test strip Test blood sugar 3 times daily. Dx code: E11.9   Lancets Misc Test blood sugar 3 times daily. Dx code: E11.9   lisinopril 2.5 MG tablet Commonly known as:  ZESTRIL Take 1 tablet (2.5 mg total) by mouth daily. What changed:    medication strength  how much to take   propranolol 10 MG tablet Commonly known as:  INDERAL Take 1 tablet (10 mg total) by mouth 2 (two) times daily.   rifaximin 550 MG Tabs tablet Commonly known as:  XIFAXAN Take 1 tablet (550 mg total) by mouth 2 (two) times daily.   saccharomyces  boulardii 250 MG capsule Commonly known as:  FLORASTOR Take 1 capsule (250 mg total) by mouth 2 (two) times daily for 10 days.   spironolactone 50 MG tablet Commonly known as:  ALDACTONE Take 1 tablet (50 mg total) by mouth daily.   vitamin B-12 1000 MCG tablet Commonly known as:  CYANOCOBALAMIN Take 1 tablet (1,000 mcg total) by mouth daily.       Vitals:   06/03/17 2202 06/04/17 0622  BP: (!) 113/57 128/68  Pulse: 66 (!) 57  Resp: 18 18  Temp: 98.2 F (36.8 C) 97.8 F (36.6 C)  SpO2: 97% 97%    Skin clean, dry and intact without evidence of skin break down, no evidence of skin tears noted. IV catheter discontinued intact. Site without signs and symptoms of complications. Dressing and pressure applied. Pt denies pain at this time. No complaints noted.  An After Visit Summary was printed and given to the patient. Patient escorted via [place], and D/C home via private auto.  Brandon Chambers BSN, RN [employer] Phone [phone]

## 2017-06-04 NOTE — Evaluation (Addendum)
Physical Therapy Evaluation Patient Details Name: Brandon Chambers MRN: [id number] DOB: 1928-07-23 Today's Date: 06/04/2017   History of Present Illness  84 yo male admitted with urinary retention, Pna. S/P paracentesis 4/20. Hx of CLL, DM, CVA, prostate cancer, pacemaker, CKD III    Clinical Impression  On eval, pt required Min assist for mobility. He walked ~115 feet with a RW. Pt presents with general weakness, decreased activity tolerance, and impaired gait and balance. Dyspnea 2/4 with activity-sats WNL. Discussed d/c plan with pt and son who was present during eval. Plan is for d/c home with HHPT f/u. Pt has 24/7 care available from family. Recommend RW use for safe ambulation. Instructed family to assist pt with all mobility, ADLs until he returns to baseline or until HHPT instructs them otherwise. Will follow and progress activity as tolerated.     Follow Up Recommendations Home health PT;Supervision/Assistance - 24 hour    Equipment Recommendations  None recommended by PT    Recommendations for Other Services       Precautions / Restrictions Precautions Precautions: Fall Restrictions Weight Bearing Restrictions: No      Mobility  Bed Mobility Overal bed mobility: Needs Assistance Bed Mobility: Supine to Sit     Supine to sit: Min assist;HOB elevated     General bed mobility comments: Assist for trunk. Increased time.   Transfers Overall transfer level: Needs assistance Equipment used: Rolling walker (2 wheeled) Transfers: Sit to/from Stand Sit to Stand: Min assist;From elevated surface         General transfer comment: Assist to rise, stabilize, control descent. VCS safety, hand placement.   Ambulation/Gait Ambulation/Gait assistance: Min guard Ambulation Distance (Feet): 115 Feet Assistive device: Rolling walker (2 wheeled) Gait Pattern/deviations: Step-through pattern;Trunk flexed     General Gait Details: Close guard for safety. Dyspnea 2/4-sats  WNL. Pt tolerated distance well.   Stairs            Wheelchair Mobility    Modified Rankin (Stroke Patients Only)       Balance Overall balance assessment: Needs assistance         Standing balance support: Bilateral upper extremity supported Standing balance-Leahy Scale: Poor                               Pertinent Vitals/Pain Pain Assessment: No/denies pain    Home Living Family/patient expects to be discharged to:: Private residence Living Arrangements: Children Available Help at Discharge: Family;Available 24 hours/day Type of Home: House Home Access: Stairs to enter   [employer] of Steps: small step Home Layout: One level Home Equipment: Walker - 2 wheels;Cane - quad      Prior Function Level of Independence: Independent with assistive device(s)         Comments: using walker PTA     Hand Dominance        Extremity/Trunk Assessment   Upper Extremity Assessment Upper Extremity Assessment: Generalized weakness    Lower Extremity Assessment Lower Extremity Assessment: Generalized weakness    Cervical / Trunk Assessment Cervical / Trunk Assessment: Kyphotic  Communication   Communication: No difficulties  Cognition Arousal/Alertness: Awake/alert Behavior During Therapy: WFL for tasks assessed/performed Overall Cognitive Status: Within Functional Limits for tasks assessed  General Comments      Exercises     Assessment/Plan    PT Assessment Patient needs continued PT services  PT Problem List Decreased strength;Decreased balance;Decreased mobility;Decreased activity tolerance;Decreased knowledge of use of DME       PT Treatment Interventions DME instruction;Gait training;Functional mobility training;Therapeutic activities;Balance training;Patient/family education;Therapeutic exercise    PT Goals (Current goals can be found in the Care Plan section)   Acute Rehab PT Goals Patient Stated Goal: home PT Goal Formulation: With patient/family Time For Goal Achievement: 06/18/17 Potential to Achieve Goals: Good    Frequency Min 3X/week   Barriers to discharge        Co-evaluation               AM-PAC PT "6 Clicks" Daily Activity  Outcome Measure Difficulty turning over in bed (including adjusting bedclothes, sheets and blankets)?: A Lot Difficulty moving from lying on back to sitting on the side of the bed? : Unable Difficulty sitting down on and standing up from a chair with arms (e.g., wheelchair, bedside commode, etc,.)?: Unable Help needed moving to and from a bed to chair (including a wheelchair)?: A Little Help needed walking in hospital room?: A Little Help needed climbing 3-5 steps with a railing? : A Little 6 Click Score: 13    End of Session Equipment Utilized During Treatment: Gait belt Activity Tolerance: Patient tolerated treatment well Patient left: with call bell/phone within reach;with family/visitor present;in chair   PT Visit Diagnosis: Muscle weakness (generalized) (M62.81);Difficulty in walking, not elsewhere classified (R26.2)    Time: 1655-3748 PT Time Calculation (min) (ACUTE ONLY): 19 min   Charges:   PT Evaluation $PT Eval Moderate Complexity: 1 Mod     PT G Codes:         Weston Anna, MPT Pager: [phone]

## 2017-06-04 NOTE — Care Management Note (Addendum)
Case Management Note  Patient Details  Name: Brandon Chambers MRN: [id number] Date of Birth: 07-13-1928  Subjective/Objective:       Pt from home with daughters.  PTA pt walked with walker in home only as he could only walk a few feet.  Pt's son would assist pt to MD.  Pt's son states pt has become progressively weaker during the last week and son doesn't think he can walk at prior level of functioning at his time.  They are unable to pay for SNF and want to take patient home, perhaps with WC.  Pt has used [facility] previously and would like to use again for [facility] needs.            Action/Plan: Order for PT eval is in.  If PT can work with patient today and obtain necessary equipment, pt can d/c today.  If they are not able to work with pt, MD told son she will cancel d/c and plan for d/c tomorrow.   Expected Discharge Date:  06/04/17               Expected Discharge Plan:  [place]  In-House Referral:  NA  Discharge planning Services  CM Consult  Post Acute Care Choice:  Durable Medical Equipment, Home Health Choice offered to:  Adult Children  DME Arranged:    DME Agency:     HH Arranged:  PT, RN [place] Agency:  [facility] (formerly [employer])  Status of Service:  In process, will continue to follow  If discussed at Long Length of Stay Meetings, dates discussed:    Additional Comments: Update 1600: Katina with [facility] contacted for referral for PT, RN.  SOC will be Tuesday 06/06/17. Pt already discharged.    Claudie Leach, RN 06/04/2017, 1:55 PM

## 2017-06-04 NOTE — Progress Notes (Signed)
LE venous duplex prelim: negative for DVT. Treena Cosman Eunice, RDMS, RVT  

## 2017-06-04 NOTE — Discharge Summary (Signed)
Discharge Summary  Brandon Chambers:[id number] DOB: Feb 12, 1929  PCP: Charlsie Merles, MD  Admit date: 06/02/2017 Discharge date: 06/04/2017  Time spent: 34mns  Recommendations for Outpatient Follow-up:  1. F/u with PCP within a week  for hospital discharge follow up, repeat cbc/cmp/mag/ammonia level at follow up. PCP to follow up on final culture result and ascites fluids cytology. 2. PCP to refer patient to GI for cirrhosis management.   Discharge Diagnoses:  Active Hospital Problems   Diagnosis Date Noted  . Community acquired pneumonia of right lower lobe of lung ([place] 06/02/2017  . Pneumonia 06/03/2017  . Acute lower UTI 07/11/2016  . CLL (chronic lymphocytic leukemia) ([place] 03/05/2012  . Diabetes mellitus ([facility]     Resolved Hospital Problems  No resolved problems to display.    Discharge Condition: stable  Diet recommendation: heart healthy/carb modified  Filed Weights   06/03/17 0048  Weight: 87.9 kg (193 lb 12.6 oz)    History of present illness: (per admitting MD Dr KMaudie Mercury  DBrigid Chambers is a 84y.o. male, w CLL , Dm2, hypertension, hyperlipidemia, CVA, prostate cancer apparently c/o generalized weakness, and went [place] and told them that he was having difficulty urinating and had a ?in and out cath w very little output and was told to go to ER for evaluation. Pt notes slight dry cough, denies fever, chills, cp, palp, sob, n/v, diarrhea, brbpr.   In ED, CXR IMPRESSION: Right lower lobe infiltrate   CT abd/ pelvis IMPRESSION: 1. The over arching finding is that of cirrhosis with large volume of ascites and soft tissue anasarca. Ancillary findings of small paraesophageal varices. No splenomegaly. No hepatic mass. 2. Uncomplicated cholelithiasis. 3. Foley decompressed urinary bladder. 4. Status post prostatectomy.  Na 139, K 4.6, Bun 28, Creatinine 1.22 Ast 21, Alt 11, Alk phos 86, T. Bili 2.3 Glucose 133  Wbc 45.6  (baseline 30) Hgb 12.6, Plt 133 Etoh <10  Urinalysis wbc 6-30, Rbc tntc  Trop 0.02    Hospital Course:  Principal Problem:   Community acquired pneumonia of right lower lobe of lung ([facility] Active Problems:   Diabetes mellitus ([place]   CLL (chronic lymphocytic leukemia) (HCC)   Acute lower UTI   Pneumonia   Cirrhosis/ascites/esophageal varices -he is sent to the hospital [place] for [place]. he is found to have cirrhosis with large volume ascites on CT ab  -INR 1.1, plt 133, alk phos 60, ast 13, alt 9, tbili 1.2  -no bleeding -s/p paracentesis with 8 liter clear yellow fluids removed. Fluid analysis with wbc 335, 89% lymphs, 2% neutrophil.  Gram stain negative for organism, culture/ cytology pending. pcp to follow up on final result. -this is a new diagnosis for him, per family patient has a past h/o alcohol use. Hepatitis panel negative. -he is started on low dose propranolol '10mg'$  bid, spironolactone '50mg'$ , rifaximin '550mg'$  bid. continue lasix '20mg'$  daily.   CT ab/pel: " 1. cirrhosis with large volume of ascites and soft tissue anasarca. Ancillary findings of small paraesophageal varices. No splenomegaly. No hepatic mass. 2. Uncomplicated cholelithiasis. 3. Foley decompressed urinary bladder. 4. Status post prostatectomy."  Bilateral lower extremity pitting edema: Family reports they were told this is from heart failure. This is chronic. He is on lasix '20mg'$  daily at home. I think the cirrhosis also contribute to the edema. venous dopper bilateral lower extremity negative for DVT Add spironolactone, continue lasix. Elevate legs, low sodium  diet.  CLL with elevated wbc, lymphocytosis, (40%), smudge cell -Seen Dr Marin Olp in 2016, he is now followed at [facility] hematology -Per family, he is under observation, no active treatment.  -I have discussed case with oncology on call at [facility] Dr Alen Blew who recommend continue follow with  [facility] hematology, no acute intervention needed in the hospital. -cbc 32, hgb 9.6, plt 101 at discharge. Patient is to have repeat labs next week and [facility]  Questionable UTI/PNA/bronchitis in an immunosuppressed individual  -cxr with RLL infiltrate, CT " Small to moderate right pleural effusion with adjacent atelectasis. Mild peribronchial thickening in both lower lobes" -he reports recently decreased urination. He denies dysuria, no fever, no abdominal pain. He voided spontaneously after foley removal. -Urine strep pneumo antigen +, urine legionella antigen in process -Blood culture / urine culture in process, abdominal ascites does not appear to have sbp. -Sputum culture ordered, no cough  -he has not fever. -He is started on Rocephin/zithro on admission, change abx to augmentin. He is discharged on augmentin to finish total of 7 days abx treatment. [facility]   Hyperkalemia?: k 6 on admission, lab error vs true elevation lisiopril held on admission,  k normalized at 3.9 at discharge Lisinopril dose decreased to 2.'5mg'$  daily, he is to repeat bmp next week.  Insulin dependent dm2, now off insulin a1c 5 He reports he was taken off insulin recently. ssi ordered in the hospital, he did not require any insulin coverage while in the hospital Blood glucose range from 114 to 147 while in the hospital.  He is not discharged on insulin Close follow up with [place] PCP.  H/o HTN/CVA Continue statin/plavix  H/o pacemaker, family reports he had pacemaker placement last year in 2018. Currently paced rhythm. Stable, no chest pain   H/o prostate cancer, s/p prostectomy He was sent to the hospital due to concerning for urinary retention, foley removed, he voided spontaneously.  CKDII Renal function appear at baseline  FTT: home health ordered.    Code Status: DNR, confirmed with family at bedside  Family Communication: patient and  family  Disposition Plan: home on 4/21     Consultants: IR  Phone conversation with oncology Dr Alen Blew  Procedures:  US guided paracentesis on 4/20  Antibiotics:  Rocephin/zithro on admissin  augmentin from 4/20   Discharge Exam: BP 128/68 (BP Location: Right Arm)   Pulse (!) 57   Temp 97.8 F (36.6 C) (Axillary)   Resp 18   Wt 87.9 kg (193 lb 12.6 oz)   SpO2 97%   BMI 26.28 kg/m   General: frail, NAD Cardiovascular: paced rhythm Respiratory: diminished diminished at right base, no rales, no wheezing, no rhonchi Abdomen: Soft/ND/NT, positive BS Musculoskeletal:pitting bilateral lower extremity pitting  Edema Neuro: alert, oriented     Discharge Instructions You were cared for by a hospitalist during your hospital stay. If you have any questions about your discharge medications or the care you received while you were in the hospital after you are discharged, you can call the unit and asked to speak with the hospitalist on call if the hospitalist that took care of you is not available. Once you are discharged, your primary care physician will handle any further medical issues. Please note that NO REFILLS for any discharge medications will be authorized once you are discharged, as it is imperative that you return to your primary care physician (or establish a relationship with a primary care physician if  you do not have one) for your aftercare needs so that they can reassess your need for medications and monitor your lab values.  Discharge Instructions    Diet - low sodium heart healthy   Complete by:  As directed    Carb modified diet   Increase activity slowly   Complete by:  As directed      Allergies as of 06/04/2017      Reactions   Adhesive [tape] Rash   Codeine Rash      Medication List    STOP taking these medications   acetaminophen 500 MG tablet Commonly known as:  TYLENOL   insulin glargine 100 UNIT/ML injection Commonly known as:   LANTUS   lisinopril-hydrochlorothiazide 20-25 MG tablet Commonly known as:  PRINZIDE,ZESTORETIC     TAKE these medications   amoxicillin-clavulanate 500-125 MG tablet Commonly known as:  AUGMENTIN Take 1 tablet (500 mg total) by mouth 2 (two) times daily for 5 days.   atorvastatin 40 MG tablet Commonly known as:  LIPITOR TAKE 1 TABLET EVERY DAY What changed:    how much to take  how to take this  when to take this   blood glucose meter kit and supplies Kit Test blood sugar 3 times daily. Dx code: E11.9   clopidogrel 75 MG tablet Commonly known as:  PLAVIX TAKE 1 TABLET EVERY DAY   feeding supplement (ENSURE ENLIVE) Liqd Take 237 mLs by mouth daily.   furosemide 20 MG tablet Commonly known as:  LASIX Take 20 mg by mouth daily.   glucose blood test strip Commonly known as:  RELION GLUCOSE TEST STRIPS Test blood glucose 3 times a day. Dx Code 250.00   glucose blood test strip Test blood sugar 3 times daily. Dx code: E11.9   Lancets Misc Test blood sugar 3 times daily. Dx code: E11.9   lisinopril 2.5 MG tablet Commonly known as:  ZESTRIL Take 1 tablet (2.5 mg total) by mouth daily. What changed:    medication strength  how much to take   propranolol 10 MG tablet Commonly known as:  INDERAL Take 1 tablet (10 mg total) by mouth 2 (two) times daily.   rifaximin 550 MG Tabs tablet Commonly known as:  XIFAXAN Take 1 tablet (550 mg total) by mouth 2 (two) times daily.   saccharomyces boulardii 250 MG capsule Commonly known as:  FLORASTOR Take 1 capsule (250 mg total) by mouth 2 (two) times daily for 10 days.   spironolactone 50 MG tablet Commonly known as:  ALDACTONE Take 1 tablet (50 mg total) by mouth daily.   vitamin B-12 1000 MCG tablet Commonly known as:  CYANOCOBALAMIN Take 1 tablet (1,000 mcg total) by mouth daily.      Allergies  Allergen Reactions  . Adhesive [Tape] Rash  . Codeine Rash   Follow-up Information    Borum, Jaci Standard, MD  Follow up in 1 week(s).   Specialty:  Internal Medicine Why:  hospital discharge follow up, repeat cbc/cmp/mag at follow up. pcp to follow up on final ascites fluids culture and cytology result. pcp to refer patient to GI for cirrhosis. Contact information: [place] [address] [phone]            The results of significant diagnostics from this hospitalization (including imaging, microbiology, ancillary and laboratory) are listed below for reference.    Significant Diagnostic Studies: Dg Chest 2 View  Result Date: 06/02/2017 CLINICAL DATA:  Weakness EXAM: CHEST - 2 VIEW COMPARISON:  07/15/2016 FINDINGS: Cardiac shadow is stable. Two lead pacing device is now seen. No pneumothorax is noted. The overall inspiratory effort is poor. Increased density is noted in the right lung base which projects in the right lower lobe consistent with focal infiltrate. No sizable effusion is seen. No acute bony abnormality is noted. IMPRESSION: Right lower lobe infiltrate Electronically Signed   By: Inez Catalina M.D.   On: 06/02/2017 21:36   Ct Abdomen Pelvis W Contrast  Result Date: 06/02/2017 CLINICAL DATA:  Urinary retention with Foley catheter placement and only 100 cc of urine return. EXAM: CT ABDOMEN AND PELVIS WITH CONTRAST TECHNIQUE: Multidetector CT imaging of the abdomen and pelvis was performed using the standard protocol following bolus administration of intravenous contrast. CONTRAST:  128m ISOVUE-300 IOPAMIDOL (ISOVUE-300) INJECTION 61% COMPARISON:  Abdomen radiographs 12/28/2005 FINDINGS: Lower chest: Mild cardiomegaly with coron moderate left main and three-vessel coronary arteriosclerosis. No pericardial effusion. Small to moderate right pleural effusion with adjacent atelectasis. Mild peribronchial thickening in both lower lobes. Hepatobiliary: Small shrunken liver with nodular liver surface contour consistent with cirrhosis. Calcified granulomata are  present within. Numerous gallstones are seen within the nondistended gallbladder along the dependent aspect. No biliary dilatation. Pancreas: Atrophic pancreas. Spleen: No splenomegaly. Adrenals/Urinary Tract: Normal bilateral adrenal glands and kidneys. No hydronephrosis. The urinary bladder is difficult to visualize due to a large volume of surrounding ascites. What appears to represent the bladder appears decompressed with Foley catheter noted within. Stomach/Bowel: Nonobstructed, nondistended bowel loops, displaced centrally due to large volume of ascites. The appendix is normal in caliber. Vascular/Lymphatic: Moderate-to-marked aortoiliac and branch vessel atherosclerosis without aneurysm. No lymphadenopathy. There appear to be small paraesophageal varices. Reproductive: Status post prostatectomy with surgical clips along the pelvic sidewall bilaterally. Other: Large volume of ascites with third spacing of fluid. Musculoskeletal: No aggressive osseous lesions. IMPRESSION: 1. The over arching finding is that of cirrhosis with large volume of ascites and soft tissue anasarca. Ancillary findings of small paraesophageal varices. No splenomegaly. No hepatic mass. 2. Uncomplicated cholelithiasis. 3. Foley decompressed urinary bladder. 4. Status post prostatectomy. Electronically Signed   By: DAshley RoyaltyM.D.   On: 06/02/2017 23:22   UKoreaParacentesis  Result Date: 06/03/2017 INDICATION: Abdominal discomfort and distention. Cirrhosis. Large volume ascites. Request diagnostic and therapeutic paracentesis. EXAM: ULTRASOUND GUIDED RIGHT LOWER QUADRANT PARACENTESIS MEDICATIONS: None. COMPLICATIONS: None immediate. PROCEDURE: Informed written consent was obtained from the patient after a discussion of the risks, benefits and alternatives to treatment. A timeout was performed prior to the initiation of the procedure. Initial ultrasound scanning demonstrates a large amount of ascites within the right lower abdominal  quadrant. The right lower abdomen was prepped and draped in the usual sterile fashion. 1% lidocaine with epinephrine was used for local anesthesia. Following this, a 19 gauge, 7-cm, Yueh catheter was introduced. An ultrasound image was saved for documentation purposes. The paracentesis was performed. The catheter was removed and a dressing was applied. The patient tolerated the procedure well without immediate post procedural complication. FINDINGS: A total of approximately 8.1 L of clear yellow fluid was removed. Samples were sent to the laboratory as requested by the clinical team. IMPRESSION: Successful ultrasound-guided paracentesis yielding 8.1 liters of peritoneal fluid. Read by: KAscencion DikePA-C Electronically Signed   By: DLucrezia EuropeM.D.   On: 06/03/2017 10:37    Microbiology: Recent Results (from the past 240 hour(s))  Urine culture     Status: Abnormal (Preliminary result)   Collection Time: 06/02/17  9:08 PM  Result Value Ref Range Status   Specimen Description   Final    URINE, CATHETERIZED Performed at [facility] [address], [place], [place] [address]    Special Requests   Final    NONE Performed at [facility], [place] [address], [place], [place] [address]    Culture 20,000 COLONIES/mL GRAM NEGATIVE RODS (A)  Final   Report Status PENDING  Incomplete  Gram stain     Status: None   Collection Time: 06/03/17 10:00 AM  Result Value Ref Range Status   Specimen Description PERITONEAL  Final   Special Requests NONE  Final   Gram Stain   Final    NO WBC SEEN NO ORGANISMS SEEN Performed at [facility], [place] [address] [place], [place] [address]    Report Status 06/03/2017 FINAL  Final     Labs: Basic Metabolic Panel: Recent Labs  Lab 06/02/17 2048 06/02/17 2130 06/03/17 0827 06/04/17 0527  NA 139 134* 139 139  K 4.6 6.1* 4.1 3.9  CL 104 101 107 107  CO2 23  --  24 24  GLUCOSE 133* 124* 120* 142*  BUN 28* 38* 26* 23*   CREATININE 1.22 1.20 1.08 0.92  CALCIUM 9.3  --  8.9 8.5*  MG  --   --   --  1.9   Liver Function Tests: Recent Labs  Lab 06/02/17 2048 06/04/17 0527  AST 21 13*  ALT 11* 9*  ALKPHOS 86 60  BILITOT 2.3* 1.6*  PROT 6.3* 4.4*  ALBUMIN 3.8 2.6*   No results for input(s): LIPASE, AMYLASE in the last 168 hours. No results for input(s): AMMONIA in the last 168 hours. CBC: Recent Labs  Lab 06/02/17 2048 06/02/17 2130 06/04/17 0527  WBC 45.6*  --  32.3*  NEUTROABS 5.0  --  3.2  HGB 12.6* 12.9* 9.6*  HCT 38.9* 38.0* 29.0*  MCV 100.5*  --  100.0  PLT 133*  --  101*   Cardiac Enzymes: No results for input(s): CKTOTAL, CKMB, CKMBINDEX, TROPONINI in the last 168 hours. BNP: BNP (last 3 results) Recent Labs    06/02/17 2054  BNP 223.1*    ProBNP (last 3 results) No results for input(s): PROBNP in the last 8760 hours.  CBG: Recent Labs  Lab 06/02/17 1719 06/03/17 1345 06/03/17 1844 06/03/17 2201 06/04/17 0722  GLUCAP 79 136* 140* 147* 114*       Signed:  Florencia Reasons MD, PhD  [facility] 06/04/2017, 10:30 AM

## 2017-06-05 LAB — URINE CULTURE: Culture: 20000 — AB

## 2017-06-05 LAB — PATHOLOGIST SMEAR REVIEW

## 2017-06-05 LAB — LEGIONELLA PNEUMOPHILA SEROGP 1 UR AG: L. PNEUMOPHILA SEROGP 1 UR AG: NEGATIVE

## 2017-06-08 LAB — CULTURE, BLOOD (ROUTINE X 2)
Culture: NO GROWTH
Culture: NO GROWTH
SPECIAL REQUESTS: ADEQUATE
Special Requests: ADEQUATE

## 2017-06-08 LAB — CULTURE, BODY FLUID W GRAM STAIN -BOTTLE

## 2017-06-08 LAB — CULTURE, BODY FLUID-BOTTLE: CULTURE: NO GROWTH

## 2017-06-09 ENCOUNTER — Telehealth: Payer: Self-pay | Admitting: *Deleted

## 2017-06-09 DIAGNOSIS — M1991 Primary osteoarthritis, unspecified site: Secondary | ICD-10-CM | POA: Diagnosis not present

## 2017-06-09 DIAGNOSIS — N183 Chronic kidney disease, stage 3 (moderate): Secondary | ICD-10-CM | POA: Diagnosis not present

## 2017-06-09 DIAGNOSIS — E1122 Type 2 diabetes mellitus with diabetic chronic kidney disease: Secondary | ICD-10-CM | POA: Diagnosis not present

## 2017-06-09 DIAGNOSIS — I129 Hypertensive chronic kidney disease with stage 1 through stage 4 chronic kidney disease, or unspecified chronic kidney disease: Secondary | ICD-10-CM | POA: Diagnosis not present

## 2017-06-09 DIAGNOSIS — F0390 Unspecified dementia without behavioral disturbance: Secondary | ICD-10-CM | POA: Diagnosis not present

## 2017-06-09 DIAGNOSIS — J181 Lobar pneumonia, unspecified organism: Secondary | ICD-10-CM | POA: Diagnosis not present

## 2017-06-09 NOTE — Telephone Encounter (Signed)
Faxed ROI to [facility]; release [phone]

## 2017-06-12 DIAGNOSIS — I129 Hypertensive chronic kidney disease with stage 1 through stage 4 chronic kidney disease, or unspecified chronic kidney disease: Secondary | ICD-10-CM | POA: Diagnosis not present

## 2017-06-12 DIAGNOSIS — F0390 Unspecified dementia without behavioral disturbance: Secondary | ICD-10-CM | POA: Diagnosis not present

## 2017-06-12 DIAGNOSIS — J181 Lobar pneumonia, unspecified organism: Secondary | ICD-10-CM | POA: Diagnosis not present

## 2017-06-12 DIAGNOSIS — M1991 Primary osteoarthritis, unspecified site: Secondary | ICD-10-CM | POA: Diagnosis not present

## 2017-06-12 DIAGNOSIS — E1122 Type 2 diabetes mellitus with diabetic chronic kidney disease: Secondary | ICD-10-CM | POA: Diagnosis not present

## 2017-06-12 DIAGNOSIS — N183 Chronic kidney disease, stage 3 (moderate): Secondary | ICD-10-CM | POA: Diagnosis not present

## 2017-06-13 ENCOUNTER — Other Ambulatory Visit: Payer: Self-pay | Admitting: *Deleted

## 2017-06-13 DIAGNOSIS — J181 Lobar pneumonia, unspecified organism: Secondary | ICD-10-CM | POA: Diagnosis not present

## 2017-06-13 DIAGNOSIS — N183 Chronic kidney disease, stage 3 (moderate): Secondary | ICD-10-CM | POA: Diagnosis not present

## 2017-06-13 DIAGNOSIS — F0390 Unspecified dementia without behavioral disturbance: Secondary | ICD-10-CM | POA: Diagnosis not present

## 2017-06-13 DIAGNOSIS — M1991 Primary osteoarthritis, unspecified site: Secondary | ICD-10-CM | POA: Diagnosis not present

## 2017-06-13 DIAGNOSIS — I129 Hypertensive chronic kidney disease with stage 1 through stage 4 chronic kidney disease, or unspecified chronic kidney disease: Secondary | ICD-10-CM | POA: Diagnosis not present

## 2017-06-13 DIAGNOSIS — E1122 Type 2 diabetes mellitus with diabetic chronic kidney disease: Secondary | ICD-10-CM | POA: Diagnosis not present

## 2017-06-13 NOTE — Patient Outreach (Signed)
[name]) Care Management  06/13/2017  Brandon Chambers 08-29-1928 [id number]  Red alert referral-EMMI-General Discharge-day#1, 06/06/2017: Reason: unfilled prescriptions-yes  Telephone call to patient; son took call & advised that he takes all of patient's healthcare calls & has been answering automated calls for patient.  Patient's son was advised of reason for call.   States all of patient's prescriptions have been filled at [facility] in [place].. States he & sister manage medications for patient and make sure he is taking all of medications as prescribed by his doctors.   States he makes sure patient gets to MD appointments as scheduled. Voices that he has already had hospital follow up appointment with primary care provider at [facility] clinic.   States home health services will start for patient on 5/2.  Voices he & sister have MD numbers available and know to call if patient has reportable symptoms. States they will call 911 for emergency symptoms.   States no further concerns at this time.   EMMI-Red alert was addressed.  Plan: Close case.   Sherrin Daisy, RN BSN [name]  [phone]

## 2017-06-17 DIAGNOSIS — J181 Lobar pneumonia, unspecified organism: Secondary | ICD-10-CM | POA: Diagnosis not present

## 2017-06-17 DIAGNOSIS — I129 Hypertensive chronic kidney disease with stage 1 through stage 4 chronic kidney disease, or unspecified chronic kidney disease: Secondary | ICD-10-CM | POA: Diagnosis not present

## 2017-06-17 DIAGNOSIS — F0390 Unspecified dementia without behavioral disturbance: Secondary | ICD-10-CM | POA: Diagnosis not present

## 2017-06-17 DIAGNOSIS — E1122 Type 2 diabetes mellitus with diabetic chronic kidney disease: Secondary | ICD-10-CM | POA: Diagnosis not present

## 2017-06-17 DIAGNOSIS — N183 Chronic kidney disease, stage 3 (moderate): Secondary | ICD-10-CM | POA: Diagnosis not present

## 2017-06-17 DIAGNOSIS — M1991 Primary osteoarthritis, unspecified site: Secondary | ICD-10-CM | POA: Diagnosis not present

## 2017-06-20 DIAGNOSIS — F0390 Unspecified dementia without behavioral disturbance: Secondary | ICD-10-CM | POA: Diagnosis not present

## 2017-06-20 DIAGNOSIS — I129 Hypertensive chronic kidney disease with stage 1 through stage 4 chronic kidney disease, or unspecified chronic kidney disease: Secondary | ICD-10-CM | POA: Diagnosis not present

## 2017-06-20 DIAGNOSIS — M1991 Primary osteoarthritis, unspecified site: Secondary | ICD-10-CM | POA: Diagnosis not present

## 2017-06-20 DIAGNOSIS — E1122 Type 2 diabetes mellitus with diabetic chronic kidney disease: Secondary | ICD-10-CM | POA: Diagnosis not present

## 2017-06-20 DIAGNOSIS — N183 Chronic kidney disease, stage 3 (moderate): Secondary | ICD-10-CM | POA: Diagnosis not present

## 2017-06-20 DIAGNOSIS — J181 Lobar pneumonia, unspecified organism: Secondary | ICD-10-CM | POA: Diagnosis not present

## 2017-06-22 DIAGNOSIS — I129 Hypertensive chronic kidney disease with stage 1 through stage 4 chronic kidney disease, or unspecified chronic kidney disease: Secondary | ICD-10-CM | POA: Diagnosis not present

## 2017-06-22 DIAGNOSIS — J181 Lobar pneumonia, unspecified organism: Secondary | ICD-10-CM | POA: Diagnosis not present

## 2017-06-22 DIAGNOSIS — F0390 Unspecified dementia without behavioral disturbance: Secondary | ICD-10-CM | POA: Diagnosis not present

## 2017-06-22 DIAGNOSIS — N183 Chronic kidney disease, stage 3 (moderate): Secondary | ICD-10-CM | POA: Diagnosis not present

## 2017-06-22 DIAGNOSIS — E1122 Type 2 diabetes mellitus with diabetic chronic kidney disease: Secondary | ICD-10-CM | POA: Diagnosis not present

## 2017-06-22 DIAGNOSIS — M1991 Primary osteoarthritis, unspecified site: Secondary | ICD-10-CM | POA: Diagnosis not present

## 2017-06-23 DIAGNOSIS — I129 Hypertensive chronic kidney disease with stage 1 through stage 4 chronic kidney disease, or unspecified chronic kidney disease: Secondary | ICD-10-CM | POA: Diagnosis not present

## 2017-06-23 DIAGNOSIS — M1991 Primary osteoarthritis, unspecified site: Secondary | ICD-10-CM | POA: Diagnosis not present

## 2017-06-23 DIAGNOSIS — F0390 Unspecified dementia without behavioral disturbance: Secondary | ICD-10-CM | POA: Diagnosis not present

## 2017-06-23 DIAGNOSIS — J181 Lobar pneumonia, unspecified organism: Secondary | ICD-10-CM | POA: Diagnosis not present

## 2017-06-23 DIAGNOSIS — N183 Chronic kidney disease, stage 3 (moderate): Secondary | ICD-10-CM | POA: Diagnosis not present

## 2017-06-23 DIAGNOSIS — E1122 Type 2 diabetes mellitus with diabetic chronic kidney disease: Secondary | ICD-10-CM | POA: Diagnosis not present

## 2017-06-27 DIAGNOSIS — M1991 Primary osteoarthritis, unspecified site: Secondary | ICD-10-CM | POA: Diagnosis not present

## 2017-06-27 DIAGNOSIS — E1122 Type 2 diabetes mellitus with diabetic chronic kidney disease: Secondary | ICD-10-CM | POA: Diagnosis not present

## 2017-06-27 DIAGNOSIS — N183 Chronic kidney disease, stage 3 (moderate): Secondary | ICD-10-CM | POA: Diagnosis not present

## 2017-06-27 DIAGNOSIS — J181 Lobar pneumonia, unspecified organism: Secondary | ICD-10-CM | POA: Diagnosis not present

## 2017-06-27 DIAGNOSIS — F0390 Unspecified dementia without behavioral disturbance: Secondary | ICD-10-CM | POA: Diagnosis not present

## 2017-06-27 DIAGNOSIS — I129 Hypertensive chronic kidney disease with stage 1 through stage 4 chronic kidney disease, or unspecified chronic kidney disease: Secondary | ICD-10-CM | POA: Diagnosis not present

## 2017-06-29 DIAGNOSIS — I129 Hypertensive chronic kidney disease with stage 1 through stage 4 chronic kidney disease, or unspecified chronic kidney disease: Secondary | ICD-10-CM | POA: Diagnosis not present

## 2017-06-29 DIAGNOSIS — F0390 Unspecified dementia without behavioral disturbance: Secondary | ICD-10-CM | POA: Diagnosis not present

## 2017-06-29 DIAGNOSIS — J181 Lobar pneumonia, unspecified organism: Secondary | ICD-10-CM | POA: Diagnosis not present

## 2017-06-29 DIAGNOSIS — M1991 Primary osteoarthritis, unspecified site: Secondary | ICD-10-CM | POA: Diagnosis not present

## 2017-06-29 DIAGNOSIS — E1122 Type 2 diabetes mellitus with diabetic chronic kidney disease: Secondary | ICD-10-CM | POA: Diagnosis not present

## 2017-06-29 DIAGNOSIS — N183 Chronic kidney disease, stage 3 (moderate): Secondary | ICD-10-CM | POA: Diagnosis not present

## 2017-07-04 DIAGNOSIS — I129 Hypertensive chronic kidney disease with stage 1 through stage 4 chronic kidney disease, or unspecified chronic kidney disease: Secondary | ICD-10-CM | POA: Diagnosis not present

## 2017-07-04 DIAGNOSIS — M1991 Primary osteoarthritis, unspecified site: Secondary | ICD-10-CM | POA: Diagnosis not present

## 2017-07-04 DIAGNOSIS — E1122 Type 2 diabetes mellitus with diabetic chronic kidney disease: Secondary | ICD-10-CM | POA: Diagnosis not present

## 2017-07-04 DIAGNOSIS — N183 Chronic kidney disease, stage 3 (moderate): Secondary | ICD-10-CM | POA: Diagnosis not present

## 2017-07-04 DIAGNOSIS — J181 Lobar pneumonia, unspecified organism: Secondary | ICD-10-CM | POA: Diagnosis not present

## 2017-07-04 DIAGNOSIS — F0390 Unspecified dementia without behavioral disturbance: Secondary | ICD-10-CM | POA: Diagnosis not present

## 2017-07-06 DIAGNOSIS — E1122 Type 2 diabetes mellitus with diabetic chronic kidney disease: Secondary | ICD-10-CM | POA: Diagnosis not present

## 2017-07-06 DIAGNOSIS — M1991 Primary osteoarthritis, unspecified site: Secondary | ICD-10-CM | POA: Diagnosis not present

## 2017-07-06 DIAGNOSIS — J181 Lobar pneumonia, unspecified organism: Secondary | ICD-10-CM | POA: Diagnosis not present

## 2017-07-06 DIAGNOSIS — F0390 Unspecified dementia without behavioral disturbance: Secondary | ICD-10-CM | POA: Diagnosis not present

## 2017-07-06 DIAGNOSIS — N183 Chronic kidney disease, stage 3 (moderate): Secondary | ICD-10-CM | POA: Diagnosis not present

## 2017-07-06 DIAGNOSIS — I129 Hypertensive chronic kidney disease with stage 1 through stage 4 chronic kidney disease, or unspecified chronic kidney disease: Secondary | ICD-10-CM | POA: Diagnosis not present

## 2017-07-11 DIAGNOSIS — I129 Hypertensive chronic kidney disease with stage 1 through stage 4 chronic kidney disease, or unspecified chronic kidney disease: Secondary | ICD-10-CM | POA: Diagnosis not present

## 2017-07-11 DIAGNOSIS — N183 Chronic kidney disease, stage 3 (moderate): Secondary | ICD-10-CM | POA: Diagnosis not present

## 2017-07-11 DIAGNOSIS — E1122 Type 2 diabetes mellitus with diabetic chronic kidney disease: Secondary | ICD-10-CM | POA: Diagnosis not present

## 2017-07-11 DIAGNOSIS — F0390 Unspecified dementia without behavioral disturbance: Secondary | ICD-10-CM | POA: Diagnosis not present

## 2017-07-11 DIAGNOSIS — M1991 Primary osteoarthritis, unspecified site: Secondary | ICD-10-CM | POA: Diagnosis not present

## 2017-07-11 DIAGNOSIS — J181 Lobar pneumonia, unspecified organism: Secondary | ICD-10-CM | POA: Diagnosis not present

## 2017-07-13 DIAGNOSIS — M1991 Primary osteoarthritis, unspecified site: Secondary | ICD-10-CM | POA: Diagnosis not present

## 2017-07-13 DIAGNOSIS — E1122 Type 2 diabetes mellitus with diabetic chronic kidney disease: Secondary | ICD-10-CM | POA: Diagnosis not present

## 2017-07-13 DIAGNOSIS — N183 Chronic kidney disease, stage 3 (moderate): Secondary | ICD-10-CM | POA: Diagnosis not present

## 2017-07-13 DIAGNOSIS — I129 Hypertensive chronic kidney disease with stage 1 through stage 4 chronic kidney disease, or unspecified chronic kidney disease: Secondary | ICD-10-CM | POA: Diagnosis not present

## 2017-07-13 DIAGNOSIS — J181 Lobar pneumonia, unspecified organism: Secondary | ICD-10-CM | POA: Diagnosis not present

## 2017-07-13 DIAGNOSIS — F0390 Unspecified dementia without behavioral disturbance: Secondary | ICD-10-CM | POA: Diagnosis not present

## 2017-07-18 DIAGNOSIS — I129 Hypertensive chronic kidney disease with stage 1 through stage 4 chronic kidney disease, or unspecified chronic kidney disease: Secondary | ICD-10-CM | POA: Diagnosis not present

## 2017-07-18 DIAGNOSIS — E1122 Type 2 diabetes mellitus with diabetic chronic kidney disease: Secondary | ICD-10-CM | POA: Diagnosis not present

## 2017-07-18 DIAGNOSIS — J181 Lobar pneumonia, unspecified organism: Secondary | ICD-10-CM | POA: Diagnosis not present

## 2017-07-18 DIAGNOSIS — F0390 Unspecified dementia without behavioral disturbance: Secondary | ICD-10-CM | POA: Diagnosis not present

## 2017-07-18 DIAGNOSIS — M1991 Primary osteoarthritis, unspecified site: Secondary | ICD-10-CM | POA: Diagnosis not present

## 2017-07-18 DIAGNOSIS — N183 Chronic kidney disease, stage 3 (moderate): Secondary | ICD-10-CM | POA: Diagnosis not present

## 2017-07-21 DIAGNOSIS — F0390 Unspecified dementia without behavioral disturbance: Secondary | ICD-10-CM | POA: Diagnosis not present

## 2017-07-21 DIAGNOSIS — J181 Lobar pneumonia, unspecified organism: Secondary | ICD-10-CM | POA: Diagnosis not present

## 2017-07-21 DIAGNOSIS — M1991 Primary osteoarthritis, unspecified site: Secondary | ICD-10-CM | POA: Diagnosis not present

## 2017-07-21 DIAGNOSIS — I129 Hypertensive chronic kidney disease with stage 1 through stage 4 chronic kidney disease, or unspecified chronic kidney disease: Secondary | ICD-10-CM | POA: Diagnosis not present

## 2017-07-21 DIAGNOSIS — N183 Chronic kidney disease, stage 3 (moderate): Secondary | ICD-10-CM | POA: Diagnosis not present

## 2017-07-21 DIAGNOSIS — E1122 Type 2 diabetes mellitus with diabetic chronic kidney disease: Secondary | ICD-10-CM | POA: Diagnosis not present

## 2017-07-25 DIAGNOSIS — J181 Lobar pneumonia, unspecified organism: Secondary | ICD-10-CM | POA: Diagnosis not present

## 2017-07-25 DIAGNOSIS — F0390 Unspecified dementia without behavioral disturbance: Secondary | ICD-10-CM | POA: Diagnosis not present

## 2017-07-25 DIAGNOSIS — N183 Chronic kidney disease, stage 3 (moderate): Secondary | ICD-10-CM | POA: Diagnosis not present

## 2017-07-25 DIAGNOSIS — E1122 Type 2 diabetes mellitus with diabetic chronic kidney disease: Secondary | ICD-10-CM | POA: Diagnosis not present

## 2017-07-25 DIAGNOSIS — I129 Hypertensive chronic kidney disease with stage 1 through stage 4 chronic kidney disease, or unspecified chronic kidney disease: Secondary | ICD-10-CM | POA: Diagnosis not present

## 2017-07-25 DIAGNOSIS — M1991 Primary osteoarthritis, unspecified site: Secondary | ICD-10-CM | POA: Diagnosis not present

## 2017-07-27 DIAGNOSIS — J181 Lobar pneumonia, unspecified organism: Secondary | ICD-10-CM | POA: Diagnosis not present

## 2017-07-27 DIAGNOSIS — M1991 Primary osteoarthritis, unspecified site: Secondary | ICD-10-CM | POA: Diagnosis not present

## 2017-07-27 DIAGNOSIS — F0390 Unspecified dementia without behavioral disturbance: Secondary | ICD-10-CM | POA: Diagnosis not present

## 2017-07-27 DIAGNOSIS — N183 Chronic kidney disease, stage 3 (moderate): Secondary | ICD-10-CM | POA: Diagnosis not present

## 2017-07-27 DIAGNOSIS — I129 Hypertensive chronic kidney disease with stage 1 through stage 4 chronic kidney disease, or unspecified chronic kidney disease: Secondary | ICD-10-CM | POA: Diagnosis not present

## 2017-07-27 DIAGNOSIS — E1122 Type 2 diabetes mellitus with diabetic chronic kidney disease: Secondary | ICD-10-CM | POA: Diagnosis not present

## 2017-07-31 DIAGNOSIS — F0390 Unspecified dementia without behavioral disturbance: Secondary | ICD-10-CM | POA: Diagnosis not present

## 2017-07-31 DIAGNOSIS — M1991 Primary osteoarthritis, unspecified site: Secondary | ICD-10-CM | POA: Diagnosis not present

## 2017-07-31 DIAGNOSIS — J181 Lobar pneumonia, unspecified organism: Secondary | ICD-10-CM | POA: Diagnosis not present

## 2017-07-31 DIAGNOSIS — N183 Chronic kidney disease, stage 3 (moderate): Secondary | ICD-10-CM | POA: Diagnosis not present

## 2017-07-31 DIAGNOSIS — I129 Hypertensive chronic kidney disease with stage 1 through stage 4 chronic kidney disease, or unspecified chronic kidney disease: Secondary | ICD-10-CM | POA: Diagnosis not present

## 2017-07-31 DIAGNOSIS — E1122 Type 2 diabetes mellitus with diabetic chronic kidney disease: Secondary | ICD-10-CM | POA: Diagnosis not present

## 2017-08-03 DIAGNOSIS — N183 Chronic kidney disease, stage 3 (moderate): Secondary | ICD-10-CM | POA: Diagnosis not present

## 2017-08-03 DIAGNOSIS — E1122 Type 2 diabetes mellitus with diabetic chronic kidney disease: Secondary | ICD-10-CM | POA: Diagnosis not present

## 2017-08-03 DIAGNOSIS — I129 Hypertensive chronic kidney disease with stage 1 through stage 4 chronic kidney disease, or unspecified chronic kidney disease: Secondary | ICD-10-CM | POA: Diagnosis not present

## 2017-08-03 DIAGNOSIS — M1991 Primary osteoarthritis, unspecified site: Secondary | ICD-10-CM | POA: Diagnosis not present

## 2017-08-03 DIAGNOSIS — J181 Lobar pneumonia, unspecified organism: Secondary | ICD-10-CM | POA: Diagnosis not present

## 2017-08-03 DIAGNOSIS — F0390 Unspecified dementia without behavioral disturbance: Secondary | ICD-10-CM | POA: Diagnosis not present

## 2017-08-04 ENCOUNTER — Emergency Department (HOSPITAL_COMMUNITY)
Admission: EM | Admit: 2017-08-04 | Discharge: 2017-08-04 | Disposition: A | Discharge status: Home / Self Care | Payer: Medicare HMO | Attending: Emergency Medicine | Admitting: Emergency Medicine

## 2017-08-04 ENCOUNTER — Emergency Department (HOSPITAL_COMMUNITY): Payer: Medicare HMO

## 2017-08-04 ENCOUNTER — Other Ambulatory Visit: Payer: Self-pay

## 2017-08-04 DIAGNOSIS — Y929 Unspecified place or not applicable: Secondary | ICD-10-CM | POA: Diagnosis not present

## 2017-08-04 DIAGNOSIS — E119 Type 2 diabetes mellitus without complications: Secondary | ICD-10-CM | POA: Insufficient documentation

## 2017-08-04 DIAGNOSIS — S3982XA Other specified injuries of lower back, initial encounter: Secondary | ICD-10-CM | POA: Diagnosis present

## 2017-08-04 DIAGNOSIS — S32010A Wedge compression fracture of first lumbar vertebra, initial encounter for closed fracture: Secondary | ICD-10-CM | POA: Insufficient documentation

## 2017-08-04 DIAGNOSIS — W19XXXA Unspecified fall, initial encounter: Secondary | ICD-10-CM | POA: Diagnosis not present

## 2017-08-04 DIAGNOSIS — I10 Essential (primary) hypertension: Secondary | ICD-10-CM | POA: Insufficient documentation

## 2017-08-04 DIAGNOSIS — Z87891 Personal history of nicotine dependence: Secondary | ICD-10-CM | POA: Insufficient documentation

## 2017-08-04 DIAGNOSIS — Y9389 Activity, other specified: Secondary | ICD-10-CM | POA: Diagnosis not present

## 2017-08-04 DIAGNOSIS — Z79899 Other long term (current) drug therapy: Secondary | ICD-10-CM | POA: Insufficient documentation

## 2017-08-04 DIAGNOSIS — Y999 Unspecified external cause status: Secondary | ICD-10-CM | POA: Insufficient documentation

## 2017-08-04 DIAGNOSIS — Z7902 Long term (current) use of antithrombotics/antiplatelets: Secondary | ICD-10-CM | POA: Insufficient documentation

## 2017-08-04 DIAGNOSIS — R0602 Shortness of breath: Secondary | ICD-10-CM | POA: Diagnosis not present

## 2017-08-04 DIAGNOSIS — M25552 Pain in left hip: Secondary | ICD-10-CM | POA: Diagnosis not present

## 2017-08-04 DIAGNOSIS — Z8546 Personal history of malignant neoplasm of prostate: Secondary | ICD-10-CM | POA: Insufficient documentation

## 2017-08-04 DIAGNOSIS — W1839XA Other fall on same level, initial encounter: Secondary | ICD-10-CM | POA: Insufficient documentation

## 2017-08-04 DIAGNOSIS — R609 Edema, unspecified: Secondary | ICD-10-CM | POA: Diagnosis not present

## 2017-08-04 DIAGNOSIS — M25551 Pain in right hip: Secondary | ICD-10-CM | POA: Diagnosis not present

## 2017-08-04 DIAGNOSIS — Z8673 Personal history of transient ischemic attack (TIA), and cerebral infarction without residual deficits: Secondary | ICD-10-CM | POA: Insufficient documentation

## 2017-08-04 DIAGNOSIS — Z85828 Personal history of other malignant neoplasm of skin: Secondary | ICD-10-CM | POA: Insufficient documentation

## 2017-08-04 DIAGNOSIS — S3993XA Unspecified injury of pelvis, initial encounter: Secondary | ICD-10-CM | POA: Diagnosis not present

## 2017-08-04 DIAGNOSIS — Y92009 Unspecified place in unspecified non-institutional (private) residence as the place of occurrence of the external cause: Secondary | ICD-10-CM

## 2017-08-04 DIAGNOSIS — R42 Dizziness and giddiness: Secondary | ICD-10-CM | POA: Diagnosis not present

## 2017-08-04 DIAGNOSIS — M545 Low back pain: Secondary | ICD-10-CM | POA: Diagnosis not present

## 2017-08-04 DIAGNOSIS — R799 Abnormal finding of blood chemistry, unspecified: Secondary | ICD-10-CM | POA: Diagnosis not present

## 2017-08-04 LAB — BASIC METABOLIC PANEL
Anion gap: 6 (ref 5–15)
BUN: 17 mg/dL (ref 6–20)
CALCIUM: 9.2 mg/dL (ref 8.9–10.3)
CO2: 24 mmol/L (ref 22–32)
CREATININE: 1.01 mg/dL (ref 0.61–1.24)
Chloride: 104 mmol/L (ref 101–111)
GFR calc Af Amer: >60 mL/min (ref >60)
GLUCOSE: 204 mg/dL — AB (ref 65–99)
Potassium: 4.9 mmol/L (ref 3.5–5.1)
Sodium: 134 mmol/L — ABNORMAL LOW (ref 135–145)

## 2017-08-04 LAB — CBC WITH DIFFERENTIAL/PLATELET
BASOS PCT: 0 %
Basophils Absolute: 0 10*3/uL (ref 0.0–0.1)
Eosinophils Absolute: 0 10*3/uL (ref 0.0–0.7)
Eosinophils Relative: 0 %
HCT: 37.4 % — ABNORMAL LOW (ref 39.0–52.0)
Hemoglobin: 11.9 g/dL — ABNORMAL LOW (ref 13.0–17.0)
LYMPHS ABS: 30.4 10*3/uL — AB (ref 0.7–4.0)
Lymphocytes Relative: 85 %
MCH: 32.6 pg (ref 26.0–34.0)
MCHC: 31.8 g/dL (ref 30.0–36.0)
MCV: 102.5 fL — AB (ref 78.0–100.0)
MONO ABS: 0.7 10*3/uL (ref 0.1–1.0)
Monocytes Relative: 2 %
NEUTROS ABS: 4.7 10*3/uL (ref 1.7–7.7)
NEUTROS PCT: 13 %
Platelets: 162 10*3/uL (ref 150–400)
RBC: 3.65 MIL/uL — ABNORMAL LOW (ref 4.22–5.81)
RDW: 14.3 % (ref 11.5–15.5)
WBC: 35.8 10*3/uL — ABNORMAL HIGH (ref 4.0–10.5)

## 2017-08-04 MED ORDER — HYDROCODONE-ACETAMINOPHEN 5-325 MG PO TABS: 1.0000 | ORAL_TABLET | ORAL | 0 refills | Status: AC | PRN

## 2017-08-04 MED ORDER — HYDROCODONE-ACETAMINOPHEN 5-325 MG PO TABS
1.0000 | ORAL_TABLET | Freq: Once | ORAL | Status: AC
  Administered 2017-08-04: 1 via ORAL
  Filled 2017-08-04: qty 1

## 2017-08-04 NOTE — Discharge Instructions (Addendum)
Avoid excess upright physical activity Hydrocodone for pain.

## 2017-08-04 NOTE — ED Provider Notes (Signed)
[facility] EMERGENCY DEPARTMENT Provider Note   CSN: [id number] Arrival date & time: 08/04/17  1859     History   Chief Complaint Chief Complaint  Patient presents with  . Fall  . Weakness    HPI Brandon Chambers is a 84 y.o. male.  84 year old male.  Normally ambulates with a walker.  However, at times does not.  Today he was attempting to lean over to pick up a piece of paper when he fell to his right.  His family was able to help him up.  He was able to stand with assist but had pain in his back.  He presents here.  HPI  Past Medical History:  Diagnosis Date  . Arthritis   . CLL (chronic lymphocytic leukemia) ([place])   . Diabetes mellitus ([place])   . Hypercholesteremia   . Hypertension   . Prostate cancer ([place])   . Skin cancer (melanoma) ([place])   . SOB (shortness of breath)   . Stroke [facility])     Patient Active Problem List   Diagnosis Date Noted  . Pneumonia 06/03/2017  . Other ascites   . Community acquired pneumonia of right lower lobe of lung ([place]) 06/02/2017  . Cellulitis of left leg 07/15/2016  . UTI (urinary tract infection) 07/12/2016  . Acute lower UTI 07/11/2016  . Hyponatremia 07/11/2016  . Acute metabolic encephalopathy 27/78/2423  . AKI (acute kidney injury) ([place]) 07/11/2016  . Dehydration 07/11/2016  . Thrombocytopenia ([place]) 07/11/2016  . QT prolongation 07/11/2016  . HLD (hyperlipidemia) 12/02/2013  . B12 deficiency 12/02/2013  . Prostate cancer ([place]) 09/04/2012  . CLL (chronic lymphocytic leukemia) ([place]) 03/05/2012  . Anticoagulated on Coumadin 03/05/2012  . CVA (cerebral vascular accident) ([place]) 03/05/2012  . AV block 10/21/2011  . Diabetes mellitus ([place])   . Hypercholesteremia   . Hypertension   . SOB (shortness of breath)     Past Surgical History:  Procedure Laterality Date  . BYPASS GRAFT Left    leg  . CATARACT EXTRACTION Bilateral   . PROSTATE SURGERY          Home Medications    Prior to Admission  medications   Medication Sig Start Date End Date Taking? Authorizing Provider  atorvastatin (LIPITOR) 40 MG tablet TAKE 1 TABLET EVERY DAY Patient taking differently: TAKE 1 TABLET BY MOUTH EVERY DAY 10/23/14   Rosalin Hawking, MD  blood glucose meter kit and supplies KIT Test blood sugar 3 times daily. Dx code: E11.9 04/22/14   Darlyne Russian, MD  clopidogrel (PLAVIX) 75 MG tablet TAKE 1 TABLET EVERY DAY 10/23/14   Rosalin Hawking, MD  feeding supplement, ENSURE ENLIVE, (ENSURE ENLIVE) LIQD Take 237 mLs by mouth daily. Patient not taking: Reported on 06/02/2017 07/13/16   Barton Dubois, MD  furosemide (LASIX) 20 MG tablet Take 20 mg by mouth daily.    [provider]  glucose blood (RELION GLUCOSE TEST STRIPS) test strip Test blood glucose 3 times a day. Dx Code 250.00 10/16/13   Collene Leyden, PA-C  glucose blood test strip Test blood sugar 3 times daily. Dx code: E11.9 04/22/14   Darlyne Russian, MD  HYDROcodone-acetaminophen (NORCO/VICODIN) 5-325 MG tablet Take 1 tablet by mouth every 4 (four) hours as needed. 08/04/17   Tanna Furry, MD  Lancets MISC Test blood sugar 3 times daily. Dx code: E11.9 04/22/14   Darlyne Russian, MD  lisinopril (ZESTRIL) 2.5 MG tablet Take 1 tablet (2.5 mg total) by mouth daily.  06/04/17 07/04/17  Florencia Reasons, MD  propranolol (INDERAL) 10 MG tablet Take 1 tablet (10 mg total) by mouth 2 (two) times daily. 06/04/17 07/04/17  Florencia Reasons, MD  spironolactone (ALDACTONE) 50 MG tablet Take 1 tablet (50 mg total) by mouth daily. 06/04/17   Florencia Reasons, MD  vitamin B-12 (CYANOCOBALAMIN) 1000 MCG tablet Take 1 tablet (1,000 mcg total) by mouth daily. 08/30/13   Rosalin Hawking, MD    Family History Family History  Problem Relation Age of Onset  . Stroke Mother   . Hyperlipidemia Father   . Cancer Brother        type unknown    Social History Social History   Tobacco Use  . Smoking status: Former Smoker    Packs/day: 0.50    Years: 20.00    Pack years: 10.00    Types: Cigarettes    Last  attempt to quit: 02/15/1975    Years since quitting: 42.4  . Smokeless tobacco: Never Used  Substance Use Topics  . Alcohol use: No  . Drug use: No     Allergies   Adhesive [tape] and Codeine   Review of Systems Review of Systems  Constitutional: Negative.  Negative for appetite change, chills, diaphoresis, fatigue and fever.       Denies weakness, dizziness, or other symptoms leading to his fall  HENT: Negative for mouth sores, sore throat and trouble swallowing.        Did not strike his head.  No headache.  No cervical spine pain  Eyes: Negative for visual disturbance.  Respiratory: Negative for cough, chest tightness, shortness of breath and wheezing.   Cardiovascular: Negative for chest pain.  Gastrointestinal: Negative for abdominal distention, abdominal pain, diarrhea, nausea and vomiting.  Endocrine: Negative for polydipsia, polyphagia and polyuria.  Genitourinary: Negative for dysuria, frequency and hematuria.  Musculoskeletal: Positive for back pain. Negative for gait problem.  Skin: Negative for color change, pallor and rash.  Neurological: Negative for dizziness, syncope, light-headedness and headaches.  Hematological: Does not bruise/bleed easily.  Psychiatric/Behavioral: Negative for behavioral problems and confusion.     Physical Exam Updated Vital Signs BP (!) 177/63   Pulse 65   Temp (!) 97.5 F (36.4 C) (Oral)   Resp (!) 33   Ht 6' (1.829 m)   Wt 80.3 kg (177 lb)   SpO2 98%   BMI 24.01 kg/m   Physical Exam  Constitutional: He is oriented to person, place, and time. He appears well-developed and well-nourished. No distress.  Elderly appearing.  Awake and alert.  HENT:  Head: Normocephalic.  Eyes: Pupils are equal, round, and reactive to light. Conjunctivae are normal. No scleral icterus.  Neck: Normal range of motion. Neck supple. No thyromegaly present.  Cardiovascular: Normal rate and regular rhythm. Exam reveals no gallop and no friction rub.    No murmur heard. Pulmonary/Chest: Effort normal and breath sounds normal. No respiratory distress. He has no wheezes. He has no rales.  Abdominal: Soft. Bowel sounds are normal. He exhibits no distension. There is no tenderness. There is no rebound.  Musculoskeletal: Normal range of motion.  Tenderness in his lumbar spine.  More tender in the paraspinal area in his low lumbar than he is high in the lumbar or lower thoracic region  Neurological: He is alert and oriented to person, place, and time.  Normal feeling and sensation in use of his lower extremities  Skin: Skin is warm and dry. No rash noted.  Psychiatric: He has a normal  mood and affect. His behavior is normal.     ED Treatments / Results  Labs (all labs ordered are listed, but only abnormal results are displayed) Labs Reviewed  CBC WITH DIFFERENTIAL/PLATELET - Abnormal; Notable for the following components:      Result Value   WBC 35.8 (*)    RBC 3.65 (*)    Hemoglobin 11.9 (*)    HCT 37.4 (*)    MCV 102.5 (*)    Lymphs Abs 30.4 (*)    All other components within normal limits  BASIC METABOLIC PANEL - Abnormal; Notable for the following components:   Sodium 134 (*)    Glucose, Bld 204 (*)    All other components within normal limits    EKG None  Radiology Dg Lumbar Spine Complete  Result Date: 08/04/2017 CLINICAL DATA:  Lower back pain after fall from home. EXAM: LUMBAR SPINE - COMPLETE 4+ VIEW COMPARISON:  CT scan of June 02, 2017. FINDINGS: Mild wedge compression deformity of L1 vertebral body is noted concerning for fracture of indeterminate age. Disc spaces are well-maintained. Anterior osteophyte formation is noted at L2-3 and L3-4. Atherosclerosis of abdominal aorta is noted. IMPRESSION: Mild wedge compression deformity of L1 vertebral body is noted concerning for fracture of indeterminate age. MRI may be performed for further evaluation. Aortic Atherosclerosis (ICD10-I70.0). Electronically Signed   By: Marijo Conception, M.D.   On: 08/04/2017 21:06   Dg Pelvis 1-2 Views  Result Date: 08/04/2017 CLINICAL DATA:  Bilateral hip pain after fall at home. EXAM: PELVIS - 1-2 VIEW COMPARISON:  None. FINDINGS: There is no evidence of pelvic fracture or diastasis. No pelvic bone lesions are seen. IMPRESSION: No abnormality seen in the pelvis or hips. Electronically Signed   By: Marijo Conception, M.D.   On: 08/04/2017 21:07    Procedures Procedures (including critical care time)  Medications Ordered in ED Medications  HYDROcodone-acetaminophen (NORCO/VICODIN) 5-325 MG per tablet 1 tablet (1 tablet Oral Given 08/04/17 2004)     Initial Impression / Assessment and Plan / ED Course  I have reviewed the triage vital signs and the nursing notes.  Pertinent labs & imaging results that were available during my care of the patient were reviewed by me and considered in my medical decision making (see chart for details).    Labs reassuring.  Unchanged WBC count consistent with his history of CLL.  Pelvis films normal.  Lumbar spine films show L1 compression.  This is new versus comparison of sagittal images of CT scan of the abdomen from April of this year.  Otherwise unknown acuity.  His tenderness is lower near his lower lumbar area.  However cannot with certainty say that this is not an acute compression fracture.  He was given a single hydrocodone his pain improved.  Was given a second.  He is here complete by family.  He has family at home.  I have recommended minimizing his activity.  We will arrange for visiting home health care and physical therapy.  Vicodin.  Primary care follow-up.  ER with worsening or other symptoms.  Final Clinical Impressions(s) / ED Diagnoses   Final diagnoses:  Closed compression fracture of first lumbar vertebra, initial encounter [facility])  Fall in home, initial encounter    ED Discharge Orders        Ordered    HYDROcodone-acetaminophen (NORCO/VICODIN) 5-325 MG tablet  Every 4 hours  PRN     08/04/17 2151    Home Health  08/04/17 2154    Face-to-face encounter (required for Medicare/Medicaid patients)    Comments:  I Lolita Patella certify that this patient is under my care and that I, or a nurse practitioner or physician's assistant working with me, had a face-to-face encounter that meets the physician face-to-face encounter requirements with this patient on 08/04/2017. The encounter with the patient was in whole, or in part for the following medical condition(s) which is the primary reason for home health care (List medical condition): Lumbar compression fracture   08/04/17 2154       Tanna Furry, MD 08/04/17 2158

## 2017-08-04 NOTE — ED Triage Notes (Signed)
Pt arrives from home, mechanical fall. Pt states he was bending down to pick something up and fell forward. Per [facility] bilateral hip pain, no shortening or rotation noted, could stand and bear weight. Bilateral pedal edema, baseline, evaluated yesterday at [facility] and no fluid was drawn off. Per [facility], pt's family states mobility has been declining.  Pt is on a blood thinner.  Pt reported SOB, 94% on RA, 100% on 2L 02 Paced rythym 50's-60's, hx AFIB  CBG for EMS 175. 140/72 BP

## 2017-08-07 LAB — PATHOLOGIST SMEAR REVIEW

## 2017-08-08 DIAGNOSIS — J181 Lobar pneumonia, unspecified organism: Secondary | ICD-10-CM | POA: Diagnosis not present

## 2017-08-08 DIAGNOSIS — I129 Hypertensive chronic kidney disease with stage 1 through stage 4 chronic kidney disease, or unspecified chronic kidney disease: Secondary | ICD-10-CM | POA: Diagnosis not present

## 2017-08-08 DIAGNOSIS — E1122 Type 2 diabetes mellitus with diabetic chronic kidney disease: Secondary | ICD-10-CM | POA: Diagnosis not present

## 2017-08-08 DIAGNOSIS — M1991 Primary osteoarthritis, unspecified site: Secondary | ICD-10-CM | POA: Diagnosis not present

## 2017-08-08 DIAGNOSIS — F0390 Unspecified dementia without behavioral disturbance: Secondary | ICD-10-CM | POA: Diagnosis not present

## 2017-08-08 DIAGNOSIS — N183 Chronic kidney disease, stage 3 (moderate): Secondary | ICD-10-CM | POA: Diagnosis not present

## 2017-08-10 DIAGNOSIS — I129 Hypertensive chronic kidney disease with stage 1 through stage 4 chronic kidney disease, or unspecified chronic kidney disease: Secondary | ICD-10-CM | POA: Diagnosis not present

## 2017-08-10 DIAGNOSIS — F0390 Unspecified dementia without behavioral disturbance: Secondary | ICD-10-CM | POA: Diagnosis not present

## 2017-08-10 DIAGNOSIS — E1122 Type 2 diabetes mellitus with diabetic chronic kidney disease: Secondary | ICD-10-CM | POA: Diagnosis not present

## 2017-08-10 DIAGNOSIS — M1991 Primary osteoarthritis, unspecified site: Secondary | ICD-10-CM | POA: Diagnosis not present

## 2017-08-10 DIAGNOSIS — N183 Chronic kidney disease, stage 3 (moderate): Secondary | ICD-10-CM | POA: Diagnosis not present

## 2017-08-10 DIAGNOSIS — J181 Lobar pneumonia, unspecified organism: Secondary | ICD-10-CM | POA: Diagnosis not present

## 2017-08-15 DIAGNOSIS — E1122 Type 2 diabetes mellitus with diabetic chronic kidney disease: Secondary | ICD-10-CM | POA: Diagnosis not present

## 2017-08-15 DIAGNOSIS — R7309 Other abnormal glucose: Secondary | ICD-10-CM | POA: Diagnosis not present

## 2017-08-15 DIAGNOSIS — F0390 Unspecified dementia without behavioral disturbance: Secondary | ICD-10-CM | POA: Diagnosis not present

## 2017-08-15 DIAGNOSIS — J181 Lobar pneumonia, unspecified organism: Secondary | ICD-10-CM | POA: Diagnosis not present

## 2017-08-15 DIAGNOSIS — I129 Hypertensive chronic kidney disease with stage 1 through stage 4 chronic kidney disease, or unspecified chronic kidney disease: Secondary | ICD-10-CM | POA: Diagnosis not present

## 2017-08-15 DIAGNOSIS — N183 Chronic kidney disease, stage 3 (moderate): Secondary | ICD-10-CM | POA: Diagnosis not present

## 2017-08-15 DIAGNOSIS — R6889 Other general symptoms and signs: Secondary | ICD-10-CM | POA: Diagnosis not present

## 2017-08-15 DIAGNOSIS — M1991 Primary osteoarthritis, unspecified site: Secondary | ICD-10-CM | POA: Diagnosis not present

## 2017-08-15 DIAGNOSIS — E785 Hyperlipidemia, unspecified: Secondary | ICD-10-CM | POA: Diagnosis not present

## 2017-08-15 DIAGNOSIS — R7989 Other specified abnormal findings of blood chemistry: Secondary | ICD-10-CM | POA: Diagnosis not present

## 2017-08-17 DIAGNOSIS — I129 Hypertensive chronic kidney disease with stage 1 through stage 4 chronic kidney disease, or unspecified chronic kidney disease: Secondary | ICD-10-CM | POA: Diagnosis not present

## 2017-08-17 DIAGNOSIS — J181 Lobar pneumonia, unspecified organism: Secondary | ICD-10-CM | POA: Diagnosis not present

## 2017-08-17 DIAGNOSIS — N183 Chronic kidney disease, stage 3 (moderate): Secondary | ICD-10-CM | POA: Diagnosis not present

## 2017-08-17 DIAGNOSIS — E1122 Type 2 diabetes mellitus with diabetic chronic kidney disease: Secondary | ICD-10-CM | POA: Diagnosis not present

## 2017-08-17 DIAGNOSIS — F0390 Unspecified dementia without behavioral disturbance: Secondary | ICD-10-CM | POA: Diagnosis not present

## 2017-08-17 DIAGNOSIS — M1991 Primary osteoarthritis, unspecified site: Secondary | ICD-10-CM | POA: Diagnosis not present

## 2017-08-22 DIAGNOSIS — N183 Chronic kidney disease, stage 3 (moderate): Secondary | ICD-10-CM | POA: Diagnosis not present

## 2017-08-22 DIAGNOSIS — I129 Hypertensive chronic kidney disease with stage 1 through stage 4 chronic kidney disease, or unspecified chronic kidney disease: Secondary | ICD-10-CM | POA: Diagnosis not present

## 2017-08-22 DIAGNOSIS — M1991 Primary osteoarthritis, unspecified site: Secondary | ICD-10-CM | POA: Diagnosis not present

## 2017-08-22 DIAGNOSIS — E1122 Type 2 diabetes mellitus with diabetic chronic kidney disease: Secondary | ICD-10-CM | POA: Diagnosis not present

## 2017-08-22 DIAGNOSIS — F0390 Unspecified dementia without behavioral disturbance: Secondary | ICD-10-CM | POA: Diagnosis not present

## 2017-08-22 DIAGNOSIS — J181 Lobar pneumonia, unspecified organism: Secondary | ICD-10-CM | POA: Diagnosis not present

## 2017-08-24 DIAGNOSIS — J181 Lobar pneumonia, unspecified organism: Secondary | ICD-10-CM | POA: Diagnosis not present

## 2017-08-24 DIAGNOSIS — E1122 Type 2 diabetes mellitus with diabetic chronic kidney disease: Secondary | ICD-10-CM | POA: Diagnosis not present

## 2017-08-24 DIAGNOSIS — M1991 Primary osteoarthritis, unspecified site: Secondary | ICD-10-CM | POA: Diagnosis not present

## 2017-08-24 DIAGNOSIS — I129 Hypertensive chronic kidney disease with stage 1 through stage 4 chronic kidney disease, or unspecified chronic kidney disease: Secondary | ICD-10-CM | POA: Diagnosis not present

## 2017-08-24 DIAGNOSIS — F0390 Unspecified dementia without behavioral disturbance: Secondary | ICD-10-CM | POA: Diagnosis not present

## 2017-08-24 DIAGNOSIS — N183 Chronic kidney disease, stage 3 (moderate): Secondary | ICD-10-CM | POA: Diagnosis not present

## 2017-08-28 DIAGNOSIS — M1991 Primary osteoarthritis, unspecified site: Secondary | ICD-10-CM | POA: Diagnosis not present

## 2017-08-28 DIAGNOSIS — F0390 Unspecified dementia without behavioral disturbance: Secondary | ICD-10-CM | POA: Diagnosis not present

## 2017-08-28 DIAGNOSIS — N183 Chronic kidney disease, stage 3 (moderate): Secondary | ICD-10-CM | POA: Diagnosis not present

## 2017-08-28 DIAGNOSIS — J181 Lobar pneumonia, unspecified organism: Secondary | ICD-10-CM | POA: Diagnosis not present

## 2017-08-28 DIAGNOSIS — E1122 Type 2 diabetes mellitus with diabetic chronic kidney disease: Secondary | ICD-10-CM | POA: Diagnosis not present

## 2017-08-28 DIAGNOSIS — I129 Hypertensive chronic kidney disease with stage 1 through stage 4 chronic kidney disease, or unspecified chronic kidney disease: Secondary | ICD-10-CM | POA: Diagnosis not present

## 2017-08-29 ENCOUNTER — Emergency Department (HOSPITAL_COMMUNITY)
Admission: EM | Admit: 2017-08-29 | Discharge: 2017-08-30 | Disposition: A | Discharge status: Home / Self Care | Payer: Medicare HMO | Attending: Emergency Medicine | Admitting: Emergency Medicine

## 2017-08-29 ENCOUNTER — Encounter (HOSPITAL_COMMUNITY): Payer: Self-pay

## 2017-08-29 DIAGNOSIS — R630 Anorexia: Secondary | ICD-10-CM

## 2017-08-29 DIAGNOSIS — Z87891 Personal history of nicotine dependence: Secondary | ICD-10-CM | POA: Diagnosis not present

## 2017-08-29 DIAGNOSIS — N179 Acute kidney failure, unspecified: Secondary | ICD-10-CM

## 2017-08-29 DIAGNOSIS — I10 Essential (primary) hypertension: Secondary | ICD-10-CM | POA: Insufficient documentation

## 2017-08-29 DIAGNOSIS — E119 Type 2 diabetes mellitus without complications: Secondary | ICD-10-CM | POA: Diagnosis not present

## 2017-08-29 DIAGNOSIS — Z7902 Long term (current) use of antithrombotics/antiplatelets: Secondary | ICD-10-CM | POA: Insufficient documentation

## 2017-08-29 DIAGNOSIS — Z79899 Other long term (current) drug therapy: Secondary | ICD-10-CM | POA: Diagnosis not present

## 2017-08-29 DIAGNOSIS — Z85828 Personal history of other malignant neoplasm of skin: Secondary | ICD-10-CM | POA: Diagnosis not present

## 2017-08-29 DIAGNOSIS — R197 Diarrhea, unspecified: Secondary | ICD-10-CM | POA: Diagnosis not present

## 2017-08-29 DIAGNOSIS — E860 Dehydration: Secondary | ICD-10-CM | POA: Diagnosis not present

## 2017-08-29 DIAGNOSIS — Z8546 Personal history of malignant neoplasm of prostate: Secondary | ICD-10-CM | POA: Insufficient documentation

## 2017-08-29 DIAGNOSIS — Z8673 Personal history of transient ischemic attack (TIA), and cerebral infarction without residual deficits: Secondary | ICD-10-CM | POA: Diagnosis not present

## 2017-08-29 DIAGNOSIS — R638 Other symptoms and signs concerning food and fluid intake: Secondary | ICD-10-CM | POA: Diagnosis not present

## 2017-08-29 NOTE — ED Triage Notes (Signed)
Pt is stage 4 cirrhosis cancer patient and he's very dehydrated, has lost weight in the past several days He's had diarrhea also He also has swelling in his back

## 2017-08-30 LAB — AMMONIA: Ammonia: 22 umol/L (ref 9–35)

## 2017-08-30 LAB — CBC WITH DIFFERENTIAL/PLATELET
BASOS ABS: 0 10*3/uL (ref 0.0–0.1)
Basophils Relative: 0 %
EOS PCT: 0 %
Eosinophils Absolute: 0 10*3/uL (ref 0.0–0.7)
HCT: 35.2 % — ABNORMAL LOW (ref 39.0–52.0)
HEMOGLOBIN: 11.7 g/dL — AB (ref 13.0–17.0)
LYMPHS PCT: 84 %
Lymphs Abs: 32.8 10*3/uL — ABNORMAL HIGH (ref 0.7–4.0)
MCH: 32.5 pg (ref 26.0–34.0)
MCHC: 33.2 g/dL (ref 30.0–36.0)
MCV: 97.8 fL (ref 78.0–100.0)
MONOS PCT: 1 %
Monocytes Absolute: 0.4 10*3/uL (ref 0.1–1.0)
NEUTROS ABS: 5.9 10*3/uL (ref 1.7–7.7)
Neutrophils Relative %: 15 %
Platelets: 186 10*3/uL (ref 150–400)
RBC: 3.6 MIL/uL — AB (ref 4.22–5.81)
RDW: 14.1 % (ref 11.5–15.5)
WBC: 39.1 10*3/uL — ABNORMAL HIGH (ref 4.0–10.5)

## 2017-08-30 LAB — COMPREHENSIVE METABOLIC PANEL
ALBUMIN: 3.4 g/dL — AB (ref 3.5–5.0)
ALT: 11 U/L (ref 0–44)
ANION GAP: 9 (ref 5–15)
AST: 15 U/L (ref 15–41)
Alkaline Phosphatase: 82 U/L (ref 38–126)
BUN: 42 mg/dL — AB (ref 8–23)
CHLORIDE: 104 mmol/L (ref 98–111)
CO2: 24 mmol/L (ref 22–32)
Calcium: 9.5 mg/dL (ref 8.9–10.3)
Creatinine, Ser: 1.9 mg/dL — ABNORMAL HIGH (ref 0.61–1.24)
GFR calc Af Amer: 35 mL/min — ABNORMAL LOW (ref >60)
GFR calc non Af Amer: 30 mL/min — ABNORMAL LOW (ref >60)
GLUCOSE: 149 mg/dL — AB (ref 70–99)
POTASSIUM: 4.1 mmol/L (ref 3.5–5.1)
Sodium: 137 mmol/L (ref 135–145)
TOTAL PROTEIN: 6.2 g/dL — AB (ref 6.5–8.1)
Total Bilirubin: 0.7 mg/dL (ref 0.3–1.2)

## 2017-08-30 LAB — URINALYSIS, ROUTINE W REFLEX MICROSCOPIC
BILIRUBIN URINE: NEGATIVE
GLUCOSE, UA: NEGATIVE mg/dL
HGB URINE DIPSTICK: NEGATIVE
KETONES UR: NEGATIVE mg/dL
Leukocytes, UA: NEGATIVE
NITRITE: NEGATIVE
Protein, ur: NEGATIVE mg/dL
SPECIFIC GRAVITY, URINE: 1.011 (ref 1.005–1.030)
pH: 5 (ref 5.0–8.0)

## 2017-08-30 LAB — I-STAT CG4 LACTIC ACID, ED
Lactic Acid, Venous: 1.2 mmol/L (ref 0.5–1.9)
Lactic Acid, Venous: 1.48 mmol/L (ref 0.5–1.9)

## 2017-08-30 MED ORDER — IBUPROFEN 200 MG PO TABS
400.0000 mg | ORAL_TABLET | Freq: Once | ORAL | Status: AC
  Administered 2017-08-30: 400 mg via ORAL
  Filled 2017-08-30: qty 2

## 2017-08-30 MED ORDER — SODIUM CHLORIDE 0.9 % IV BOLUS
500.0000 mL | Freq: Once | INTRAVENOUS | Status: AC
  Administered 2017-08-30: 500 mL via INTRAVENOUS

## 2017-08-30 MED ORDER — SODIUM CHLORIDE 0.9 % IV BOLUS
1000.0000 mL | Freq: Once | INTRAVENOUS | Status: AC
  Administered 2017-08-30: 1000 mL via INTRAVENOUS

## 2017-08-30 NOTE — ED Notes (Signed)
Pt aware that urine sample is needed.  

## 2017-08-30 NOTE — ED Provider Notes (Signed)
[facility] DEPT Provider Note  CSN: [id number] Arrival date & time: 08/29/17 2052  Chief Complaint(s) swelling in back  HPI Brandon Chambers is a 84 y.o. male   The history is provided by a relative.  Diarrhea   This is a new problem. The current episode started 2 days ago. The problem occurs 5 to 10 times per day. The stool consistency is described as watery. There has been no fever. Pertinent negatives include no abdominal pain, no URI and no cough. He has tried nothing for the symptoms. Past medical history comments: cirrhosis on Rifaxamim.   Family reports that the patient has had decreased appetite for the past several months.  They believe that he is dehydrated.  Family also reports that the patient is talking out of his head and is mildly confused.  This is been ongoing for about 1 week.   Past Medical History Past Medical History:  Diagnosis Date  . Arthritis   . CLL (chronic lymphocytic leukemia) ([place])   . Diabetes mellitus ([place])   . Hypercholesteremia   . Hypertension   . Prostate cancer ([place])   . Skin cancer (melanoma) ([place])   . SOB (shortness of breath)   . Stroke [facility])    Patient Active Problem List   Diagnosis Date Noted  . Pneumonia 06/03/2017  . Other ascites   . Community acquired pneumonia of right lower lobe of lung ([place]) 06/02/2017  . Cellulitis of left leg 07/15/2016  . UTI (urinary tract infection) 07/12/2016  . Acute lower UTI 07/11/2016  . Hyponatremia 07/11/2016  . Acute metabolic encephalopathy 40/98/1191  . AKI (acute kidney injury) ([place]) 07/11/2016  . Dehydration 07/11/2016  . Thrombocytopenia ([place]) 07/11/2016  . QT prolongation 07/11/2016  . HLD (hyperlipidemia) 12/02/2013  . B12 deficiency 12/02/2013  . Prostate cancer ([place]) 09/04/2012  . CLL (chronic lymphocytic leukemia) ([place]) 03/05/2012  . Anticoagulated on Coumadin 03/05/2012  . CVA (cerebral vascular accident) ([place]) 03/05/2012  . AV block  10/21/2011  . Diabetes mellitus ([place])   . Hypercholesteremia   . Hypertension   . SOB (shortness of breath)    Home Medication(s) Prior to Admission medications   Medication Sig Start Date End Date Taking? Authorizing Provider  atorvastatin (LIPITOR) 40 MG tablet TAKE 1 TABLET EVERY DAY Patient taking differently: TAKE 1 TABLET BY MOUTH EVERY DAY 10/23/14  Yes Rosalin Hawking, MD  furosemide (LASIX) 20 MG tablet Take 20 mg by mouth daily.   Yes [provider]  HYDROcodone-acetaminophen (NORCO/VICODIN) 5-325 MG tablet Take 1 tablet by mouth every 4 (four) hours as needed. Patient taking differently: Take 1 tablet by mouth at bedtime.  08/04/17  Yes Tanna Furry, MD  ibuprofen (ADVIL,MOTRIN) 200 MG tablet Take 600 mg by mouth every 6 (six) hours as needed for moderate pain.   Yes [provider]  lisinopril (ZESTRIL) 2.5 MG tablet Take 1 tablet (2.5 mg total) by mouth daily. 06/04/17 08/30/17 Yes Florencia Reasons, MD  propranolol (INDERAL) 10 MG tablet Take 1 tablet (10 mg total) by mouth 2 (two) times daily. 06/04/17 08/30/17 Yes Florencia Reasons, MD  spironolactone (ALDACTONE) 50 MG tablet Take 1 tablet (50 mg total) by mouth daily. 06/04/17  Yes Florencia Reasons, MD  blood glucose meter kit and supplies KIT Test blood sugar 3 times daily. Dx code: E11.9 04/22/14   Darlyne Russian, MD  clopidogrel (PLAVIX) 75 MG tablet TAKE 1 TABLET EVERY DAY 10/23/14   Rosalin Hawking, MD  feeding supplement, ENSURE ENLIVE, (  ENSURE ENLIVE) LIQD Take 237 mLs by mouth daily. Patient not taking: Reported on 06/02/2017 07/13/16   Barton Dubois, MD  glucose blood (RELION GLUCOSE TEST STRIPS) test strip Test blood glucose 3 times a day. Dx Code 250.00 10/16/13   Collene Leyden, PA-C  glucose blood test strip Test blood sugar 3 times daily. Dx code: E11.9 04/22/14   Darlyne Russian, MD  Lancets MISC Test blood sugar 3 times daily. Dx code: E11.9 04/22/14   Darlyne Russian, MD  vitamin B-12 (CYANOCOBALAMIN) 1000 MCG tablet Take 1 tablet (1,000 mcg  total) by mouth daily. Patient not taking: Reported on 08/30/2017 08/30/13   Rosalin Hawking, MD                                                                                                                                    Past Surgical History Past Surgical History:  Procedure Laterality Date  . BYPASS GRAFT Left    leg  . CATARACT EXTRACTION Bilateral   . PROSTATE SURGERY     Family History Family History  Problem Relation Age of Onset  . Stroke Mother   . Hyperlipidemia Father   . Cancer Brother        type unknown    Social History Social History   Tobacco Use  . Smoking status: Former Smoker    Packs/day: 0.50    Years: 20.00    Pack years: 10.00    Types: Cigarettes    Last attempt to quit: 02/15/1975    Years since quitting: 42.5  . Smokeless tobacco: Never Used  Substance Use Topics  . Alcohol use: No  . Drug use: No   Allergies Adhesive [tape] and Codeine  Review of Systems Review of Systems  Respiratory: Negative for cough.   Gastrointestinal: Positive for diarrhea. Negative for abdominal pain.  All other systems are reviewed and are negative for acute change except as noted in the HPI   Physical Exam Vital Signs  I have reviewed the triage vital signs BP (!) 142/73   Pulse (!) 117   Temp (!) 97.5 F (36.4 C) (Oral)   Resp 17   Ht '5\' 11"'$  (1.803 m)   Wt 74.4 kg (164 lb)   SpO2 99%   BMI 22.87 kg/m   Physical Exam  Constitutional: He is oriented to person, place, and time. He appears well-developed and well-nourished. No distress.  HENT:  Head: Normocephalic and atraumatic.  Nose: Nose normal.  Mouth/Throat: Mucous membranes are dry.  Eyes: Pupils are equal, round, and reactive to light. Conjunctivae and EOM are normal. Right eye exhibits no discharge. Left eye exhibits no discharge. No scleral icterus.  Neck: Normal range of motion. Neck supple.  Cardiovascular: Normal rate and regular rhythm. Exam reveals no gallop and no friction rub.  No  murmur heard. Pulmonary/Chest: Effort normal and breath sounds normal. No stridor. No respiratory distress. He has no  rales.  Abdominal: Soft. He exhibits no distension. There is no tenderness.  Musculoskeletal: He exhibits no edema or tenderness.  Neurological: He is alert and oriented to person, place, and time.  Skin: Skin is warm and dry. No rash noted. He is not diaphoretic. No erythema.  Psychiatric: He has a normal mood and affect.  Vitals reviewed.   ED Results and Treatments Labs (all labs ordered are listed, but only abnormal results are displayed) Labs Reviewed  COMPREHENSIVE METABOLIC PANEL - Abnormal; Notable for the following components:      Result Value   Glucose, Bld 149 (*)    BUN 42 (*)    Creatinine, Ser 1.90 (*)    Total Protein 6.2 (*)    Albumin 3.4 (*)    GFR calc non Af Amer 30 (*)    GFR calc Af Amer 35 (*)    All other components within normal limits  CBC WITH DIFFERENTIAL/PLATELET - Abnormal; Notable for the following components:   WBC 39.1 (*)    RBC 3.60 (*)    Hemoglobin 11.7 (*)    HCT 35.2 (*)    Lymphs Abs 32.8 (*)    All other components within normal limits  URINALYSIS, ROUTINE W REFLEX MICROSCOPIC  AMMONIA  I-STAT CG4 LACTIC ACID, ED  I-STAT CG4 LACTIC ACID, ED                                                                                                                         EKG  EKG Interpretation  Date/Time:  Wednesday August 30 2017 01:40:34 EDT Ventricular Rate:  69 PR Interval:    QRS Duration: 112 QT Interval:  454 QTC Calculation: 547 R Axis:   -34 Text Interpretation:  A-V dual-paced complexes w/ some inhibition No further analysis attempted due to paced rhythm Baseline wander in lead(s) II III aVF Otherwise no significant change Confirmed by Addison Lank ([id number]) on 08/30/2017 2:09:09 AM      Radiology No results found. Pertinent labs & imaging results that were available during my care of the patient were reviewed by  me and considered in my medical decision making (see chart for details).  Medications Ordered in ED Medications  sodium chloride 0.9 % bolus 1,000 mL (0 mLs Intravenous Stopped 08/30/17 0300)  sodium chloride 0.9 % bolus 500 mL (0 mLs Intravenous Stopped 08/30/17 0616)  ibuprofen (ADVIL,MOTRIN) tablet 400 mg (400 mg Oral Given 08/30/17 0649)  Procedures Procedures  (including critical care time)  Medical Decision Making / ED Course I have reviewed the nursing notes for this encounter and the patient's prior records (if available in EHR or on provided paperwork).    Patient is afebrile mildly tachycardic normotensive.  Clinical evidence of dehydration noted.  Abdomen benign.  Oriented x3 but does  appear to be mildly confused.  No recent trauma or headache suspicious for ICH.  Ammonia normal.  Screening labs with stable leukocytosis.  No elevated lactic acid.  No significant electrolyte derangements.  Mild renal insufficiency noted.   Patient provided with IV fluids.  Patient also given oral hydration which she is able to tolerate.  Tachycardia improved.   UA negative for infectious process.  Dehydration likely secondary to decreased oral hydration and recent diarrhea.  Patient has not had an episode of diarrhea for the past 12 hours.  The patient appears reasonably screened and/or stabilized for discharge and I doubt any other medical condition or other [facility] requiring further screening, evaluation, or treatment in the ED at this time prior to discharge.  The patient is safe for discharge with strict return precautions.   Final Clinical Impression(s) / ED Diagnoses Final diagnoses:  Dehydration  Decreased appetite  AKI (acute kidney injury) ([place])   Disposition: Discharge  Condition: Good  I have discussed the results, Dx and Tx plan with the patient  and family who expressed understanding and agree(s) with the plan. Discharge instructions discussed at great length. The patient and family was given strict return precautions who verbalized understanding of the instructions. No further questions at time of discharge.    ED Discharge Orders    None       Follow Up: Charlsie Merles, [address] [place] [address] [phone]  Schedule an appointment as soon as possible for a visit  in 3-5 days for repeat renal function test      This chart was dictated using voice recognition software.  Despite best efforts to proofread,  errors can occur which can change the documentation meaning.   Fatima Blank, MD 08/30/17 [phone]

## 2017-08-31 DIAGNOSIS — I129 Hypertensive chronic kidney disease with stage 1 through stage 4 chronic kidney disease, or unspecified chronic kidney disease: Secondary | ICD-10-CM | POA: Diagnosis not present

## 2017-08-31 DIAGNOSIS — E1122 Type 2 diabetes mellitus with diabetic chronic kidney disease: Secondary | ICD-10-CM | POA: Diagnosis not present

## 2017-08-31 DIAGNOSIS — M1991 Primary osteoarthritis, unspecified site: Secondary | ICD-10-CM | POA: Diagnosis not present

## 2017-08-31 DIAGNOSIS — N183 Chronic kidney disease, stage 3 (moderate): Secondary | ICD-10-CM | POA: Diagnosis not present

## 2017-08-31 DIAGNOSIS — J181 Lobar pneumonia, unspecified organism: Secondary | ICD-10-CM | POA: Diagnosis not present

## 2017-08-31 DIAGNOSIS — F0390 Unspecified dementia without behavioral disturbance: Secondary | ICD-10-CM | POA: Diagnosis not present

## 2017-09-05 DIAGNOSIS — E1122 Type 2 diabetes mellitus with diabetic chronic kidney disease: Secondary | ICD-10-CM | POA: Diagnosis not present

## 2017-09-05 DIAGNOSIS — M1991 Primary osteoarthritis, unspecified site: Secondary | ICD-10-CM | POA: Diagnosis not present

## 2017-09-05 DIAGNOSIS — F0390 Unspecified dementia without behavioral disturbance: Secondary | ICD-10-CM | POA: Diagnosis not present

## 2017-09-05 DIAGNOSIS — N183 Chronic kidney disease, stage 3 (moderate): Secondary | ICD-10-CM | POA: Diagnosis not present

## 2017-09-05 DIAGNOSIS — I129 Hypertensive chronic kidney disease with stage 1 through stage 4 chronic kidney disease, or unspecified chronic kidney disease: Secondary | ICD-10-CM | POA: Diagnosis not present

## 2017-09-05 DIAGNOSIS — J181 Lobar pneumonia, unspecified organism: Secondary | ICD-10-CM | POA: Diagnosis not present
